# Patient Record
Sex: Male | Born: 1960 | Race: White | Hispanic: No
Health system: Southern US, Community
[De-identification: ages and names within clinical notes are randomized; demographics above are authoritative.]

## PROBLEM LIST (undated history)

## (undated) DIAGNOSIS — I4891 Unspecified atrial fibrillation: Secondary | ICD-10-CM

## (undated) DIAGNOSIS — F17200 Nicotine dependence, unspecified, uncomplicated: Secondary | ICD-10-CM

## (undated) DIAGNOSIS — I10 Essential (primary) hypertension: Secondary | ICD-10-CM

---

## 2019-11-04 ENCOUNTER — Emergency Department (HOSPITAL_COMMUNITY)

## 2019-11-04 ENCOUNTER — Inpatient Hospital Stay (HOSPITAL_COMMUNITY)

## 2019-11-04 ENCOUNTER — Encounter (HOSPITAL_COMMUNITY)
Admission: EM | Disposition: A | Discharge status: Rehabilitation Facility | Payer: Self-pay | Source: Home / Self Care | Attending: Neurology

## 2019-11-04 ENCOUNTER — Emergency Department (HOSPITAL_COMMUNITY): Admitting: Registered Nurse

## 2019-11-04 ENCOUNTER — Encounter (HOSPITAL_COMMUNITY): Payer: Self-pay | Admitting: Emergency Medicine

## 2019-11-04 ENCOUNTER — Other Ambulatory Visit: Payer: Self-pay

## 2019-11-04 ENCOUNTER — Inpatient Hospital Stay (HOSPITAL_COMMUNITY)
Admission: EM | Admit: 2019-11-04 | Discharge: 2019-11-15 | DRG: 023 | Disposition: A | Discharge status: Rehabilitation Facility | Attending: Neurology | Admitting: Neurology

## 2019-11-04 DIAGNOSIS — M549 Dorsalgia, unspecified: Secondary | ICD-10-CM | POA: Diagnosis present

## 2019-11-04 DIAGNOSIS — Z716 Tobacco abuse counseling: Secondary | ICD-10-CM | POA: Diagnosis not present

## 2019-11-04 DIAGNOSIS — G5710 Meralgia paresthetica, unspecified lower limb: Secondary | ICD-10-CM | POA: Diagnosis present

## 2019-11-04 DIAGNOSIS — R471 Dysarthria and anarthria: Secondary | ICD-10-CM | POA: Diagnosis present

## 2019-11-04 DIAGNOSIS — I10 Essential (primary) hypertension: Secondary | ICD-10-CM

## 2019-11-04 DIAGNOSIS — H04123 Dry eye syndrome of bilateral lacrimal glands: Secondary | ICD-10-CM | POA: Diagnosis not present

## 2019-11-04 DIAGNOSIS — Z20822 Contact with and (suspected) exposure to covid-19: Secondary | ICD-10-CM | POA: Diagnosis not present

## 2019-11-04 DIAGNOSIS — I4819 Other persistent atrial fibrillation: Secondary | ICD-10-CM | POA: Diagnosis present

## 2019-11-04 DIAGNOSIS — R29714 NIHSS score 14: Secondary | ICD-10-CM | POA: Diagnosis present

## 2019-11-04 DIAGNOSIS — R531 Weakness: Secondary | ICD-10-CM | POA: Diagnosis not present

## 2019-11-04 DIAGNOSIS — I6389 Other cerebral infarction: Secondary | ICD-10-CM | POA: Diagnosis not present

## 2019-11-04 DIAGNOSIS — F1010 Alcohol abuse, uncomplicated: Secondary | ICD-10-CM | POA: Diagnosis not present

## 2019-11-04 DIAGNOSIS — E7800 Pure hypercholesterolemia, unspecified: Secondary | ICD-10-CM | POA: Diagnosis not present

## 2019-11-04 DIAGNOSIS — R29810 Facial weakness: Secondary | ICD-10-CM | POA: Diagnosis present

## 2019-11-04 DIAGNOSIS — F10239 Alcohol dependence with withdrawal, unspecified: Secondary | ICD-10-CM | POA: Diagnosis not present

## 2019-11-04 DIAGNOSIS — I639 Cerebral infarction, unspecified: Secondary | ICD-10-CM | POA: Diagnosis not present

## 2019-11-04 DIAGNOSIS — I63431 Cerebral infarction due to embolism of right posterior cerebral artery: Principal | ICD-10-CM | POA: Diagnosis present

## 2019-11-04 DIAGNOSIS — E669 Obesity, unspecified: Secondary | ICD-10-CM | POA: Diagnosis present

## 2019-11-04 DIAGNOSIS — I63541 Cerebral infarction due to unspecified occlusion or stenosis of right cerebellar artery: Secondary | ICD-10-CM | POA: Diagnosis not present

## 2019-11-04 DIAGNOSIS — F10129 Alcohol abuse with intoxication, unspecified: Secondary | ICD-10-CM

## 2019-11-04 DIAGNOSIS — I609 Nontraumatic subarachnoid hemorrhage, unspecified: Secondary | ICD-10-CM | POA: Diagnosis not present

## 2019-11-04 DIAGNOSIS — I63531 Cerebral infarction due to unspecified occlusion or stenosis of right posterior cerebral artery: Secondary | ICD-10-CM

## 2019-11-04 DIAGNOSIS — I480 Paroxysmal atrial fibrillation: Secondary | ICD-10-CM | POA: Diagnosis not present

## 2019-11-04 DIAGNOSIS — R1312 Dysphagia, oropharyngeal phase: Secondary | ICD-10-CM | POA: Diagnosis not present

## 2019-11-04 DIAGNOSIS — G8194 Hemiplegia, unspecified affecting left nondominant side: Secondary | ICD-10-CM | POA: Diagnosis present

## 2019-11-04 DIAGNOSIS — R400 Somnolence: Secondary | ICD-10-CM | POA: Diagnosis not present

## 2019-11-04 DIAGNOSIS — R1310 Dysphagia, unspecified: Secondary | ICD-10-CM | POA: Diagnosis not present

## 2019-11-04 DIAGNOSIS — H53462 Homonymous bilateral field defects, left side: Secondary | ICD-10-CM | POA: Diagnosis present

## 2019-11-04 DIAGNOSIS — G9511 Acute infarction of spinal cord (embolic) (nonembolic): Secondary | ICD-10-CM | POA: Diagnosis not present

## 2019-11-04 DIAGNOSIS — I771 Stricture of artery: Secondary | ICD-10-CM | POA: Diagnosis not present

## 2019-11-04 DIAGNOSIS — I63211 Cerebral infarction due to unspecified occlusion or stenosis of right vertebral arteries: Secondary | ICD-10-CM | POA: Diagnosis not present

## 2019-11-04 DIAGNOSIS — I959 Hypotension, unspecified: Secondary | ICD-10-CM | POA: Diagnosis present

## 2019-11-04 DIAGNOSIS — R4781 Slurred speech: Secondary | ICD-10-CM | POA: Diagnosis present

## 2019-11-04 DIAGNOSIS — Z6832 Body mass index (BMI) 32.0-32.9, adult: Secondary | ICD-10-CM

## 2019-11-04 DIAGNOSIS — I9589 Other hypotension: Secondary | ICD-10-CM | POA: Diagnosis not present

## 2019-11-04 DIAGNOSIS — I119 Hypertensive heart disease without heart failure: Secondary | ICD-10-CM | POA: Diagnosis not present

## 2019-11-04 DIAGNOSIS — G8191 Hemiplegia, unspecified affecting right dominant side: Secondary | ICD-10-CM | POA: Diagnosis not present

## 2019-11-04 DIAGNOSIS — H5340 Unspecified visual field defects: Secondary | ICD-10-CM | POA: Diagnosis not present

## 2019-11-04 DIAGNOSIS — F17200 Nicotine dependence, unspecified, uncomplicated: Secondary | ICD-10-CM | POA: Diagnosis not present

## 2019-11-04 DIAGNOSIS — I358 Other nonrheumatic aortic valve disorders: Secondary | ICD-10-CM | POA: Diagnosis present

## 2019-11-04 DIAGNOSIS — I63433 Cerebral infarction due to embolism of bilateral posterior cerebral arteries: Secondary | ICD-10-CM | POA: Diagnosis not present

## 2019-11-04 HISTORY — DX: Unspecified atrial fibrillation: I48.91

## 2019-11-04 HISTORY — PX: IR US GUIDE VASC ACCESS RIGHT: IMG2390

## 2019-11-04 HISTORY — DX: Essential (primary) hypertension: I10

## 2019-11-04 HISTORY — PX: IR CT HEAD LTD: IMG2386

## 2019-11-04 HISTORY — DX: Cerebral infarction, unspecified: I63.9

## 2019-11-04 HISTORY — PX: RADIOLOGY WITH ANESTHESIA: SHX6223

## 2019-11-04 HISTORY — PX: IR PERCUTANEOUS ART THROMBECTOMY/INFUSION INTRACRANIAL INC DIAG ANGIO: IMG6087

## 2019-11-04 LAB — PROTIME-INR
INR: 1 (ref 0.8–1.2)
Prothrombin Time: 13.1 seconds (ref 11.4–15.2)

## 2019-11-04 LAB — GLUCOSE, CAPILLARY
Glucose-Capillary: 135 mg/dL — ABNORMAL HIGH (ref 70–99)
Glucose-Capillary: 142 mg/dL — ABNORMAL HIGH (ref 70–99)
Glucose-Capillary: 122 mg/dL — ABNORMAL HIGH (ref 70–99)

## 2019-11-04 LAB — COMPREHENSIVE METABOLIC PANEL
ALT: 41 U/L (ref 0–44)
AST: 24 U/L (ref 15–41)
Albumin: 3.9 g/dL (ref 3.5–5.0)
Alkaline Phosphatase: 72 U/L (ref 38–126)
Anion gap: 8 (ref 5–15)
BUN: 8 mg/dL (ref 6–20)
CO2: 24 mmol/L (ref 22–32)
Calcium: 9.3 mg/dL (ref 8.9–10.3)
Chloride: 103 mmol/L (ref 98–111)
Creatinine, Ser: 0.85 mg/dL (ref 0.61–1.24)
GFR calc Af Amer: >60 mL/min (ref >60)
GFR calc non Af Amer: >60 mL/min (ref >60)
Glucose, Bld: 112 mg/dL — ABNORMAL HIGH (ref 70–99)
Potassium: 4.4 mmol/L (ref 3.5–5.1)
Sodium: 135 mmol/L (ref 135–145)
Total Bilirubin: 0.8 mg/dL (ref 0.3–1.2)
Total Protein: 7.1 g/dL (ref 6.5–8.1)

## 2019-11-04 LAB — DIFFERENTIAL
Abs Immature Granulocytes: 0.02 10*3/uL (ref 0.00–0.07)
Basophils Absolute: 0 10*3/uL (ref 0.0–0.1)
Basophils Relative: 1 %
Eosinophils Absolute: 0.1 10*3/uL (ref 0.0–0.5)
Eosinophils Relative: 1 %
Immature Granulocytes: 0 %
Lymphocytes Relative: 28 %
Lymphs Abs: 1.6 10*3/uL (ref 0.7–4.0)
Monocytes Absolute: 0.5 10*3/uL (ref 0.1–1.0)
Monocytes Relative: 8 %
Neutro Abs: 3.5 10*3/uL (ref 1.7–7.7)
Neutrophils Relative %: 62 %

## 2019-11-04 LAB — RAPID URINE DRUG SCREEN, HOSP PERFORMED
Amphetamines: NOT DETECTED
Barbiturates: NOT DETECTED
Benzodiazepines: NOT DETECTED
Cocaine: NOT DETECTED
Opiates: NOT DETECTED
Tetrahydrocannabinol: NOT DETECTED

## 2019-11-04 LAB — POCT I-STAT 7, (LYTES, BLD GAS, ICA,H+H)
Acid-base deficit: 2 mmol/L (ref 0.0–2.0)
Bicarbonate: 23.1 mmol/L (ref 20.0–28.0)
Calcium, Ion: 1.25 mmol/L (ref 1.15–1.40)
HCT: 41 % (ref 39.0–52.0)
Hemoglobin: 13.9 g/dL (ref 13.0–17.0)
O2 Saturation: 95 %
Potassium: 4.1 mmol/L (ref 3.5–5.1)
Sodium: 137 mmol/L (ref 135–145)
TCO2: 24 mmol/L (ref 22–32)
pCO2 arterial: 39.5 mmHg (ref 32.0–48.0)
pH, Arterial: 7.374 (ref 7.350–7.450)
pO2, Arterial: 77 mmHg — ABNORMAL LOW (ref 83.0–108.0)

## 2019-11-04 LAB — CBG MONITORING, ED
Glucose-Capillary: 120 mg/dL — ABNORMAL HIGH (ref 70–99)
Glucose-Capillary: 101 mg/dL — ABNORMAL HIGH (ref 70–99)

## 2019-11-04 LAB — RESPIRATORY PANEL BY RT PCR (FLU A&B, COVID)
Influenza A by PCR: NEGATIVE
Influenza B by PCR: NEGATIVE
SARS Coronavirus 2 by RT PCR: NEGATIVE

## 2019-11-04 LAB — CBC
HCT: 47.9 % (ref 39.0–52.0)
Hemoglobin: 16.5 g/dL (ref 13.0–17.0)
MCH: 32.8 pg (ref 26.0–34.0)
MCHC: 34.4 g/dL (ref 30.0–36.0)
MCV: 95.2 fL (ref 80.0–100.0)
Platelets: 231 10*3/uL (ref 150–400)
RBC: 5.03 MIL/uL (ref 4.22–5.81)
RDW: 12.4 % (ref 11.5–15.5)
WBC: 5.6 10*3/uL (ref 4.0–10.5)
nRBC: 0 % (ref 0.0–0.2)

## 2019-11-04 LAB — I-STAT CHEM 8, ED
BUN: 9 mg/dL (ref 6–20)
Calcium, Ion: 1.12 mmol/L — ABNORMAL LOW (ref 1.15–1.40)
Chloride: 103 mmol/L (ref 98–111)
Creatinine, Ser: 0.8 mg/dL (ref 0.61–1.24)
Glucose, Bld: 107 mg/dL — ABNORMAL HIGH (ref 70–99)
HCT: 44 % (ref 39.0–52.0)
Hemoglobin: 15 g/dL (ref 13.0–17.0)
Potassium: 4.3 mmol/L (ref 3.5–5.1)
Sodium: 137 mmol/L (ref 135–145)
TCO2: 24 mmol/L (ref 22–32)

## 2019-11-04 LAB — MRSA PCR SCREENING: MRSA by PCR: NEGATIVE

## 2019-11-04 LAB — APTT: aPTT: 24 seconds (ref 24–36)

## 2019-11-04 SURGERY — IR WITH ANESTHESIA
Anesthesia: General

## 2019-11-04 MED ORDER — PHENYLEPHRINE 40 MCG/ML (10ML) SYRINGE FOR IV PUSH (FOR BLOOD PRESSURE SUPPORT)
PREFILLED_SYRINGE | INTRAVENOUS | Status: DC | PRN
  Administered 2019-11-04 (×2): 80 ug via INTRAVENOUS

## 2019-11-04 MED ORDER — IOHEXOL 240 MG/ML SOLN
150.0000 mL | Freq: Once | INTRAMUSCULAR | Status: AC | PRN
  Administered 2019-11-04: 08:00:00 96 mL via INTRAVENOUS

## 2019-11-04 MED ORDER — PANTOPRAZOLE SODIUM 40 MG IV SOLR
40.0000 mg | INTRAVENOUS | Status: DC
  Administered 2019-11-04: 40 mg via INTRAVENOUS
  Filled 2019-11-04: qty 40

## 2019-11-04 MED ORDER — ACETAMINOPHEN 650 MG RE SUPP: 650.0000 mg | RECTAL | Status: DC | PRN

## 2019-11-04 MED ORDER — LORAZEPAM 2 MG/ML IJ SOLN
1.0000 mg | INTRAMUSCULAR | Status: DC | PRN
  Administered 2019-11-04: 1 mg via INTRAVENOUS
  Administered 2019-11-05: 4 mg via INTRAVENOUS
  Administered 2019-11-05: 15:00:00 0.5 mg via INTRAVENOUS
  Administered 2019-11-05: 1 mg via INTRAVENOUS
  Administered 2019-11-06: 4 mg via INTRAVENOUS
  Administered 2019-11-06 (×4): 2 mg via INTRAVENOUS
  Filled 2019-11-04 (×5): qty 1
  Filled 2019-11-04 (×2): qty 2
  Filled 2019-11-04 (×2): qty 1
  Filled 2019-11-04: qty 2
  Filled 2019-11-04 (×2): qty 1

## 2019-11-04 MED ORDER — PANTOPRAZOLE SODIUM 40 MG PO TBEC
40.0000 mg | DELAYED_RELEASE_TABLET | Freq: Every day | ORAL | Status: DC
  Administered 2019-11-04 – 2019-11-06 (×3): 40 mg via ORAL
  Filled 2019-11-04 (×3): qty 1

## 2019-11-04 MED ORDER — LABETALOL HCL 5 MG/ML IV SOLN
INTRAVENOUS | Status: DC | PRN
  Administered 2019-11-04: 5 mg via INTRAVENOUS

## 2019-11-04 MED ORDER — ONDANSETRON HCL 4 MG/2ML IJ SOLN
INTRAMUSCULAR | Status: DC | PRN
  Administered 2019-11-04: 4 mg via INTRAVENOUS

## 2019-11-04 MED ORDER — CHLORHEXIDINE GLUCONATE CLOTH 2 % EX PADS
6.0000 | MEDICATED_PAD | Freq: Every day | CUTANEOUS | Status: DC
  Administered 2019-11-04 – 2019-11-10 (×7): 6 via TOPICAL

## 2019-11-04 MED ORDER — VERAPAMIL HCL 2.5 MG/ML IV SOLN
INTRAVENOUS | Status: AC
  Filled 2019-11-04: qty 4

## 2019-11-04 MED ORDER — DEXAMETHASONE SODIUM PHOSPHATE 10 MG/ML IJ SOLN
INTRAMUSCULAR | Status: DC | PRN
  Administered 2019-11-04: 4 mg via INTRAVENOUS

## 2019-11-04 MED ORDER — ACETAMINOPHEN 325 MG PO TABS
650.0000 mg | ORAL_TABLET | ORAL | Status: DC | PRN
  Administered 2019-11-04 – 2019-11-15 (×17): 650 mg via ORAL
  Filled 2019-11-04 (×15): qty 2

## 2019-11-04 MED ORDER — PROPOFOL 10 MG/ML IV BOLUS
INTRAVENOUS | Status: DC | PRN
  Administered 2019-11-04: 20 mg via INTRAVENOUS
  Administered 2019-11-04: 180 mg via INTRAVENOUS

## 2019-11-04 MED ORDER — SODIUM CHLORIDE 0.9 % IV SOLN: INTRAVENOUS | Status: DC

## 2019-11-04 MED ORDER — THIAMINE HCL 100 MG/ML IJ SOLN: 100.0000 mg | Freq: Every day | INTRAMUSCULAR | Status: DC

## 2019-11-04 MED ORDER — FENTANYL CITRATE (PF) 250 MCG/5ML IJ SOLN
INTRAMUSCULAR | Status: AC
  Filled 2019-11-04: qty 5

## 2019-11-04 MED ORDER — ADULT MULTIVITAMIN W/MINERALS CH
1.0000 | ORAL_TABLET | Freq: Every day | ORAL | Status: DC
  Administered 2019-11-04 – 2019-11-06 (×3): 1 via ORAL
  Filled 2019-11-04 (×3): qty 1

## 2019-11-04 MED ORDER — LIDOCAINE 2% (20 MG/ML) 5 ML SYRINGE
INTRAMUSCULAR | Status: DC | PRN
  Administered 2019-11-04: 60 mg via INTRAVENOUS

## 2019-11-04 MED ORDER — SODIUM CHLORIDE 0.9% FLUSH
3.0000 mL | Freq: Once | INTRAVENOUS | Status: AC
  Administered 2019-11-13: 21:00:00 3 mL via INTRAVENOUS

## 2019-11-04 MED ORDER — ROCURONIUM BROMIDE 10 MG/ML (PF) SYRINGE
PREFILLED_SYRINGE | INTRAVENOUS | Status: DC | PRN
  Administered 2019-11-04: 30 mg via INTRAVENOUS
  Administered 2019-11-04: 60 mg via INTRAVENOUS

## 2019-11-04 MED ORDER — STROKE: EARLY STAGES OF RECOVERY BOOK: Freq: Once | Status: AC

## 2019-11-04 MED ORDER — IOHEXOL 240 MG/ML SOLN
INTRAMUSCULAR | Status: AC
  Filled 2019-11-04: qty 200

## 2019-11-04 MED ORDER — FENTANYL CITRATE (PF) 250 MCG/5ML IJ SOLN
INTRAMUSCULAR | Status: DC | PRN
  Administered 2019-11-04: 50 ug via INTRAVENOUS
  Administered 2019-11-04: 100 ug via INTRAVENOUS

## 2019-11-04 MED ORDER — SUCCINYLCHOLINE CHLORIDE 200 MG/10ML IV SOSY
PREFILLED_SYRINGE | INTRAVENOUS | Status: DC | PRN
  Administered 2019-11-04: 140 mg via INTRAVENOUS

## 2019-11-04 MED ORDER — THIAMINE HCL 100 MG PO TABS
100.0000 mg | ORAL_TABLET | Freq: Every day | ORAL | Status: DC
  Administered 2019-11-04 – 2019-11-06 (×3): 100 mg via ORAL
  Filled 2019-11-04 (×3): qty 1

## 2019-11-04 MED ORDER — IOHEXOL 350 MG/ML SOLN
125.0000 mL | Freq: Once | INTRAVENOUS | Status: AC | PRN
  Administered 2019-11-04: 125 mL via INTRAVENOUS

## 2019-11-04 MED ORDER — DILTIAZEM HCL-DEXTROSE 125-5 MG/125ML-% IV SOLN (PREMIX)
5.0000 mg/h | INTRAVENOUS | Status: DC
  Filled 2019-11-04 (×2): qty 125

## 2019-11-04 MED ORDER — ACETAMINOPHEN 160 MG/5ML PO SOLN
650.0000 mg | ORAL | Status: DC | PRN
  Administered 2019-11-07 – 2019-11-08 (×4): 650 mg
  Filled 2019-11-04 (×4): qty 20.3

## 2019-11-04 MED ORDER — SENNOSIDES-DOCUSATE SODIUM 8.6-50 MG PO TABS: 1.0000 | ORAL_TABLET | Freq: Every evening | ORAL | Status: DC | PRN

## 2019-11-04 MED ORDER — LORAZEPAM 1 MG PO TABS
1.0000 mg | ORAL_TABLET | ORAL | Status: DC | PRN
  Administered 2019-11-04: 21:00:00 2 mg via ORAL
  Filled 2019-11-04: qty 2

## 2019-11-04 MED ORDER — ALBUMIN HUMAN 5 % IV SOLN: INTRAVENOUS | Status: DC | PRN

## 2019-11-04 MED ORDER — NICOTINE 14 MG/24HR TD PT24
14.0000 mg | MEDICATED_PATCH | Freq: Every day | TRANSDERMAL | Status: DC
  Administered 2019-11-04 – 2019-11-15 (×12): 14 mg via TRANSDERMAL
  Filled 2019-11-04 (×12): qty 1

## 2019-11-04 MED ORDER — FOLIC ACID 1 MG PO TABS
1.0000 mg | ORAL_TABLET | Freq: Every day | ORAL | Status: DC
  Administered 2019-11-04 – 2019-11-06 (×3): 1 mg via ORAL
  Filled 2019-11-04 (×3): qty 1

## 2019-11-04 MED ORDER — LACTATED RINGERS IV SOLN: INTRAVENOUS | Status: DC | PRN

## 2019-11-04 MED ORDER — PHENYLEPHRINE HCL-NACL 10-0.9 MG/250ML-% IV SOLN
INTRAVENOUS | Status: DC | PRN
  Administered 2019-11-04: 45 ug/min via INTRAVENOUS

## 2019-11-04 NOTE — Procedures (Addendum)
INTERVENTIONAL NEURORADIOLOGY BRIEF POSTPROCEDURE NOTE  Nicholas Estes  Attending: Dr. Baldemar Lenis, MD  Assistant: None   Diagnosis: Right P2/PCA occlusion  Access site: Distal right radial artery  Access closure: Inflatable band  Anesthesia: General  Medication used: refer to anesthesia note for sedation medications.  Complications: None  Estimated blood loss: 100 mL  Specimen: None  Findings: Right P2A occlusion. Multiple passes performed (2x solitaire, 3x embotrap, !x aspiration) with minimal recanalization to the level of the P2P segment (TICI2A). No hemorrhagic complication on post procedure flat panel CT.  The patient tolerated the procedure well without incident or complication and is in stable condition.

## 2019-11-04 NOTE — Consult Note (Signed)
Referring Physician: Dr. Gwenyth Bender    Chief Complaint: Acute onset of left hemiplegia, left visual field cut and left sided sensory loss.   HPI: Nicholas Estes is an 59 y.o. male presenting from home with acute onset of dysarthria, left hemiplegia and left facial droop. He states that he got dizzy at about 2 PM on Friday, but that he then returned back to baseline. At about 6:30 PM he experienced sudden onset of left sided numbness and weakness. He waited to call EMS as he thought that his symptoms would improve on their own. EMS was called in the early AM hours and patient was emergently transported to [facility] as a Code Stroke.   LSN: 6:30 PM tPA Given: No: Out of the time window NHSS: 14 >> 12  No past medical history on file.  No family history on file. Social History:  has no history on file for tobacco, alcohol, and drug.  Allergies: Not on File  Home Medications:  Viagra Other medications if any are unknown  ROS: Has vertigo. Denies headache. Has shortness of breath due to anxiety in the context of his current condition. Other ROS as per HPI.   Physical Examination: There were no vitals taken for this visit.  HEENT: [place]/AT Lungs: Tachypneic. No grossly audible wheezing.  Ext: No edema  Neurologic Examination: Mental Status: Alert, oriented to age, month, situation, birthdate. Anxious affect. Speech fluent with intact comprehension and naming. Able to follow/comprehend all motor commands without difficulty. Cranial Nerves: II:  PERRL. Visual field cut on the left. Visual perception is impaired, states he sees 1 finger when 5 are held up.  III,IV, VI: EOMI. No ptosis.  V,VII: No facial droop in CT, but droop on the left was present at the bridge. Decreased temp and FT sensation on the left.  VIII: hearing intact to voice.  IX,X: No hypophonia or hoarseness XI: Decreased on the left XII: midline tongue extension  Motor: RUE and RLE 5/5 LUE 3/5 LLE 4-/5 Sensory: Absent temp  sensation to LUE and LLE Decreased FT to LUE Absent FT and pressure sensation to LLE.  Deep Tendon Reflexes:  Right biceps and brachioradialis 2+ Left biceps and brachioradialis 1+ Bilateral patellae 2+ Right toe downgoing, left toe upgoing.  Cerebellar: No ataxia with FNF on the right. Unable to perform on the left.  Gait: Deferred   Results for orders placed or performed during the hospital encounter of 11/04/19 (from the past 48 hour(s))  CBG monitoring, ED     Status: Abnormal   Collection Time: 11/04/19  3:57 AM  Result Value Ref Range   Glucose-Capillary 120 (H) 70 - 99 mg/dL    Comment: Glucose reference range applies only to samples taken after fasting for at least 8 hours.   No results found.  Assessment: 59 y.o. male presenting with acute onset of left hemiplegia, left visual field cut and left sided sensory loss.  1. Exam localizes as a lesion in the right cerebral hemisphere 2. CT head initial review by Neurologist: Right thalamic early subacute ischemic infarction. No hemorrhage.  3. Out of the tPA time window.  4. May be a candidate for thrombectomy pending CTA with CTP.  5. Stroke Risk Factors - HTN  Plan: 1. STAT CTA/CTP 2. Further decision making pending results of vascular imaging study.    Addendum: 1. CTA reveals a right P1 occlusion.  2. CTP reveals a penumbra of tissue at risk of 31 cc. There is no core on CTP, but the  thalamic hypodensity on plain CT most likely represents a small core.  3. The patient is a candidate for VIR. Risks/benefits discussed with patient and his girlfriend, including approximately 50% chance of significant improvement versus approximately 10% risk of subarachnoid hemorrhage with treatment. If no treatment, then given the duration of his symptoms, the likelihood of a full spontaneous recovery is significantly reduced. After consideration of risks/benefits, the patient has consented to VIR.  4. Following VIR, will be admitted to the  ICU under the Neurology service.  5. Frequent neuro checks, BP management.  6. No anticoagulants for at least 24 hours following the procedure. Can consider starting if repeat CT head at 24 hours is negative for hemorrhagic conversion.  7. Possible antiplatelet agent following procedure to be determined by Dr. Quay Burow.  8. MRI brain.  9. TTE.  10. Cardiac telemetry.  11. PT consult, OT consult, Speech consult  A total of 90 minutes was spent in the emergent neurological evaluation and management of this critically ill patient with acute stroke.   @Electronically  signed: Dr. 11/04/2019, 4:17 AM

## 2019-11-04 NOTE — Code Documentation (Signed)
Responded to Code Stroke called at 0341 for L sided weakness, [id number]. Pt arrived at 0356, NIH originally 14 but improved to 10 while in CT. CT head negative, CTA-LVO to R PCA at prox P2 segment. CTP-31cc prenumbra with no core infarct. TPA not given-pt out of window. Pt taken back to ED room and prepped for IR. IR paged out at 0507. Pt transported to IR intubation bay at 0515 and to IR suite at 0538.

## 2019-11-04 NOTE — Transfer of Care (Signed)
Immediate Anesthesia Transfer of Care Note  Patient: Nicholas Estes  Procedure(s) Performed: IR WITH ANESTHESIA (N/A )  Patient Location: PACU and ICU  Anesthesia Type:General  Level of Consciousness: awake, alert , oriented and patient cooperative  Airway & Oxygen Therapy: Patient Spontanous Breathing and Patient connected to face mask oxygen  Post-op Assessment: Report given to RN, Post -op Vital signs reviewed and stable and Patient moving all extremities X 4  Post vital signs: Reviewed and stable  Last Vitals:  Vitals Value Taken Time  BP 150/97 11/04/19 0815  Temp    Pulse 82 11/04/19 0826  Resp 15 11/04/19 0826  SpO2 95 % 11/04/19 0826  Vitals shown include unvalidated device data.  Last Pain:  Vitals:   11/04/19 0447  TempSrc:   PainSc: 0-No pain         Complications: No apparent anesthesia complications

## 2019-11-04 NOTE — ED Triage Notes (Addendum)
Pt arrives via  EMS for c/o dizziness, numbness and left sided weakness. Upon arrival pt presents with L sided facial droop and minimal movement to L side of the body. EMS states that per family, pt stepped a hole in the yard sometime yesterday evening, he began acting different between 6 and 7PM. Pt alert, able to answer questions-slurred speech present, airway intact upon arrival.   Patient cleared at the bridge by Dr. Erin Hearing, escorted to CT by this RN and Stroke RN Mindy.

## 2019-11-04 NOTE — Progress Notes (Signed)
SLP Cancellation Note  Patient Details Name: Nicholas Estes MRN: [id number] DOB: Sep 09, 1960   Cancelled treatment:       Reason Eval/Treat Not Completed: Medical issues which prohibited therapy. Order received for cognitive-linguistic evaluation. Pt just reached the floor this morning from IR. Also note that [employer] Screen has not been completed yet. Will f/u as able.    Mahala Menghini., M.A. CCC-SLP Acute Rehabilitation Services Pager [phone] Office [phone]  11/04/2019, 9:51 AM

## 2019-11-04 NOTE — Progress Notes (Signed)
STROKE TEAM PROGRESS NOTE   INTERVAL HISTORY His son and girlfriend are at the bedside.  Pt lying in bed, on O2 mask, 5L. RN stated that pt finger nail purple color. Had stat ABG showed no hypercarbia. As per son, pt is current smoker and heavy alcohol drinker - 18 beer daily. Will put on CIWA protocol. Pt complains of back pain and also right arm and leg tingling more than the left, will do MRI stat.    OBJECTIVE Vitals:   11/04/19 0415 11/04/19 0456 11/04/19 0500  BP: (!) 148/108  (!) 153/110  Pulse: 92 93   Resp: 18 17   Temp: 97.6 F (36.4 C)    TempSrc: Oral    SpO2: 98% 94%     CBC:  Recent Labs  Lab 11/04/19 0415 11/04/19 0419  WBC 5.6  --   NEUTROABS 3.5  --   HGB 16.5 15.0  HCT 47.9 44.0  MCV 95.2  --   PLT 231  --     Basic Metabolic Panel:  Recent Labs  Lab 11/04/19 0415 11/04/19 0419  NA 135 137  K 4.4 4.3  CL 103 103  CO2 24  --   GLUCOSE 112* 107*  BUN 8 9  CREATININE 0.85 0.80  CALCIUM 9.3  --     Lipid Panel: No results found for: CHOL, TRIG, HDL, CHOLHDL, VLDL, LDLCALC HgbA1c: No results found for: HGBA1C Urine Drug Screen: No results found for: LABOPIA, COCAINSCRNUR, LABBENZ, AMPHETMU, THCU, LABBARB  Alcohol Level No results found for: [facility]  IMAGING  CT Code Stroke CTA Head W/WO contrast CT Code Stroke CTA Neck W/WO contrast CT Code Stroke Cerebral Perfusion with contrast 11/04/2019 IMPRESSION:  1. 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 No other intracranial arterial occlusion or high-grade stenosis.  2. 31 mL ischemic penumbra in the right occipital lobe without core infarct.   CT HEAD CODE STROKE WO CONTRAST 11/04/2019 IMPRESSION:  1. Normal brain  2. ASPECTS is 10   MRI Brain - pending  Transthoracic Echocardiogram  Pending  ECG - atrial fibrillation - ventricular response 103 BPM (See cardiology reading for complete details)   PHYSICAL EXAM  Temp:  [97.6 F (36.4 C)] 97.6 F (36.4 C) (04/17 0415) Pulse  Rate:  [86-93] 86 (04/17 0900) Resp:  [17-19] 19 (04/17 0900) BP: (119-153)/(52-110) 119/52 (04/17 0900) SpO2:  [94 %-98 %] 94 % (04/17 0456)  General - Well nourished, well developed, lethargic.  Ophthalmologic - fundi not visualized due to noncooperation.  Cardiovascular - irregularly irregular heart rate and rhythm.  Neuro - lethargic on O2 mask, able to answer questions and orientated to people, place and year / month, however told me his age was 59 or 63 attempted several times, he would give the same answer. Mild dysarthria but no aphasia although only spoke short sentences. Not cooperative on naming or repetition. Left hemianopia, not blinking to visual threat on the left. Left facial droop. Tongue protrusion not cooperative. However, LUE 4/5 and RUE 3/5 drift when holding in air. LLE 3/5 and RLE 4/5. On sensation testing, he claimed more tingling on the right than the left. DTR 1+ and no babinski. Coordination not cooperative and gait not tested.    ASSESSMENT/PLAN Nicholas Estes is a 59 y.o. male with history of hypertension presenting with acute onset of dysarthria, left hemiplegia and left facial droop. He did not receive IV t-PA due to late presentation (>4.5 hours from time of onset) IR -  Right P2A occlusion. Multiple passes performed (2x solitaire, 3x embotrap, !x aspiration) with minimal recanalization to the level of the P2P segment (TICI2A).   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 ASPECTS is 10   CTA H&N - Occlusion of the right PCA at the proximal P2 segment.   CT Perfusion - positive for penumbra  IR unsuccessful right P2 thrombectomy  MRI head - bilateral PCA scattered infarcts and medullary infarcts, small right paramesencephalic cistern SAH  CT repeat confirmed small right perimesencephalic cistern SAH  2D Echo -  pending  LE venous Doppler negative for DVT  [employer] Virus 2 - negative  LDL - pending  HgbA1c - pending  UDS - pending  VTE prophylaxis - SCDs  No antithrombotic prior to admission, now on No antithrombotic due to [facility]  Ongoing aggressive stroke risk factor management  Therapy recommendations:  pending  Disposition:  Pending  Atrial Fibrillation - new diagnosis  EKG captured afib  Rate controlled at this time  Will consider metoprolol if RVR  Will consider AC based on MRI finding  Alcohol abuse  Heavy drinker at home as per son  CIWA protocol  Vitamin B1, multivitamin, folic acid  Seizure precaution  Hypertension  Home BP meds: Amlodipine 10  Current BP meds: none  Stable . BP goal less than 160 due to [facility] . Long-term BP goal normotensive  Tobacco abuse  Current smoker  Smoking cessation counseling provided  Nicotine patch provided  Pt is willing to quit  Other Stroke Risk Factors  Obesity, There is no height or weight on file to calculate BMI., recommend weight loss, diet and exercise as appropriate   Family hx stroke - not on file  Other Active Problems  Code status - Full code  Hospital day # 0  This patient is critically ill due to multifocal posterior circulation infarcts, postprocedure SAH, new onset A. fib heavy alcohol user, smoker and at significant risk of neurological worsening, death form recurrent posterior circulation strokes, hemorrhagic conversion, enlargement of SAH, delirium tremor, alcohol withdrawal seizure, heart failure. This patient's care requires constant monitoring of vital signs, hemodynamics, respiratory and cardiac monitoring, review of multiple databases, neurological assessment, discussion with family, other specialists and medical decision making of high complexity. I spent 30 minutes of neurocritical care time in the care of this patient. I had long discussion with son and girlfriend at bedside, updated pt  current condition, treatment plan and potential prognosis, and answered all the questions. They expressed understanding and appreciation.   Rosalin Hawking, MD PhD Stroke Neurology 11/04/2019 7:18 PM    To contact Stroke Continuity provider, please refer to [email or web address] After hours, contact General Neurology

## 2019-11-04 NOTE — Progress Notes (Signed)
VASCULAR LAB PRELIMINARY  PRELIMINARY  PRELIMINARY  PRELIMINARY  Bilateral lower extremity venous duplex completed.    Preliminary report:  See CV proc for preliminary results.   Sujey Gundry, RVT 11/04/2019, 11:04 AM

## 2019-11-04 NOTE — Sedation Documentation (Signed)
Spoke with [facility] in pt placement. Pt to be admitted to 4N15. 4N charge aware. Chief Technology Officer to bring bed to IR.

## 2019-11-04 NOTE — ED Notes (Signed)
Patient transported to IR by this Charity fundraiser and Stroke RN Mindy.

## 2019-11-04 NOTE — Procedures (Signed)
Echo attempted. Nurse stated patient was being take to stat imaging test after getting echo machine into room. Will attempt again later.

## 2019-11-04 NOTE — Anesthesia Preprocedure Evaluation (Signed)
Anesthesia Evaluation  Patient identified by MRN, date of birth, ID band Patient confused    Reviewed: Allergy & Precautions, NPO status , Patient's Chart, lab work & pertinent test results, Unable to perform ROS - Chart review onlyPreop documentation limited or incomplete due to emergent nature of procedure.  History of Anesthesia Complications Negative for: history of anesthetic complications  Airway Mallampati: III       Dental  (+) Dental Advisory Given,    Pulmonary neg recent URI,    breath sounds clear to auscultation       Cardiovascular hypertension, + dysrhythmias Atrial Fibrillation  Rhythm:Irregular Rate:Tachycardia     Neuro/Psych CVA, Residual Symptoms negative psych ROS   GI/Hepatic   Endo/Other    Renal/GU      Musculoskeletal   Abdominal   Peds  Hematology   Anesthesia Other Findings   Reproductive/Obstetrics                             Anesthesia Physical Anesthesia Plan  ASA: II and emergent  Anesthesia Plan: General   Post-op Pain Management:    Induction: Intravenous, Rapid sequence and Cricoid pressure planned  PONV Risk Score and Plan: 2 and Ondansetron and Dexamethasone  Airway Management Planned: Oral ETT  Additional Equipment:   Intra-op Plan:   Post-operative Plan: Possible Post-op intubation/ventilation  Informed Consent:     Only emergency history available and History available from chart only  Plan Discussed with: CRNA and Surgeon  Anesthesia Plan Comments:         Anesthesia Quick Evaluation

## 2019-11-04 NOTE — ED Provider Notes (Signed)
[facility] EMERGENCY DEPARTMENT Provider Note  CSN: [id number] Arrival date & time: 11/04/19 0356  Chief Complaint(s) Code Stroke  HPI Nicholas Estes is a 59 y.o. male who presents to the emergency department as a code stroke by EMS.  Last known normal approximately 6 or 7 PM.  EMS was called out for slurred speech, left facial droop and left hemibody weakness.  Airway is intact, patient taken immediately to CT scan.  Out of the window for TPA currently but may warrant IR intervention.  Remainder of history, ROS, and physical exam limited due to patient's condition (acuity).   Level V Caveat.    HPI  Past Medical History Past Medical History:  Diagnosis Date  . Hypertension    There are no problems to display for this patient.  Home Medication(s) Prior to Admission medications   Not on File                                                                                                                                    Past Surgical History History reviewed. No pertinent surgical history. Family History No family history on file.  Social History Social History   Tobacco Use  . Smoking status: Not on file  Substance Use Topics  . Alcohol use: Not on file  . Drug use: Not on file   Allergies Patient has no allergy information on record.  Review of Systems Review of Systems All other systems are reviewed and are negative for acute change except as noted in the HPI  Physical Exam Vital Signs  I have reviewed the triage vital signs BP (!) 153/110   Pulse 93   Temp 97.6 F (36.4 C) (Oral)   Resp 17   SpO2 94%   Physical Exam Vitals reviewed.  Constitutional:      General: He is not in acute distress.    Appearance: He is well-developed. He is not diaphoretic.  HENT:     Head: Normocephalic and atraumatic.     Jaw: No trismus.     Right Ear: External ear normal.     Left Ear: External ear normal.     Nose: Nose normal.  Eyes:      General: No scleral icterus.    Conjunctiva/sclera: Conjunctivae normal.  Neck:     Trachea: Phonation normal.  Cardiovascular:     Rate and Rhythm: Normal rate and regular rhythm.  Pulmonary:     Effort: Pulmonary effort is normal. No respiratory distress.     Breath sounds: No stridor.  Abdominal:     General: There is no distension.  Musculoskeletal:        General: Normal range of motion.     Cervical back: Normal range of motion.  Neurological:     Mental Status: He is alert and oriented to person, place, and time.     Comments: Speech is slurred Left facial droop  Minimal movement in left hemibody Detailed neuro exam by neurology.  Psychiatric:        Behavior: Behavior normal.     ED Results and Treatments Labs (all labs ordered are listed, but only abnormal results are displayed) Labs Reviewed  COMPREHENSIVE METABOLIC PANEL - Abnormal; Notable for the following components:      Result Value   Glucose, Bld 112 (*)    All other components within normal limits  I-STAT CHEM 8, ED - Abnormal; Notable for the following components:   Glucose, Bld 107 (*)    Calcium, Ion 1.12 (*)    All other components within normal limits  CBG MONITORING, ED - Abnormal; Notable for the following components:   Glucose-Capillary 120 (*)    All other components within normal limits  CBG MONITORING, ED - Abnormal; Notable for the following components:   Glucose-Capillary 101 (*)    All other components within normal limits  RESPIRATORY PANEL BY RT PCR (FLU A&B, COVID)  PROTIME-INR  APTT  CBC  DIFFERENTIAL                                                                                                                         EKG  EKG Interpretation  Date/Time:    Ventricular Rate:    PR Interval:    QRS Duration:   QT Interval:    QTC Calculation:   R Axis:     Text Interpretation:        Radiology CT Code Stroke CTA Head W/WO contrast  Result Date: 11/04/2019 CLINICAL  DATA:  Visual field cut. EXAM: CT ANGIOGRAPHY HEAD AND NECK CT PERFUSION BRAIN TECHNIQUE: Multidetector CT imaging of the head and neck was performed using the standard protocol during bolus administration of intravenous contrast. Multiplanar CT image reconstructions and MIPs were obtained to evaluate the vascular anatomy. Carotid stenosis measurements (when applicable) are obtained utilizing NASCET criteria, using the distal internal carotid diameter as the denominator. Multiphase CT imaging of the brain was performed following IV bolus contrast injection. Subsequent parametric perfusion maps were calculated using RAPID software. CONTRAST:  138mL OMNIPAQUE IOHEXOL 350 MG/ML SOLN COMPARISON:  None. FINDINGS: CTA NECK FINDINGS SKELETON: There is no bony spinal canal stenosis. No lytic or blastic lesion. OTHER NECK: Normal pharynx, larynx and major salivary glands. No cervical lymphadenopathy. Unremarkable thyroid gland. UPPER CHEST: No pneumothorax or pleural effusion. No nodules or masses. AORTIC ARCH: There is no calcific atherosclerosis of the aortic arch. There is no aneurysm, dissection or hemodynamically significant stenosis of the visualized portion of the aorta. Conventional 3 vessel aortic branching pattern. The visualized proximal subclavian arteries are widely patent. RIGHT CAROTID SYSTEM: Normal without aneurysm, dissection or stenosis. LEFT CAROTID SYSTEM: Normal without aneurysm, dissection or stenosis. VERTEBRAL ARTERIES: Left dominant configuration. Both origins are clearly patent. There is no dissection, occlusion or flow-limiting stenosis to the skull base (V1-V3 segments). CTA HEAD FINDINGS POSTERIOR CIRCULATION: --Vertebral arteries: Normal V4 segments. --Posterior inferior cerebellar arteries (PICA):  Patent origins from the vertebral arteries. --Anterior inferior cerebellar arteries (AICA): Patent origins from the basilar artery. --Basilar artery: Normal. --Superior cerebellar arteries: Normal.  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 Normal left. ANTERIOR CIRCULATION: --Intracranial internal carotid arteries: Normal. --Anterior cerebral arteries (ACA): Normal. Both A1 segments are present. Patent anterior communicating artery (a-comm). --Middle cerebral arteries (MCA): Normal. VENOUS SINUSES: As permitted by contrast timing, patent. ANATOMIC VARIANTS: None Review of the MIP images confirms the above findings. CT Brain Perfusion Findings: ASPECTS: On further review of the earlier noncontrast head CT images, ASPECTS is downgraded to 9 based on the presence hypoattenuation in the right thalamus. CBF (<30%) Volume: 0mL Perfusion (Tmax>6.0s) volume: 31mL Mismatch Volume: 31mL Infarction Location:No core infarct. The area of ischemic penumbra is in the right occipital lobe. IMPRESSION: 1. 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 No other intracranial arterial occlusion or high-grade stenosis. Critical Value/emergent results were called by telephone at the time of interpretation on 11/04/2019 at 4:43 am to provider [facility] , who verbally acknowledged these results. 2. 31 mL ischemic penumbra in the right occipital lobe without core infarct. Electronically Signed   By: Deatra RobinsonKevin  Herman M.D.   On: 11/04/2019 04:49   CT Code Stroke CTA Neck W/WO contrast  Result Date: 11/04/2019 CLINICAL DATA:  Visual field cut. EXAM: CT ANGIOGRAPHY HEAD AND NECK CT PERFUSION BRAIN TECHNIQUE: Multidetector CT imaging of the head and neck was performed using the standard protocol during bolus administration of intravenous contrast. Multiplanar CT image reconstructions and MIPs were obtained to evaluate the vascular anatomy. Carotid stenosis measurements (when applicable) are obtained utilizing NASCET criteria, using the distal internal carotid diameter as the denominator. Multiphase CT imaging of the brain was performed following IV bolus contrast injection. Subsequent  parametric perfusion maps were calculated using RAPID software. CONTRAST:  125mL OMNIPAQUE IOHEXOL 350 MG/ML SOLN COMPARISON:  None. FINDINGS: CTA NECK FINDINGS SKELETON: There is no bony spinal canal stenosis. No lytic or blastic lesion. OTHER NECK: Normal pharynx, larynx and major salivary glands. No cervical lymphadenopathy. Unremarkable thyroid gland. UPPER CHEST: No pneumothorax or pleural effusion. No nodules or masses. AORTIC ARCH: There is no calcific atherosclerosis of the aortic arch. There is no aneurysm, dissection or hemodynamically significant stenosis of the visualized portion of the aorta. Conventional 3 vessel aortic branching pattern. The visualized proximal subclavian arteries are widely patent. RIGHT CAROTID SYSTEM: Normal without aneurysm, dissection or stenosis. LEFT CAROTID SYSTEM: Normal without aneurysm, dissection or stenosis. VERTEBRAL ARTERIES: Left dominant configuration. Both origins are clearly patent. There is no dissection, occlusion or flow-limiting stenosis to the skull base (V1-V3 segments). CTA HEAD FINDINGS POSTERIOR CIRCULATION: --Vertebral arteries: Normal V4 segments. --Posterior inferior cerebellar arteries (PICA): Patent origins from the vertebral arteries. --Anterior inferior cerebellar arteries (AICA): Patent origins from the basilar artery. --Basilar artery: Normal. --Superior cerebellar arteries: Normal. 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 Normal left. ANTERIOR CIRCULATION: --Intracranial internal carotid arteries: Normal. --Anterior cerebral arteries (ACA): Normal. Both A1 segments are present. Patent anterior communicating artery (a-comm). --Middle cerebral arteries (MCA): Normal. VENOUS SINUSES: As permitted by contrast timing, patent. ANATOMIC VARIANTS: None Review of the MIP images confirms the above findings. CT Brain Perfusion Findings: ASPECTS: On further review of the earlier noncontrast head CT images, ASPECTS  is downgraded to 9 based on the presence hypoattenuation in the right thalamus. CBF (<30%) Volume: 0mL Perfusion (Tmax>6.0s) volume: 31mL Mismatch Volume: 31mL Infarction  Location:No core infarct. The area of ischemic penumbra is in the right occipital lobe. IMPRESSION: 1. 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 No other intracranial arterial occlusion or high-grade stenosis. Critical Value/emergent results were called by telephone at the time of interpretation on 11/04/2019 at 4:43 am to provider ERIC [facility] , who verbally acknowledged these results. 2. 31 mL ischemic penumbra in the right occipital lobe without core infarct. Electronically Signed   By: Deatra Robinson M.D.   On: 11/04/2019 04:49   CT Code Stroke Cerebral Perfusion with contrast  Result Date: 11/04/2019 CLINICAL DATA:  Visual field cut. EXAM: CT ANGIOGRAPHY HEAD AND NECK CT PERFUSION BRAIN TECHNIQUE: Multidetector CT imaging of the head and neck was performed using the standard protocol during bolus administration of intravenous contrast. Multiplanar CT image reconstructions and MIPs were obtained to evaluate the vascular anatomy. Carotid stenosis measurements (when applicable) are obtained utilizing NASCET criteria, using the distal internal carotid diameter as the denominator. Multiphase CT imaging of the brain was performed following IV bolus contrast injection. Subsequent parametric perfusion maps were calculated using RAPID software. CONTRAST:  OMNIPAQUE IOHEXOL 350 MG/ML SOLN COMPARISON:  None. FINDINGS: CTA NECK FINDINGS SKELETON: There is no bony spinal canal stenosis. No lytic or blastic lesion. OTHER NECK: Normal pharynx, larynx and major salivary glands. No cervical lymphadenopathy. Unremarkable thyroid gland. UPPER CHEST: No pneumothorax or pleural effusion. No nodules or masses. AORTIC ARCH: There is no calcific atherosclerosis of the aortic arch. There is no aneurysm, dissection or hemodynamically significant  stenosis of the visualized portion of the aorta. Conventional 3 vessel aortic branching pattern. The visualized proximal subclavian arteries are widely patent. RIGHT CAROTID SYSTEM: Normal without aneurysm, dissection or stenosis. LEFT CAROTID SYSTEM: Normal without aneurysm, dissection or stenosis. VERTEBRAL ARTERIES: Left dominant configuration. Both origins are clearly patent. There is no dissection, occlusion or flow-limiting stenosis to the skull base (V1-V3 segments). CTA HEAD FINDINGS POSTERIOR CIRCULATION: --Vertebral arteries: Normal V4 segments. --Posterior inferior cerebellar arteries (PICA): Patent origins from the vertebral arteries. --Anterior inferior cerebellar arteries (AICA): Patent origins from the basilar artery. --Basilar artery: Normal. --Superior cerebellar arteries: Normal. 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 Normal left. ANTERIOR CIRCULATION: --Intracranial internal carotid arteries: Normal. --Anterior cerebral arteries (ACA): Normal. Both A1 segments are present. Patent anterior communicating artery (a-comm). --Middle cerebral arteries (MCA): Normal. VENOUS SINUSES: As permitted by contrast timing, patent. ANATOMIC VARIANTS: None Review of the MIP images confirms the above findings. CT Brain Perfusion Findings: ASPECTS: On further review of the earlier noncontrast head CT images, ASPECTS is downgraded to 9 based on the presence hypoattenuation in the right thalamus. CBF (<30%) Volume: 39mL Perfusion (Tmax>6.0s) volume: 64mL Mismatch Volume: 77mL Infarction Location:No core infarct. The area of ischemic penumbra is in the right occipital lobe. IMPRESSION: 1. 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 No other intracranial arterial occlusion or high-grade stenosis. Critical Value/emergent results were called by telephone at the time of interpretation on 11/04/2019 at 4:43 am to provider ERIC [facility] , who verbally acknowledged these  results. 2. 31 mL ischemic penumbra in the right occipital lobe without core infarct. Electronically Signed   By: Deatra Robinson M.D.   On: 11/04/2019 04:49   CT HEAD CODE STROKE WO CONTRAST  Result Date: 11/04/2019 CLINICAL DATA:  Code stroke.  Slurred speech and left-sided numbness EXAM: CT HEAD WITHOUT CONTRAST TECHNIQUE: Contiguous axial images were obtained from the base of the skull  through the vertex without intravenous contrast. COMPARISON:  None. FINDINGS: Brain: There is no mass, hemorrhage or extra-axial collection. The size and configuration of the ventricles and extra-axial CSF spaces are normal. The brain parenchyma is normal, without evidence of acute or chronic infarction. Vascular: No abnormal hyperdensity of the major intracranial arteries or dural venous sinuses. No intracranial atherosclerosis. Skull: The visualized skull base, calvarium and extracranial soft tissues are normal. Sinuses/Orbits: No fluid levels or advanced mucosal thickening of the visualized paranasal sinuses. No mastoid or middle ear effusion. The orbits are normal. ASPECTS [facility] Stroke Program Early CT Score) - Ganglionic level infarction (caudate, lentiform nuclei, internal capsule, insula, M1-M3 cortex): 7 - Supraganglionic infarction (M4-M6 cortex): 3 Total score (0-10 with 10 being normal): 10 IMPRESSION: 1. Normal brain 2. ASPECTS is 10 3. These results were communicated to Dr. Caryl Pina at 4:16 am on 11/04/2019 by text page via the [facility] messaging system. Electronically Signed   By: Deatra Robinson M.D.   On: 11/04/2019 04:17    Pertinent labs & imaging results that were available during my care of the patient were reviewed by me and considered in my medical decision making (see chart for details).  Medications Ordered in ED Medications  sodium chloride flush (NS) 0.9 % injection 3 mL (3 mLs Intravenous Not Given 11/04/19 0459)  fentaNYL citrate (PF) (SUBLIMAZE) 250 MCG/5ML injection (has no administration in  time range)  iohexol (OMNIPAQUE) 350 MG/ML injection 125 mL (125 mLs Intravenous Contrast Given 11/04/19 0436)                                                                                                                                    Procedures Procedures  (including critical care time)  Medical Decision Making / ED Course I have reviewed the nursing notes for this encounter and the patient's prior records (if available in EHR or on provided paperwork).   Nicholas Estes was evaluated in Emergency Department on 11/04/2019 for the symptoms described in the history of present illness. He was evaluated in the context of the global COVID-19 pandemic, which necessitated consideration that the patient might be at risk for infection with the SARS-CoV-2 virus that causes COVID-19. Institutional protocols and algorithms that pertain to the evaluation of patients at risk for COVID-19 are in a state of rapid change based on information released by regulatory bodies including the CDC and federal and state organizations. These policies and algorithms were followed during the patient's care in the ED.  Patient taken to IR for intervention.  Admitted by neurology      Final Clinical Impression(s) / ED Diagnoses Final diagnoses:  Left-sided weakness  Slurred speech      This chart was dictated using voice recognition software.  Despite best efforts to proofread,  errors can occur which can change the documentation meaning.   Nira Conn, MD 11/04/19 [phone]

## 2019-11-04 NOTE — Anesthesia Procedure Notes (Signed)
Procedure Name: Intubation Date/Time: 11/04/2019 5:37 AM Performed by: Zollie Scale, CRNA Pre-anesthesia Checklist: Patient identified, Emergency Drugs available, Suction available and Patient being monitored Patient Re-evaluated:Patient Re-evaluated prior to induction Oxygen Delivery Method: Circle System Utilized Preoxygenation: Pre-oxygenation with 100% oxygen Induction Type: IV induction, Rapid sequence and Cricoid Pressure applied Laryngoscope Size: Glidescope and 4 Grade View: Grade I Tube type: Oral Tube size: 7.5 mm Number of attempts: 1 Airway Equipment and Method: Stylet and Video-laryngoscopy Placement Confirmation: ETT inserted through vocal cords under direct vision,  breath sounds checked- equal and bilateral and CO2 detector Secured at: 23 cm Tube secured with: Tape Dental Injury: Teeth and Oropharynx as per pre-operative assessment  Comments: Glidescope utilized d/t unknown COVID status.

## 2019-11-04 NOTE — Sedation Documentation (Signed)
TR Band assessed at beside upon arrival to 4N15. No changes, see flowsheet. SBAR given to [employer], Charity fundraiser. All questions answered to satisfaction.

## 2019-11-04 NOTE — ED Notes (Signed)
Patient and SO requested a few minutes alone to discuss treatment options and make a decision.

## 2019-11-04 NOTE — Progress Notes (Signed)
Referring Physician(s): Code Stroke- Caryl PinaLindzen, Eric  Supervising Physician: Baldemar Lenise Macedo Rodrigues, Katyucia  Patient Status:  [facility] - In[facility]pt  Chief Complaint: None  Subjective:  History of CVA s/p cerebral arteriogram with emergent mechanical thrombectomy of right PCA P2 occlusion achieving a TICI 2A revascularization 11/04/2019 by Dr. Tommie Samsde Macedo Rodrigues. Patient awake and alert laying in bed with no complaints at this time. Accompanied by wife at bedside. Can spontaneously move all extremities with weakness of left side. Left pronator drift. Right radial incision c/d/i with TR band in place.   Allergies: Patient has no known allergies.  Medications: Prior to Admission medications   Medication Sig Start Date End Date Taking? Authorizing Provider  amLODipine (NORVASC) 10 MG tablet Take 10 mg by mouth daily.   Yes [provider]     Vital Signs: BP (!) 119/52   Pulse 86   Temp 97.6 F (36.4 C) (Oral)   Resp 19   SpO2 94%   Physical Exam Vitals and nursing note reviewed.  Constitutional:      General: He is not in acute distress.    Appearance: Normal appearance.  Pulmonary:     Effort: Pulmonary effort is normal. No respiratory distress.  Skin:    General: Skin is warm and dry.     Comments: Right radial incision soft with TR band in place, no active bleeding or hematoma.  Neurological:     Mental Status: He is alert.     Comments: Alert, awake, and oriented x3. Speech and comprehension intact. PERRL bilaterally. can spontaneously move all extremities with weakness of left side Left pronator drift. Distal pulses (radial) 2+ bilaterally.     Imaging: CT Code Stroke CTA Head W/WO contrast  Result Date: 11/04/2019 CLINICAL DATA:  Visual field cut. EXAM: CT ANGIOGRAPHY HEAD AND NECK CT PERFUSION BRAIN TECHNIQUE: Multidetector CT imaging of the head and neck was performed using the standard protocol during bolus administration of intravenous contrast.  Multiplanar CT image reconstructions and MIPs were obtained to evaluate the vascular anatomy. Carotid stenosis measurements (when applicable) are obtained utilizing NASCET criteria, using the distal internal carotid diameter as the denominator. Multiphase CT imaging of the brain was performed following IV bolus contrast injection. Subsequent parametric perfusion maps were calculated using RAPID software. CONTRAST:  125mL OMNIPAQUE IOHEXOL 350 MG/ML SOLN COMPARISON:  None. FINDINGS: CTA NECK FINDINGS SKELETON: There is no bony spinal canal stenosis. No lytic or blastic lesion. OTHER NECK: Normal pharynx, larynx and major salivary glands. No cervical lymphadenopathy. Unremarkable thyroid gland. UPPER CHEST: No pneumothorax or pleural effusion. No nodules or masses. AORTIC ARCH: There is no calcific atherosclerosis of the aortic arch. There is no aneurysm, dissection or hemodynamically significant stenosis of the visualized portion of the aorta. Conventional 3 vessel aortic branching pattern. The visualized proximal subclavian arteries are widely patent. RIGHT CAROTID SYSTEM: Normal without aneurysm, dissection or stenosis. LEFT CAROTID SYSTEM: Normal without aneurysm, dissection or stenosis. VERTEBRAL ARTERIES: Left dominant configuration. Both origins are clearly patent. There is no dissection, occlusion or flow-limiting stenosis to the skull base (V1-V3 segments). CTA HEAD FINDINGS POSTERIOR CIRCULATION: --Vertebral arteries: Normal V4 segments. --Posterior inferior cerebellar arteries (PICA): Patent origins from the vertebral arteries. --Anterior inferior cerebellar arteries (AICA): Patent origins from the basilar artery. --Basilar artery: Normal. --Superior cerebellar arteries: Normal. 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 Normal left. ANTERIOR CIRCULATION: --Intracranial internal carotid arteries: Normal. --Anterior cerebral arteries (ACA): Normal. Both A1 segments  are  present. Patent anterior communicating artery (a-comm). --Middle cerebral arteries (MCA): Normal. VENOUS SINUSES: As permitted by contrast timing, patent. ANATOMIC VARIANTS: None Review of the MIP images confirms the above findings. CT Brain Perfusion Findings: ASPECTS: On further review of the earlier noncontrast head CT images, ASPECTS is downgraded to 9 based on the presence hypoattenuation in the right thalamus. CBF (<30%) Volume: 65mL Perfusion (Tmax>6.0s) volume: 36mL Mismatch Volume: 70mL Infarction Location:No core infarct. The area of ischemic penumbra is in the right occipital lobe. IMPRESSION: 1. 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 No other intracranial arterial occlusion or high-grade stenosis. Critical Value/emergent results were called by telephone at the time of interpretation on 11/04/2019 at 4:43 am to provider ERIC [facility] , who verbally acknowledged these results. 2. 31 mL ischemic penumbra in the right occipital lobe without core infarct. Electronically Signed   By: Deatra Robinson M.D.   On: 11/04/2019 04:49   CT Code Stroke CTA Neck W/WO contrast  Result Date: 11/04/2019 CLINICAL DATA:  Visual field cut. EXAM: CT ANGIOGRAPHY HEAD AND NECK CT PERFUSION BRAIN TECHNIQUE: Multidetector CT imaging of the head and neck was performed using the standard protocol during bolus administration of intravenous contrast. Multiplanar CT image reconstructions and MIPs were obtained to evaluate the vascular anatomy. Carotid stenosis measurements (when applicable) are obtained utilizing NASCET criteria, using the distal internal carotid diameter as the denominator. Multiphase CT imaging of the brain was performed following IV bolus contrast injection. Subsequent parametric perfusion maps were calculated using RAPID software. CONTRAST:  OMNIPAQUE IOHEXOL 350 MG/ML SOLN COMPARISON:  None. FINDINGS: CTA NECK FINDINGS SKELETON: There is no bony spinal canal stenosis. No lytic or blastic  lesion. OTHER NECK: Normal pharynx, larynx and major salivary glands. No cervical lymphadenopathy. Unremarkable thyroid gland. UPPER CHEST: No pneumothorax or pleural effusion. No nodules or masses. AORTIC ARCH: There is no calcific atherosclerosis of the aortic arch. There is no aneurysm, dissection or hemodynamically significant stenosis of the visualized portion of the aorta. Conventional 3 vessel aortic branching pattern. The visualized proximal subclavian arteries are widely patent. RIGHT CAROTID SYSTEM: Normal without aneurysm, dissection or stenosis. LEFT CAROTID SYSTEM: Normal without aneurysm, dissection or stenosis. VERTEBRAL ARTERIES: Left dominant configuration. Both origins are clearly patent. There is no dissection, occlusion or flow-limiting stenosis to the skull base (V1-V3 segments). CTA HEAD FINDINGS POSTERIOR CIRCULATION: --Vertebral arteries: Normal V4 segments. --Posterior inferior cerebellar arteries (PICA): Patent origins from the vertebral arteries. --Anterior inferior cerebellar arteries (AICA): Patent origins from the basilar artery. --Basilar artery: Normal. --Superior cerebellar arteries: Normal. 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 Normal left. ANTERIOR CIRCULATION: --Intracranial internal carotid arteries: Normal. --Anterior cerebral arteries (ACA): Normal. Both A1 segments are present. Patent anterior communicating artery (a-comm). --Middle cerebral arteries (MCA): Normal. VENOUS SINUSES: As permitted by contrast timing, patent. ANATOMIC VARIANTS: None Review of the MIP images confirms the above findings. CT Brain Perfusion Findings: ASPECTS: On further review of the earlier noncontrast head CT images, ASPECTS is downgraded to 9 based on the presence hypoattenuation in the right thalamus. CBF (<30%) Volume: 68mL Perfusion (Tmax>6.0s) volume: 106mL Mismatch Volume: 30mL Infarction Location:No core infarct. The area of ischemic penumbra is  in the right occipital lobe. IMPRESSION: 1. 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 No other intracranial arterial occlusion or high-grade stenosis. Critical Value/emergent results were called by telephone at the time of interpretation on 11/04/2019 at 4:43 am to provider ERIC LINDZEN ,  who verbally acknowledged these results. 2. 31 mL ischemic penumbra in the right occipital lobe without core infarct. Electronically Signed   By: Ulyses Jarred M.D.   On: 11/04/2019 04:49   CT Code Stroke Cerebral Perfusion with contrast  Result Date: 11/04/2019 CLINICAL DATA:  Visual field cut. EXAM: CT ANGIOGRAPHY HEAD AND NECK CT PERFUSION BRAIN TECHNIQUE: Multidetector CT imaging of the head and neck was performed using the standard protocol during bolus administration of intravenous contrast. Multiplanar CT image reconstructions and MIPs were obtained to evaluate the vascular anatomy. Carotid stenosis measurements (when applicable) are obtained utilizing NASCET criteria, using the distal internal carotid diameter as the denominator. Multiphase CT imaging of the brain was performed following IV bolus contrast injection. Subsequent parametric perfusion maps were calculated using RAPID software. CONTRAST:  118mL OMNIPAQUE IOHEXOL 350 MG/ML SOLN COMPARISON:  None. FINDINGS: CTA NECK FINDINGS SKELETON: There is no bony spinal canal stenosis. No lytic or blastic lesion. OTHER NECK: Normal pharynx, larynx and major salivary glands. No cervical lymphadenopathy. Unremarkable thyroid gland. UPPER CHEST: No pneumothorax or pleural effusion. No nodules or masses. AORTIC ARCH: There is no calcific atherosclerosis of the aortic arch. There is no aneurysm, dissection or hemodynamically significant stenosis of the visualized portion of the aorta. Conventional 3 vessel aortic branching pattern. The visualized proximal subclavian arteries are widely patent. RIGHT CAROTID SYSTEM: Normal without aneurysm, dissection or stenosis.  LEFT CAROTID SYSTEM: Normal without aneurysm, dissection or stenosis. VERTEBRAL ARTERIES: Left dominant configuration. Both origins are clearly patent. There is no dissection, occlusion or flow-limiting stenosis to the skull base (V1-V3 segments). CTA HEAD FINDINGS POSTERIOR CIRCULATION: --Vertebral arteries: Normal V4 segments. --Posterior inferior cerebellar arteries (PICA): Patent origins from the vertebral arteries. --Anterior inferior cerebellar arteries (AICA): Patent origins from the basilar artery. --Basilar artery: Normal. --Superior cerebellar arteries: Normal. 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 Normal left. ANTERIOR CIRCULATION: --Intracranial internal carotid arteries: Normal. --Anterior cerebral arteries (ACA): Normal. Both A1 segments are present. Patent anterior communicating artery (a-comm). --Middle cerebral arteries (MCA): Normal. VENOUS SINUSES: As permitted by contrast timing, patent. ANATOMIC VARIANTS: None Review of the MIP images confirms the above findings. CT Brain Perfusion Findings: ASPECTS: On further review of the earlier noncontrast head CT images, ASPECTS is downgraded to 9 based on the presence hypoattenuation in the right thalamus. CBF (<30%) Volume: 64mL Perfusion (Tmax>6.0s) volume: 70mL Mismatch Volume: 21mL Infarction Location:No core infarct. The area of ischemic penumbra is in the right occipital lobe. IMPRESSION: 1. 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 No other intracranial arterial occlusion or high-grade stenosis. Critical Value/emergent results were called by telephone at the time of interpretation on 11/04/2019 at 4:43 am to provider ERIC [facility] , who verbally acknowledged these results. 2. 31 mL ischemic penumbra in the right occipital lobe without core infarct. Electronically Signed   By: Ulyses Jarred M.D.   On: 11/04/2019 04:49   CT HEAD CODE STROKE WO CONTRAST  Result Date: 11/04/2019 CLINICAL  DATA:  Code stroke.  Slurred speech and left-sided numbness EXAM: CT HEAD WITHOUT CONTRAST TECHNIQUE: Contiguous axial images were obtained from the base of the skull through the vertex without intravenous contrast. COMPARISON:  None. FINDINGS: Brain: There is no mass, hemorrhage or extra-axial collection. The size and configuration of the ventricles and extra-axial CSF spaces are normal. The brain parenchyma is normal, without evidence of acute or chronic infarction. Vascular: No abnormal hyperdensity of the major intracranial arteries or dural venous  sinuses. No intracranial atherosclerosis. Skull: The visualized skull base, calvarium and extracranial soft tissues are normal. Sinuses/Orbits: No fluid levels or advanced mucosal thickening of the visualized paranasal sinuses. No mastoid or middle ear effusion. The orbits are normal. ASPECTS [facility] Stroke Program Early CT Score) - Ganglionic level infarction (caudate, lentiform nuclei, internal capsule, insula, M1-M3 cortex): 7 - Supraganglionic infarction (M4-M6 cortex): 3 Total score (0-10 with 10 being normal): 10 IMPRESSION: 1. Normal brain 2. ASPECTS is 10 3. These results were communicated to Dr. Caryl Pina at 4:16 am on 11/04/2019 by text page via the [facility] messaging system. Electronically Signed   By: Deatra Robinson M.D.   On: 11/04/2019 04:17   VAS [place] LOWER EXTREMITY VENOUS (DVT)  Result Date: 11/04/2019  Lower Venous DVTStudy Indications: Stroke.  Limitations: Movement. Comparison Study: No prior study on file Performing Technologist: Sherren Kerns RVS  Examination Guidelines: A complete evaluation includes B-mode imaging, spectral Doppler, color Doppler, and power Doppler as needed of all accessible portions of each vessel. Bilateral testing is considered an integral part of a complete examination. Limited examinations for reoccurring indications may be performed as noted. The reflux portion of the exam is performed with the patient in reverse  Trendelenburg.  +---------+---------------+---------+-----------+----------+--------------+ RIGHT    CompressibilityPhasicitySpontaneityPropertiesThrombus Aging +---------+---------------+---------+-----------+----------+--------------+ CFV      Full           Yes      Yes                                 +---------+---------------+---------+-----------+----------+--------------+ SFJ      Full                                                        +---------+---------------+---------+-----------+----------+--------------+ FV Prox  Full                                                        +---------+---------------+---------+-----------+----------+--------------+ FV Mid   Full                                                        +---------+---------------+---------+-----------+----------+--------------+ FV DistalFull                                                        +---------+---------------+---------+-----------+----------+--------------+ PFV      Full                                                        +---------+---------------+---------+-----------+----------+--------------+ POP      Full           Yes  Yes                                 +---------+---------------+---------+-----------+----------+--------------+ PTV      Full                                                        +---------+---------------+---------+-----------+----------+--------------+ PERO     Full                                                        +---------+---------------+---------+-----------+----------+--------------+   +---------+---------------+---------+-----------+----------+--------------+ LEFT     CompressibilityPhasicitySpontaneityPropertiesThrombus Aging +---------+---------------+---------+-----------+----------+--------------+ CFV      Full           Yes      Yes                                  +---------+---------------+---------+-----------+----------+--------------+ SFJ      Full                                                        +---------+---------------+---------+-----------+----------+--------------+ FV Prox  Full                                                        +---------+---------------+---------+-----------+----------+--------------+ FV Mid   Full                                                        +---------+---------------+---------+-----------+----------+--------------+ FV DistalFull                                                        +---------+---------------+---------+-----------+----------+--------------+ PFV      Full                                                        +---------+---------------+---------+-----------+----------+--------------+ POP      Full           Yes      Yes                                 +---------+---------------+---------+-----------+----------+--------------+ PTV      Full                                                        +---------+---------------+---------+-----------+----------+--------------+  PERO     Full                                                        +---------+---------------+---------+-----------+----------+--------------+     Summary: BILATERAL: - No evidence of deep vein thrombosis seen in the lower extremities, bilaterally.   *See table(s) above for measurements and observations.    Preliminary     Labs:  CBC: Recent Labs    11/04/19 0415 11/04/19 0419  WBC 5.6  --   HGB 16.5 15.0  HCT 47.9 44.0  PLT 231  --     COAGS: Recent Labs    11/04/19 0415  INR 1.0  APTT 24    BMP: Recent Labs    11/04/19 0415 11/04/19 0419  NA 135 137  K 4.4 4.3  CL 103 103  CO2 24  --   GLUCOSE 112* 107*  BUN 8 9  CALCIUM 9.3  --   CREATININE 0.85 0.80  GFRNONAA >60  --   GFRAA >60  --     LIVER FUNCTION TESTS: Recent Labs    11/04/19 0415   BILITOT 0.8  AST 24  ALT 41  ALKPHOS 72  PROT 7.1  ALBUMIN 3.9    Assessment and Plan:  History of CVA s/p cerebral arteriogram with emergent mechanical thrombectomy of right PCA P2 occlusion achieving a TICI 2A revascularization 11/04/2019 by Dr. Tommie Sams. Patient's condition stable- awake and alert, can spontaneously move all extremities with weakness of left side, left pronator drift. Right radial incision stable with TR band in place, radial pulses 2+ bilaterally. Further plans per neurology- appreciate and agree with management. NIR to follow.   Electronically Signed: Elwin Mocha, PA-C 11/04/2019, 11:23 AM   I spent a total of 25 Minutes at the the patient's bedside AND on the patient's hospital floor or unit, greater than 50% of which was counseling/coordinating care for CVA s/p revascularization.

## 2019-11-04 NOTE — Plan of Care (Signed)

## 2019-11-04 NOTE — ED Notes (Addendum)
Patient arrived to treatment room, girlfriend at bedside. Dr Otelia Limes at bedside to discuss possible treatment options with patient and family.

## 2019-11-04 NOTE — ED Notes (Signed)
Anesthesia team in IR to take over care of patient at this time.

## 2019-11-04 NOTE — Anesthesia Postprocedure Evaluation (Signed)
Anesthesia Post Note  Patient: Nicholas Estes  Procedure(s) Performed: IR WITH ANESTHESIA (N/A )     Patient location during evaluation: ICU Anesthesia Type: General Level of consciousness: awake Pain management: pain level controlled Vital Signs Assessment: post-procedure vital signs reviewed and stable Respiratory status: spontaneous breathing, nonlabored ventilation, respiratory function stable and patient connected to nasal cannula oxygen Cardiovascular status: blood pressure returned to baseline and stable Postop Assessment: no apparent nausea or vomiting Anesthetic complications: no    Last Vitals:  Vitals:   11/04/19 0500 11/04/19 0900  BP: (!) 153/110 (!) 119/52  Pulse:  86  Resp:  19  Temp:    SpO2:      Last Pain:  Vitals:   11/04/19 0800  TempSrc:   PainSc: 6                  Lenora Gomes P Helaina Stefano

## 2019-11-04 NOTE — Progress Notes (Signed)
Pupil change noted at 1300. Dr. Roda Shutters paged and made aware. Stat MRI and CT done.

## 2019-11-04 NOTE — Progress Notes (Signed)
Pt taken off simple mask and placed on 3L [place]. VS within normal limits. Pt in no distress, no increased WOB.

## 2019-11-05 ENCOUNTER — Inpatient Hospital Stay (HOSPITAL_COMMUNITY)

## 2019-11-05 DIAGNOSIS — I639 Cerebral infarction, unspecified: Secondary | ICD-10-CM

## 2019-11-05 DIAGNOSIS — I6389 Other cerebral infarction: Secondary | ICD-10-CM

## 2019-11-05 DIAGNOSIS — F1010 Alcohol abuse, uncomplicated: Secondary | ICD-10-CM

## 2019-11-05 LAB — CBC
HCT: 38.3 % — ABNORMAL LOW (ref 39.0–52.0)
Hemoglobin: 12.9 g/dL — ABNORMAL LOW (ref 13.0–17.0)
MCH: 32.2 pg (ref 26.0–34.0)
MCHC: 33.7 g/dL (ref 30.0–36.0)
MCV: 95.5 fL (ref 80.0–100.0)
Platelets: 216 10*3/uL (ref 150–400)
RBC: 4.01 MIL/uL — ABNORMAL LOW (ref 4.22–5.81)
RDW: 12.5 % (ref 11.5–15.5)
WBC: 7.6 10*3/uL (ref 4.0–10.5)
nRBC: 0 % (ref 0.0–0.2)

## 2019-11-05 LAB — POCT I-STAT 7, (LYTES, BLD GAS, ICA,H+H)
Acid-base deficit: 3 mmol/L — ABNORMAL HIGH (ref 0.0–2.0)
Bicarbonate: 23.7 mmol/L (ref 20.0–28.0)
Calcium, Ion: 1.19 mmol/L (ref 1.15–1.40)
HCT: 42 % (ref 39.0–52.0)
Hemoglobin: 14.3 g/dL (ref 13.0–17.0)
O2 Saturation: 100 %
Patient temperature: 37
Potassium: 4.6 mmol/L (ref 3.5–5.1)
Sodium: 137 mmol/L (ref 135–145)
TCO2: 25 mmol/L (ref 22–32)
pCO2 arterial: 49.6 mmHg — ABNORMAL HIGH (ref 32.0–48.0)
pH, Arterial: 7.288 — ABNORMAL LOW (ref 7.350–7.450)
pO2, Arterial: 343 mmHg — ABNORMAL HIGH (ref 83.0–108.0)

## 2019-11-05 LAB — GLUCOSE, CAPILLARY
Glucose-Capillary: 98 mg/dL (ref 70–99)
Glucose-Capillary: 94 mg/dL (ref 70–99)
Glucose-Capillary: 92 mg/dL (ref 70–99)
Glucose-Capillary: 101 mg/dL — ABNORMAL HIGH (ref 70–99)

## 2019-11-05 LAB — LIPID PANEL
Cholesterol: 128 mg/dL (ref 0–200)
HDL: 37 mg/dL — ABNORMAL LOW (ref >40)
LDL Cholesterol: 75 mg/dL (ref 0–99)
Total CHOL/HDL Ratio: 3.5 RATIO
Triglycerides: 82 mg/dL (ref <150)
VLDL: 16 mg/dL (ref 0–40)

## 2019-11-05 LAB — BASIC METABOLIC PANEL
Anion gap: 9 (ref 5–15)
BUN: 10 mg/dL (ref 6–20)
CO2: 23 mmol/L (ref 22–32)
Calcium: 8.5 mg/dL — ABNORMAL LOW (ref 8.9–10.3)
Chloride: 102 mmol/L (ref 98–111)
Creatinine, Ser: 0.76 mg/dL (ref 0.61–1.24)
GFR calc Af Amer: >60 mL/min (ref >60)
GFR calc non Af Amer: >60 mL/min (ref >60)
Glucose, Bld: 99 mg/dL (ref 70–99)
Potassium: 4 mmol/L (ref 3.5–5.1)
Sodium: 134 mmol/L — ABNORMAL LOW (ref 135–145)

## 2019-11-05 LAB — VITAMIN B12: Vitamin B-12: 367 pg/mL (ref 180–914)

## 2019-11-05 LAB — RPR: RPR Ser Ql: NONREACTIVE

## 2019-11-05 LAB — HEMOGLOBIN A1C
Hgb A1c MFr Bld: 6 % — ABNORMAL HIGH (ref 4.8–5.6)
Mean Plasma Glucose: 125.5 mg/dL

## 2019-11-05 LAB — HIV ANTIBODY (ROUTINE TESTING W REFLEX): HIV Screen 4th Generation wRfx: NONREACTIVE

## 2019-11-05 LAB — ECHOCARDIOGRAM COMPLETE

## 2019-11-05 LAB — TSH: TSH: 0.308 u[IU]/mL — ABNORMAL LOW (ref 0.350–4.500)

## 2019-11-05 MED ORDER — ALBUMIN HUMAN 5 % IV SOLN
INTRAVENOUS | Status: AC
  Filled 2019-11-05: qty 250

## 2019-11-05 MED ORDER — ALBUMIN HUMAN 5 % IV SOLN
12.5000 g | Freq: Four times a day (QID) | INTRAVENOUS | Status: AC
  Administered 2019-11-05 – 2019-11-06 (×4): 12.5 g via INTRAVENOUS
  Filled 2019-11-05 (×3): qty 250

## 2019-11-05 MED ORDER — SODIUM CHLORIDE 0.9 % IV BOLUS
1000.0000 mL | Freq: Once | INTRAVENOUS | Status: AC
  Administered 2019-11-05: 11:00:00 1000 mL via INTRAVENOUS

## 2019-11-05 NOTE — Procedures (Signed)
Echo attempted. Patient being taken to MRI.

## 2019-11-05 NOTE — Evaluation (Signed)
Speech Language Pathology Evaluation Patient Details Name: Nicholas Estes MRN: [id number] DOB: [birth year] Today's Date: 11/05/2019 Time: 0947-0962 SLP Time Calculation (min) (ACUTE ONLY): 20 min  Problem List:  Patient Active Problem List   Diagnosis Date Noted  . Stroke (cerebrum) (HCC) 11/04/2019  . Stroke [facility]) 11/04/2019   Past Medical History:  Past Medical History:  Diagnosis Date  . Hypertension    Past Surgical History: History reviewed. No pertinent surgical history. HPI:  Pt is a 59 y.o. male presenting from home with acute onset of dysarthria, left hemiplegia and left facial droop. MRI brain 11/04/19: Acute/subacute nonhemorrhagic infarct involving the right thalamus, right posterior temporal and occipital lobe, and right para mesencephalic cistern. Pt s/p cerebral mechanical thrombectomy of right PCA P2 occlusion and revascularization.   Assessment / Plan / Recommendation Clinical Impression  Pt participated in limited speech, language, cognition evaluation with his wife present. Pt's wife denied the pt having any baseline deficits in speech, language, or cognition. Today's evaluation was limited due to pt's lethargy and the impact of his sub-optimal alertness on his performance is strongly considered. His language skills appeared grossly WFL. He presented with moderate-severe dysarthria characterized by reduced articulatory precision and reduced vocal intensity which together reduced speech intelligibility at the phrase level. Some cognitive-linguistic deficits were noted in the areas of temporal orientation, memory, and attention. Skilled SLP services are clinically indicated at this time to target motor speech impairments and for diagnostic therapy.     SLP Assessment  SLP Recommendation/Assessment: Patient needs continued Speech Lanaguage Pathology Services SLP Visit Diagnosis: Cognitive communication deficit (R41.841);Dysarthria and anarthria (R47.1)    Follow Up  Recommendations  Other (comment)(Continued SLP services at level of care recommended by PT/OT)    Frequency and Duration min 2x/week  2 weeks      SLP Evaluation Cognition  Overall Cognitive Status: Impaired/Different from baseline Arousal/Alertness: Lethargic Orientation Level: Oriented to person;Disoriented to time;Disoriented to situation;Oriented to place Attention: Focused;Sustained Focused Attention: Impaired Focused Attention Impairment: Verbal complex Sustained Attention: Impaired Sustained Attention Impairment: Verbal complex Awareness: Impaired Awareness Impairment: Intellectual impairment       Comprehension  Auditory Comprehension Overall Auditory Comprehension: Appears within functional limits for tasks assessed Yes/No Questions: Within Functional Limits Commands: Within Functional Limits(Simple) Conversation: Simple    Expression Expression Primary Mode of Expression: Verbal Verbal Expression Overall Verbal Expression: Appears within functional limits for tasks assessed Initiation: No impairment Level of Generative/Spontaneous Verbalization: Conversation Naming: No impairment Pragmatics: Impairment Impairments: Eye contact Interfering Components: Attention;Speech intelligibility   Oral / Motor  Oral Motor/Sensory Function Overall Oral Motor/Sensory Function: Mild impairment Facial ROM: Within Functional Limits Facial Symmetry: Within Functional Limits Facial Strength: Within Functional Limits Facial Sensation: Within Functional Limits Lingual ROM: Reduced left;Suspected CN XII (hypoglossal) dysfunction Motor Speech Overall Motor Speech: Impaired Respiration: Impaired Level of Impairment: Sentence Phonation: Low vocal intensity Resonance: Within functional limits Articulation: Impaired Level of Impairment: Conversation Intelligibility: Intelligibility reduced Word: 75-100% accurate Phrase: 50-74% accurate Sentence: 50-74% accurate Conversation:  25-49% accurate Motor Planning: Witnin functional limits Motor Speech Errors: Aware;Consistent Effective Techniques: Slow rate;Over-articulate;Increased vocal intensity   [name] [name], MS, CCC-SLP Acute Rehabilitation Services Office number [phone] Pager [phone]                    [name] 11/05/2019, 2:22 PM

## 2019-11-05 NOTE — Progress Notes (Signed)
  Echocardiogram 2D Echocardiogram has been performed.  Nicholas Estes 11/05/2019, 4:28 PM

## 2019-11-05 NOTE — Progress Notes (Addendum)
STROKE TEAM PROGRESS NOTE   INTERVAL HISTORY His girlfriend is at the bedside.  Pt lying in bed and only ate 2 bites of breakfast. Pt more lethargic and drowsy, received 4mg  ativan overnight for restless. However, pt had worsening right sided weakness, this am right UE flaccid. RLE much weaker than LLE. More right gaze preference with consistent left hemianopia. His BP over night was on the low side, this am 97/66, started IV bolus and albumin. HR controlled still in afib.   OBJECTIVE Vitals:   11/05/19 0200 11/05/19 0300 11/05/19 0400 11/05/19 0500  BP: 105/67 112/80 104/65 100/65  Pulse: 87 84 80 85  Resp: 14 (!) 21 13 15   Temp:   98.9 F (37.2 C)   TempSrc:   Oral   SpO2: 96% 95% 94% 93%    CBC:  Recent Labs  Lab 11/04/19 0415 11/04/19 0415 11/04/19 0419 11/04/19 1310  WBC 5.6  --   --   --   NEUTROABS 3.5  --   --   --   HGB 16.5   < > 15.0 13.9  HCT 47.9   < > 44.0 41.0  MCV 95.2  --   --   --   PLT 231  --   --   --    < > = values in this interval not displayed.    Basic Metabolic Panel:  Recent Labs  Lab 11/04/19 0415 11/04/19 0415 11/04/19 0419 11/04/19 1310  NA 135   < > 137 137  K 4.4   < > 4.3 4.1  CL 103  --  103  --   CO2 24  --   --   --   GLUCOSE 112*  --  107*  --   BUN 8  --  9  --   CREATININE 0.85  --  0.80  --   CALCIUM 9.3  --   --   --    < > = values in this interval not displayed.    Lipid Panel: No results found for: CHOL, TRIG, HDL, CHOLHDL, VLDL, LDLCALC HgbA1c: No results found for: HGBA1C Urine Drug Screen:     Component Value Date/Time   LABOPIA NONE DETECTED 11/04/2019 1927   COCAINSCRNUR NONE DETECTED 11/04/2019 1927   LABBENZ NONE DETECTED 11/04/2019 1927   AMPHETMU NONE DETECTED 11/04/2019 1927   THCU NONE DETECTED 11/04/2019 1927   LABBARB NONE DETECTED 11/04/2019 1927    Alcohol Level No results found for: ETH  IMAGING  CT Code Stroke CTA Head W/WO contrast CT Code Stroke CTA Neck W/WO contrast CT Code Stroke  Cerebral Perfusion with contrast 11/04/2019 IMPRESSION:  1. 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 No other intracranial arterial occlusion or high-grade stenosis.  2. 31 mL ischemic penumbra in the right occipital lobe without core infarct.   CT HEAD CODE STROKE WO CONTRAST 11/04/2019 IMPRESSION:  1. Normal brain  2. ASPECTS is 10   MRI Brain WO Contrast 11/04/2019 IMPRESSION: 1. Acute/subacute nonhemorrhagic infarct involving the right thalamus, right posterior temporal and occipital lobe, and right para mesencephalic cistern. 2. Decreased reperfusion of the right PCA branch. 3. Mild diffuse sinus disease.  CT Head WO Contrast 11/04/19 IMPRESSION: 1. Small amount of subarachnoid hemorrhage within the right para mesencephalic cistern and sitting just below the tentorium on the right. 2. Evolving posteroinferior PCA territory infarct. 3. The medullary infarct is below the resolution of this study.   Transthoracic Echocardiogram - pending  Bilateral Lower Extremity Venous Dopplers 11/04/19 No DVT bilaterally   ECG - atrial fibrillation - ventricular response 103 BPM (See cardiology reading for complete details)   PHYSICAL EXAM  Temp:  [97.9 F (36.6 C)-99 F (37.2 C)] 98.9 F (37.2 C) (04/18 0400) Pulse Rate:  [65-98] 85 (04/18 0500) Resp:  [13-21] 15 (04/18 0500) BP: (97-143)/(52-120) 100/65 (04/18 0500) SpO2:  [91 %-97 %] 93 % (04/18 0500)  General - Well nourished, well developed, lethargic and drowsy.  Ophthalmologic - fundi not visualized due to noncooperation.  Cardiovascular - irregularly irregular heart rate and rhythm.  Neuro - lethargic and drowsy, able to answer limited questions, orientated to his age and people but not to time. Severe dysarthria but able to speak in short sentences. Not cooperative on naming or repetition. Left hemianopia, not blinking to visual threat on the left. Eyes right gaze preference, left gaze incomplete, limited  upward or downward gaze. Left facial droop. Tongue protrusion not cooperative. However, LUE 3/5 and RUE flaccid today. LLE 3/5 on pain and RLE 2/5 with pain stimulation. Sensation, coordination and gait not tested.   ASSESSMENT/PLAN [name] is a 59 y.o. male with history of hypertension presenting with acute onset of dysarthria, left hemiplegia and left facial droop. He did not receive IV t-PA due to late presentation (>4.5 hours from time of onset) IR - Right P2A occlusion. Multiple passes performed (2x solitaire, 3x embotrap, !x aspiration) with minimal recanalization to the level of the P2P segment (TICI2A).   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 ASPECTS is 10   CTA H&N - Occlusion of the right PCA at the proximal P2 segment.   CT Perfusion - positive for penumbra  IR unsuccessful right P2 thrombectomy  MRI head 11/04/19 - bilateral PCA scattered infarcts and medullary infarcts, small right paramesencephalic cistern SAH  CT 6/96/78 - confirmed small right perimesencephalic cistern SAH  2D Echo - pending  LE venous Doppler negative for DVT  [employer] Virus 2 - negative  LDL 75  HgbA1c 6.0  UDS - negative  VTE prophylaxis - SCDs  No antithrombotic prior to admission, now on No antithrombotic due to [facility]  Ongoing aggressive stroke risk factor management  Therapy recommendations:  pending  Disposition:  Pending  Worsening left sided weakness  Concerning for infarct extension  BP low end over night, this am 97/66  IV bolus 1L  Albumin Q6h x 4  Repeat MRI brain pending  Atrial Fibrillation - new diagnosis  EKG captured afib  Persistent afib on tele  Rate controlled at this time  Will consider metoprolol if RVR  Unable to anticoagulate at this time due to [facility]  Alcohol abuse  Heavy drinker at home as  per son  CIWA protocol  Received 4mg  ativan due to restless at night  Vitamin B1, multivitamin, folic acid  Seizure precaution  Hypertension  Home BP meds: Amlodipine 10  Current BP meds: none  Stable . BP goal 130-160 due to [facility] . Long-term BP goal normotensive  Tobacco abuse  Current smoker  Smoking cessation counseling provided  Nicotine patch provided  Pt is willing to quit  Other Stroke Risk Factors  Obesity, There is no height or weight on file to calculate BMI., recommend weight loss, diet and exercise as appropriate   Family hx stroke - not on file  Other Active Problems  Code  status - Full code  Hospital day # 1  This patient is critically ill due to multifocal posterior circulation infarcts, postprocedure SAH, new onset A. Fib, neuro worsening, heavy alcohol user, smoker and at significant risk of neurological worsening, death form recurrent posterior circulation strokes, hemorrhagic conversion, enlargement of SAH, delirium tremor, alcohol withdrawal seizure, heart failure. This patient's care requires constant monitoring of vital signs, hemodynamics, respiratory and cardiac monitoring, review of multiple databases, neurological assessment, discussion with family, other specialists and medical decision making of high complexity. I spent 30 minutes of neurocritical care time in the care of this patient. I had long discussion with girlfriend at bedside, updated pt current condition, treatment plan and potential prognosis, and answered all the questions. She expressed understanding and appreciation.   Marvel Plan, MD PhD Stroke Neurology 11/05/2019 10:47 AM   To contact Stroke Continuity provider, please refer to [email or web address]. After hours, contact General Neurology

## 2019-11-05 NOTE — Progress Notes (Signed)
PT Cancellation Note  Patient Details Name: Nicholas Estes MRN: [id number] DOB: 1961/02/24   Cancelled Treatment:    Reason Eval/Treat Not Completed: Medical issues which prohibited therapy  Spoke with RN. Noted another MRI ordered for increased weakness on his RIGHT side (opposite than on admission). Will plan to try to see 11/06/19 if medically appropriate.   Jerolyn Center, PT Pager [phone]  Zena Amos 11/05/2019, 1:38 PM

## 2019-11-06 ENCOUNTER — Encounter: Payer: Self-pay | Admitting: *Deleted

## 2019-11-06 DIAGNOSIS — I63211 Cerebral infarction due to unspecified occlusion or stenosis of right vertebral arteries: Secondary | ICD-10-CM

## 2019-11-06 DIAGNOSIS — I959 Hypotension, unspecified: Secondary | ICD-10-CM

## 2019-11-06 DIAGNOSIS — E7800 Pure hypercholesterolemia, unspecified: Secondary | ICD-10-CM

## 2019-11-06 LAB — CBC
HCT: 36.2 % — ABNORMAL LOW (ref 39.0–52.0)
Hemoglobin: 12.4 g/dL — ABNORMAL LOW (ref 13.0–17.0)
MCH: 33 pg (ref 26.0–34.0)
MCHC: 34.3 g/dL (ref 30.0–36.0)
MCV: 96.3 fL (ref 80.0–100.0)
Platelets: 179 10*3/uL (ref 150–400)
RBC: 3.76 MIL/uL — ABNORMAL LOW (ref 4.22–5.81)
RDW: 12.2 % (ref 11.5–15.5)
WBC: 6.2 10*3/uL (ref 4.0–10.5)
nRBC: 0 % (ref 0.0–0.2)

## 2019-11-06 LAB — GLUCOSE, CAPILLARY
Glucose-Capillary: 104 mg/dL — ABNORMAL HIGH (ref 70–99)
Glucose-Capillary: 107 mg/dL — ABNORMAL HIGH (ref 70–99)
Glucose-Capillary: 78 mg/dL (ref 70–99)
Glucose-Capillary: 101 mg/dL — ABNORMAL HIGH (ref 70–99)
Glucose-Capillary: 106 mg/dL — ABNORMAL HIGH (ref 70–99)

## 2019-11-06 LAB — BASIC METABOLIC PANEL
Anion gap: 10 (ref 5–15)
BUN: 8 mg/dL (ref 6–20)
CO2: 21 mmol/L — ABNORMAL LOW (ref 22–32)
Calcium: 8.3 mg/dL — ABNORMAL LOW (ref 8.9–10.3)
Chloride: 100 mmol/L (ref 98–111)
Creatinine, Ser: 0.67 mg/dL (ref 0.61–1.24)
GFR calc Af Amer: >60 mL/min (ref >60)
GFR calc non Af Amer: >60 mL/min (ref >60)
Glucose, Bld: 93 mg/dL (ref 70–99)
Potassium: 3.8 mmol/L (ref 3.5–5.1)
Sodium: 131 mmol/L — ABNORMAL LOW (ref 135–145)

## 2019-11-06 LAB — PHOSPHORUS
Phosphorus: 2.5 mg/dL (ref 2.5–4.6)
Phosphorus: 2.5 mg/dL (ref 2.5–4.6)

## 2019-11-06 LAB — MAGNESIUM
Magnesium: 1.8 mg/dL (ref 1.7–2.4)
Magnesium: 1.9 mg/dL (ref 1.7–2.4)

## 2019-11-06 MED ORDER — ASPIRIN 81 MG PO CHEW: 81.0000 mg | CHEWABLE_TABLET | Freq: Every day | ORAL | Status: DC

## 2019-11-06 MED ORDER — ALBUTEROL SULFATE (2.5 MG/3ML) 0.083% IN NEBU
INHALATION_SOLUTION | RESPIRATORY_TRACT | Status: AC
  Filled 2019-11-06: qty 3

## 2019-11-06 MED ORDER — LORAZEPAM 2 MG/ML IJ SOLN
2.0000 mg | Freq: Once | INTRAMUSCULAR | Status: AC
  Administered 2019-11-06: 01:00:00 2 mg via INTRAVENOUS

## 2019-11-06 MED ORDER — MIDODRINE HCL 5 MG PO TABS
5.0000 mg | ORAL_TABLET | Freq: Three times a day (TID) | ORAL | Status: DC
  Administered 2019-11-06 – 2019-11-09 (×9): 5 mg
  Filled 2019-11-06 (×9): qty 1

## 2019-11-06 MED ORDER — VITAL 1.5 CAL PO LIQD
1000.0000 mL | ORAL | Status: DC
  Administered 2019-11-06 – 2019-11-08 (×2): 1000 mL
  Filled 2019-11-06 (×5): qty 1000

## 2019-11-06 MED ORDER — VITAL HIGH PROTEIN PO LIQD: 1000.0000 mL | ORAL | Status: DC

## 2019-11-06 MED ORDER — ALBUTEROL SULFATE (2.5 MG/3ML) 0.083% IN NEBU
2.5000 mg | INHALATION_SOLUTION | RESPIRATORY_TRACT | Status: DC | PRN
  Administered 2019-11-06 – 2019-11-07 (×4): 2.5 mg via RESPIRATORY_TRACT
  Filled 2019-11-06 (×3): qty 3

## 2019-11-06 MED ORDER — PRO-STAT SUGAR FREE PO LIQD: 30.0000 mL | Freq: Two times a day (BID) | ORAL | Status: DC

## 2019-11-06 MED ORDER — PRO-STAT SUGAR FREE PO LIQD
30.0000 mL | Freq: Three times a day (TID) | ORAL | Status: DC
  Administered 2019-11-06 – 2019-11-08 (×8): 30 mL
  Filled 2019-11-06 (×8): qty 30

## 2019-11-06 MED ORDER — SODIUM CHLORIDE 3 % IN NEBU
4.0000 mL | INHALATION_SOLUTION | RESPIRATORY_TRACT | Status: DC
  Administered 2019-11-06: 4 mL via RESPIRATORY_TRACT
  Filled 2019-11-06: qty 4

## 2019-11-06 MED ORDER — SODIUM CHLORIDE 3 % IN NEBU: 4.0000 mL | INHALATION_SOLUTION | RESPIRATORY_TRACT | Status: DC

## 2019-11-06 MED ORDER — HEPARIN SODIUM (PORCINE) 5000 UNIT/ML IJ SOLN
5000.0000 [IU] | Freq: Three times a day (TID) | INTRAMUSCULAR | Status: DC
  Administered 2019-11-06 – 2019-11-10 (×12): 5000 [IU] via SUBCUTANEOUS
  Filled 2019-11-06 (×12): qty 1

## 2019-11-06 NOTE — Procedures (Signed)
Cortrak  Person Inserting Tube:  Osa Craver, RD Tube Type:  Cortrak - 43 inches Tube Location:  Left nare Initial Placement:  Stomach Secured by: Clip Technique Used to Measure Tube Placement:  Documented cm marking at nare/ corner of mouth Cortrak Secured At:  67 cm    Cortrak Tube Team Note:  Consult received to place a Cortrak feeding tube.   No x-ray is required. RN may begin using tube. Unable to place bridle despite multiple attempts. Tube clipped in placed   If the tube becomes dislodged please keep the tube and contact the Cortrak team at [email or web address] (password [email or web address]) for replacement.  If after hours and replacement cannot be delayed, place a NG tube and confirm placement with an abdominal x-ray.    Romelle Starcher MS, RDN, LDN, CNSC RD Pager Number and Weekend/On-Call After Hours Pager Located in [place]

## 2019-11-06 NOTE — Progress Notes (Signed)
STROKE TEAM PROGRESS NOTE   INTERVAL HISTORY Girlfriend and son at bedside.  Patient still drowsy sleepy, received 9 mg Ativan overnight and again 4 mg today for alcohol withdrawal symptoms.  BP stable at 120s, received IV fluid and albumin.  Will add midodrine today.  MRI repeat yesterday showed extension of right PCA infarct as well as right medullary and upper cervical spinal cord infarct.   OBJECTIVE Vitals:   11/06/19 0500 11/06/19 0600 11/06/19 0700 11/06/19 0800  BP: (!) 136/99 124/84 124/89 (!) 143/103  Pulse: (!) 106 85 85 (!) 109  Resp: (!) 21 16 18  (!) 21  Temp:    98.9 F (37.2 C)  TempSrc:      SpO2: 98% 100% 99% 98%    CBC:  Recent Labs  Lab 11/04/19 0415 11/04/19 0419 11/05/19 0552 11/06/19 0619  WBC 5.6   < > 7.6 6.2  NEUTROABS 3.5  --   --   --   HGB 16.5   < > 12.9* 12.4*  HCT 47.9   < > 38.3* 36.2*  MCV 95.2   < > 95.5 96.3  PLT 231   < > 216 179   < > = values in this interval not displayed.    Basic Metabolic Panel:  Recent Labs  Lab 11/05/19 0552 11/06/19 0619  NA 134* 131*  K 4.0 3.8  CL 102 100  CO2 23 21*  GLUCOSE 99 93  BUN 10 8  CREATININE 0.76 0.67  CALCIUM 8.5* 8.3*    Lipid Panel:     Component Value Date/Time   CHOL 128 11/05/2019 0552   TRIG 82 11/05/2019 0552   HDL 37 (L) 11/05/2019 0552   CHOLHDL 3.5 11/05/2019 0552   VLDL 16 11/05/2019 0552   LDLCALC 75 11/05/2019 0552   HgbA1c:  Lab Results  Component Value Date   HGBA1C 6.0 (H) 11/05/2019   Urine Drug Screen:     Component Value Date/Time   LABOPIA NONE DETECTED 11/04/2019 1927   COCAINSCRNUR NONE DETECTED 11/04/2019 1927   LABBENZ NONE DETECTED 11/04/2019 1927   AMPHETMU NONE DETECTED 11/04/2019 1927   THCU NONE DETECTED 11/04/2019 1927   LABBARB NONE DETECTED 11/04/2019 1927    Alcohol Level No results found for: ETH  IMAGING CT HEAD CODE STROKE WO CONTRAST 11/04/2019 IMPRESSION:  1. Normal brain  2. ASPECTS is 10   CT Code Stroke CTA Head W/WO  contrast CT Code Stroke CTA Neck W/WO contrast CT Code Stroke Cerebral Perfusion with contrast 11/04/2019 IMPRESSION:  1. 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 No other intracranial arterial occlusion or high-grade stenosis.  2. 31 mL ischemic penumbra in the right occipital lobe without core infarct.   MRI Brain WO Contrast 11/04/2019 IMPRESSION: 1. Acute/subacute nonhemorrhagic infarct involving the right thalamus, right posterior temporal and occipital lobe, and right para mesencephalic cistern. 2. Decreased reperfusion of the right PCA branch. 3. Mild diffuse sinus disease.  CT Head WO Contrast 11/04/19 IMPRESSION: 1. Small amount of subarachnoid hemorrhage within the right para mesencephalic cistern and sitting just below the tentorium on the right. 2. Evolving posteroinferior PCA territory infarct. 3. The medullary infarct is below the resolution of this study.  MR BRAIN WO CONTRAST 11/05/2019 1. New acute/subacute nonhemorrhagic infarcts involving the posterior right midbrain and high posterior left occipital lobe. 2. The posteromedial right PCA infarct has become more confluent. No hemorrhagic conversion. 3. Stable right posterior medullary infarct. 4. Small focus of subarachnoid hemorrhage in  the right para mesencephalic cistern is improving. No new hemorrhage is present.   ECHOCARDIOGRAM COMPLETE 11/05/2019 1. Left ventricular ejection fraction, by estimation, is 55 to 60%. The left ventricle has normal function. The left ventricle has no regional wall motion abnormalities. There is moderate asymmetric left ventricular hypertrophy of the basal-septal segment. Left ventricular diastolic parameters are indeterminate.  2. Right ventricle is poorly visualized but appears grossly normal size and mildly reduced systolic function. Tricuspid regurgitation signal is inadequate for assessing PA pressure.  3. Left atrial size was mildly dilated.  4. Right atrial size was mildly  dilated.  5. The mitral valve is normal in structure. No evidence of mitral valve regurgitation.  6. The aortic valve was not well visualized. Aortic valve regurgitation is not visualized. Mild aortic valve sclerosis is present, with no evidence of aortic valve stenosis.  7. Aortic dilatation noted. 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  8. The inferior vena cava is dilated in size with <50% respiratory variability, suggesting right atrial pressure of 15 mmHg.    Bilateral Lower Extremity Venous Dopplers 11/04/19 No DVT bilaterally  ECG - atrial fibrillation - ventricular response 103 BPM (See cardiology reading for complete details)   PHYSICAL EXAM   Temp:  [97.7 F (36.5 C)-98.9 F (37.2 C)] 98.9 F (37.2 C) (04/19 0800) Pulse Rate:  [78-109] 109 (04/19 0800) Resp:  [13-21] 21 (04/19 0800) BP: (87-148)/(67-103) 143/103 (04/19 0800) SpO2:  [87 %-100 %] 98 % (04/19 0800)  General - Well nourished, well developed, lethargic and drowsy.  Ophthalmologic - fundi not visualized due to noncooperation.  Cardiovascular - irregularly irregular heart rate and rhythm.  Neuro - lethargic and drowsy, able to answer limited questions, orientated to his age and people but not to time. Severe dysarthria but able to speak in short sentences. Not cooperative on naming or repetition. Left hemianopia, not blinking to visual threat on the left. Eyes right gaze preference, left gaze incomplete, limited upward or downward gaze. Left facial droop. Tongue protrusion not cooperative. However, LUE 3+/5 and RUE flaccid. LLE 3/5 on pain and RLE 2/5 with pain stimulation. Sensation, coordination not cooperative and gait not tested.   ASSESSMENT/PLAN [name] is a 59 y.o. male with history of hypertension presenting with acute onset of dysarthria, left hemiplegia and left facial droop. He did not receive IV t-PA due to late presentation (>4.5 hours from time of onset) IR - Right  P2A occlusion. Multiple passes performed (2x solitaire, 3x embotrap, !x aspiration) with minimal recanalization to the level of the P2P segment (TICI2A).   Stroke: Posterior multifocal infarcts s/p unsuccessful thrombectomy with postprocedure small SAH- embolic likely due to new diagnosed atrial fibrillation with new B posterior infarcts in hospital  CT Head - Normal brain. ASPECTS is 10   CTA H&N - Occlusion of the right PCA at the proximal P2 segment.   CT Perfusion - positive for penumbra  IR unsuccessful right P2 thrombectomy  MRI head 11/04/19 - bilateral PCA scattered infarcts and medullary infarcts, small right paramesencephalic cistern SAH  CT 11/04/19 - confirmed small right perimesencephalic cistern SAH  MRI 4/19 new posterior R midbrain and high posterior L occipital lobe infarcts. R PCA infarct w/o hemorrhagic transformation. Small  R paramesencephalic cistern SAH improving.    2D Echo - EF 55-60%. No source of embolus. LA mildly dilated  LE venous Doppler negative for DVT  [employer] Virus 2 - negative  LDL 75  HgbA1c 6.0  UDS - negative  VTE prophylaxis - SCDs  No antithrombotic prior to admission, now on No antithrombotic due to [facility]  Therapy recommendations:  CIR - rehab coordinator following  Disposition:  Pending  Worsening left sided weakness  BP low 4/17 overnight -> improved after IVF and albumin  IV bolus 1L  Albumin Q6h x 4  Repeat MRI brain shows new R midbrain infarct and high posterior L occipital lobe infarcts, right PCA infarct extension and right medullary and high cervical cord infarct extension  BP goal 120-160  Put on midodrine 5mg  tid   Atrial Fibrillation - new diagnosis  EKG captured afib  Persistent afib on tele  Rate controlled at this time  Will consider metoprolol if RVR  Unable to anticoagulate at this time due to [facility]  Alcohol abuse  Heavy drinker at home as per son  CIWA protocol  Received heavy ativan due to  withdraw symptoms  Vitamin B1, multivitamin, folic acid  Seizure precaution  Hypotension  Hx of hypertension  Home BP meds: Amlodipine 10  Current BP meds: none  Was low end and now improved to 120s . BP goal 120-160 . On midodrine  Tobacco abuse  Current smoker  Smoking cessation counseling provided  Nicotine patch provided  Pt is willing to quit  Dysphagia . Secondary to stroke . NPO . Cortrak placed w/ TF started . Speech on board   Other Stroke Risk Factors  Obesity, There is no height or weight on file to calculate BMI., recommend weight loss, diet and exercise as appropriate   Family hx stroke - not on file  Other Active Problems  Code status - Full code  Hospital day # 2  This patient is critically ill due to multifocal posterior circulation infarcts, postprocedure SAH, new onset A. Fib, neuro worsening, heavy alcohol user, smoker and at significant risk of neurological worsening, death form recurrent posterior circulation strokes, hemorrhagic conversion, enlargement of SAH, delirium tremor, alcohol withdrawal seizure, heart failure. This patient's care requires constant monitoring of vital signs, hemodynamics, respiratory and cardiac monitoring, review of multiple databases, neurological assessment, discussion with family, other specialists and medical decision making of high complexity. I spent 40 minutes of neurocritical care time in the care of this patient. I had long discussion with girlfriend and son at bedside, updated pt current condition, reviewed neuro images, treatment plan and potential prognosis, and answered all the questions. They expressed understanding and appreciation.   [facility], MD PhD Stroke Neurology 11/06/2019 8:52 AM   To contact Stroke Continuity provider, please refer to 11/08/2019. After hours, contact General Neurology

## 2019-11-06 NOTE — Progress Notes (Signed)
Initial Nutrition Assessment  DOCUMENTATION CODES:   Obesity unspecified  INTERVENTION:   Initiate Vital 1.5 @ 25 ml/hr and increase by 10 ml every 8 hours to goal rate of 55 ml/hr (1320 ml/day) via cortrak tube 30 ml Prostat TID  Provides: 2280 kcal, 134 grams protein, and 1003 ml free water.   Monitor magnesium and phosphorus every 12 hours x 4 occurances, MD to replete as needed, as pt is at risk for refeeding syndrome given heavy ETOH use.  NUTRITION DIAGNOSIS:   Inadequate oral intake related to lethargy/confusion as evidenced by NPO status.  GOAL:   Patient will meet greater than or equal to 90% of their needs  MONITOR:   Diet advancement, TF tolerance, Labs  REASON FOR ASSESSMENT:   Consult Enteral/tube feeding initiation and management  ASSESSMENT:   Pt with PMH of HTN and ETOH abuse now admitted with posterior multifocal infarcts likely embolic due to newly dx afib (no tPA due to late presentation) s/p unsuccessful thrombectomy with post procedure small SAH.    Pt discussed during ICU rounds and with RN.  Per RN pt too lethargic to eat, plan for cortrak placement today.  Spoke with son and ex-girlfriend at bedside. Per son pt only eats one meal per day (supper) for as long as he can remember. Pt starts drinking bud light around 9/10 am and drinks at least 18 beers per day. Per son if he cooks pt will eat a full dinner which he does most of the time. Sometimes pt will eat ham and chips and no dinner but not often.    Medications reviewed and include: folic acid, MVI, thiamine Hypertonic saline 3% Labs reviewed: Na 131 (L), PO4/Mg WNL  NUTRITION - FOCUSED PHYSICAL EXAM:    Most Recent Value  Orbital Region  No depletion  Upper Arm Region  No depletion  Thoracic and Lumbar Region  No depletion  Buccal Region  No depletion  Temple Region  No depletion  Clavicle Bone Region  No depletion  Clavicle and Acromion Bone Region  No depletion  Scapular Bone Region   No depletion  Dorsal Hand  No depletion  Patellar Region  No depletion  Anterior Thigh Region  No depletion  Posterior Calf Region  No depletion  Edema (RD Assessment)  None  Hair  Reviewed  Eyes  Unable to assess  Mouth  Unable to assess  Skin  Reviewed  Nails  Reviewed       Diet Order:   Diet Order            Diet NPO time specified  Diet effective now              EDUCATION NEEDS:   No education needs have been identified at this time  Skin:  Skin Assessment: Reviewed RN Assessment  Last BM:  4/19  Height: 5'10"  Ht Readings from Last 1 Encounters:  No data found for Ht    Weight: 105 kg   Wt Readings from Last 1 Encounters:  No data found for Wt    Ideal Body Weight:     BMI:  There is no height or weight on file to calculate BMI. BMI: 33.2 Estimated Nutritional Needs:   Kcal:  2200-2400  Protein:  120-135 grams  Fluid:  2 L/day  Cammy Copa., RD, LDN, CNSC See AMiON for contact information

## 2019-11-06 NOTE — Evaluation (Signed)
Physical Therapy Evaluation Patient Details Name: Nicholas Estes MRN: [id number] DOB: 04-06-1961 Today's Date: 11/06/2019   History of Present Illness   59 y.o. male presenting from home with acute onset of dysarthria, left hemiplegia and left facial droop. Delayed coming to ED therefore no tPA. NIHSS 14 Interventional radiology for right PCA occlusion with minimal recanalization completed. 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   Clinical Impression  Pt admitted with above. Pt was indep and working PTA. Pt's ex-gf present to assist in report of PLOF and home set up. Pt unfortunately has had bilat infarcts and presenting with both L and R sided weakness. Pt very lethargic today due to receiving ativan for agitation. Pt with no voluntary movement or command follow during eval requiring totalAx2 for bed mobility, transfer to EOB, and to maintain EOB balance. Pt to benefit from aggressive rehab program to achieve maximal functional recovery as pt was Indep PTA and working. Suspect pt can do more however is very lethargic due to having received ativan. Acute PT to cont to follow.    Follow Up Recommendations CIR    Equipment Recommendations  (TBD)    Recommendations for Other Services Rehab consult     Precautions / Restrictions Precautions Precautions: Fall Precaution Comments: bilat hemiplegia Restrictions Weight Bearing Restrictions: No      Mobility  Bed Mobility Overal bed mobility: Needs Assistance Bed Mobility: Rolling;Sidelying to Sit;Sit to Supine Rolling: +2 for physical assistance;Total assist Sidelying to sit: +2 for physical assistance;Total assist   Sit to  supine: +2 for physical assistance;Total assist   General bed mobility comments: pt with no initiation of movement, total assist for trunk elevation and LE management  Transfers                 General transfer comment: not appropriate at this time  Ambulation/Gait             General Gait Details: unable at this time  Stairs            Wheelchair Mobility    Modified Rankin (Stroke Patients Only) Modified Rankin (Stroke Patients Only) Pre-Morbid Rankin Score: No symptoms Modified Rankin: Severe disability     Balance Overall balance assessment: Needs assistance Sitting-balance support: Feet supported;No upper extremity supported Sitting balance-Leahy Scale: Zero Sitting balance - Comments: pt not actively using bilat UE to support self, pt dependent on physical assist to maintain upright posture, pt initially with strong lean to the R, s/p stretching of trunk pt with no actively leaning but no self correctiona either, pt would fall in any direction, fall with gravity Postural control: (would fall to any direction gravity would take him)     Standing balance comment: unable at this time                             Pertinent Vitals/Pain Pain Assessment: Faces Faces Pain Scale: Hurts a little bit Pain Location: grimacing to noxious stimuli on th L UE and LE as well as neck ROM Pain Descriptors / Indicators: Grimacing Pain Intervention(s): Monitored during session    Home Living Family/patient expects to be discharged to:: Private residence Living Arrangements: Children(Son) Available Help  at Discharge: Family;Available PRN/intermittently Type of Home: House Home Access: Stairs to enter Entrance Stairs-Rails: None Entrance Stairs-Number of Steps: 2 Home Layout: One level Home Equipment: Crutches      Prior Function Level of Independence: Independent         Comments: working independently, Theatre stage manager, buy/selling storage  units, son cooks, pt drives and does all other ADls     Hand Dominance   Dominant Hand: Right    Extremity/Trunk Assessment   Upper Extremity Assessment Upper Extremity Assessment: Defer to OT evaluation(noted minimal active mvmt of L UE)    Lower Extremity Assessment Lower Extremity Assessment: Difficult to assess due to impaired cognition(pt very lethargic, no active movement, stiffness)    Cervical / Trunk Assessment Cervical / Trunk Assessment: Other exceptions Cervical / Trunk Exceptions: provided trunk and cervical ROM/rotation, pt holding neck in L sidebending and   Communication   Communication: Expressive difficulties  Cognition Arousal/Alertness: Lethargic(pt was given ativan for agitation) Behavior During Therapy: Flat affect Overall Cognitive Status: Difficult to assess                                 General Comments: pt was given ativan and had minimal eye opening, pt no currenlty following commands, occasional verbalization, majority of time muffled and uninteligible. Per ex-gf pt was fine at baseline      General Comments General comments (skin integrity, edema, etc.): VSS, SpO2> 90% on RA    Exercises Other Exercises Other Exercises: passive ROM to bilat LEs into hip and knee flexion   Assessment/Plan    PT Assessment Patient needs continued PT services  PT Problem List Decreased strength;Decreased range of motion;Decreased activity tolerance;Decreased mobility;Decreased balance;Decreased coordination;Decreased cognition;Decreased knowledge of use of DME;Decreased safety awareness;Decreased knowledge of precautions       PT Treatment Interventions DME instruction;Gait training;Stair training;Functional mobility training;Therapeutic activities;Therapeutic exercise;Balance training;Neuromuscular re-education;Cognitive remediation;Patient/family education    PT Goals (Current goals can be found in the Care Plan section)  Acute Rehab PT  Goals Patient Stated Goal: unable to state PT Goal Formulation: With family Time For Goal Achievement: 11/20/19 Potential to Achieve Goals: Fair    Frequency Min 4X/week   Barriers to discharge Decreased caregiver support doesn't have 24/7 assist    Co-evaluation PT/OT/SLP Co-Evaluation/Treatment: Yes Reason for Co-Treatment: Complexity of the patient's impairments (multi-system involvement) PT goals addressed during session: Mobility/safety with mobility         AM-PAC PT "6 Clicks" Mobility  Outcome Measure Help needed turning from your back to your side while in a flat bed without using bedrails?: Total Help needed moving from lying on your back to sitting on the side of a flat bed without using bedrails?: Total Help needed moving to and from a bed to a chair (including a wheelchair)?: Total Help needed standing up from a chair using your arms (e.g., wheelchair or bedside chair)?: Total Help needed to walk in hospital room?: Total Help needed climbing 3-5 steps with a railing? : Total 6 Click Score: 6    End of Session   Activity Tolerance: Patient limited by lethargy Patient left: in bed;with call bell/phone within reach;with bed alarm set;with family/visitor present Nurse Communication: Mobility status PT Visit Diagnosis: Hemiplegia and hemiparesis Hemiplegia - Right/Left: Right(and L) Hemiplegia - dominant/non-dominant: Dominant Hemiplegia - caused by: Cerebral infarction    Time: 9735-3299 PT Time Calculation (min) (ACUTE ONLY): 32 min   Charges:  PT Evaluation $PT Eval Moderate Complexity: 1 Mod          Lewis Shock, PT, DPT Acute Rehabilitation Services Pager #: [phone] Office #: [phone]   [name] 11/06/2019, 9:31 AM

## 2019-11-06 NOTE — Progress Notes (Signed)
Rehab Admissions Coordinator Note:  Patient was screened by Clois Dupes for appropriateness for an Inpatient Acute Rehab Consult per pT recommendation..  At this time, we are recommending await further tolerance to participate in therapy. I will follow.Clois Dupes RN MSN 11/06/2019, 10:40 AM  I can be reached at [phone].

## 2019-11-06 NOTE — Social Work (Signed)
CSW was unable to complete Sbirt due to pt not being oriented. Please re consult at more appropriate times.   Jimmy Picket, Theresia Majors, [place] Clinical Social Worker [phone]

## 2019-11-06 NOTE — Progress Notes (Signed)
Referring Physician(s): Code Stroke- Nicholas Estes  Supervising Physician: Baldemar Lenis  Patient Status:  [name] - In-pt  Chief Complaint: None  Subjective:  History of CVA s/p cerebral arteriogram with emergent mechanical thrombectomy of right PCA P2 occlusion achieving a TICI 2A revascularization 11/04/2019 by Dr. Tommie Sams. Patient sleeping, required a lot of sedation overnight for withdrawal sxs. Family at bedside    Allergies: Patient has no known allergies.  Medications:  Current Facility-Administered Medications:  .  0.9 %  sodium chloride infusion, , Intravenous, Continuous, Marvel Plan, MD, Last Rate: 75 mL/hr at 11/06/19 0700, Rate Verify at 11/06/19 0700 .  acetaminophen (TYLENOL) tablet 650 mg, 650 mg, Oral, Q4H PRN, 650 mg at 11/05/19 1859 **OR** acetaminophen (TYLENOL) 160 MG/5ML solution 650 mg, 650 mg, Per Tube, Q4H PRN **OR** acetaminophen (TYLENOL) suppository 650 mg, 650 mg, Rectal, Q4H PRN, Nicholas Pina, MD .  Chlorhexidine Gluconate Cloth 2 % PADS 6 each, 6 each, Topical, Daily, Marvel Plan, MD, 6 each at 11/05/19 1816 .  folic acid (FOLVITE) tablet 1 mg, 1 mg, Oral, Daily, Marvel Plan, MD, 1 mg at 11/06/19 0913 .  LORazepam (ATIVAN) tablet 1-4 mg, 1-4 mg, Oral, Q1H PRN, 2 mg at 11/04/19 2112 **OR** LORazepam (ATIVAN) injection 1-4 mg, 1-4 mg, Intravenous, Q1H PRN, Marvel Plan, MD, 2 mg at 11/06/19 0913 .  multivitamin with minerals tablet 1 tablet, 1 tablet, Oral, Daily, Marvel Plan, MD, 1 tablet at 11/06/19 0913 .  nicotine (NICODERM CQ - dosed in mg/24 hours) patch 14 mg, 14 mg, Transdermal, Daily, Marvel Plan, MD, 14 mg at 11/06/19 1054 .  pantoprazole (PROTONIX) EC tablet 40 mg, 40 mg, Oral, Daily, Marvel Plan, MD, 40 mg at 11/06/19 0913 .  senna-docusate (Senokot-S) tablet 1 tablet, 1 tablet, Oral, QHS PRN, Nicholas Pina, MD .  sodium chloride flush (NS) 0.9 % injection 3 mL, 3 mL, Intravenous, Once, Cardama, Amadeo Garnet, MD .   thiamine tablet 100 mg, 100 mg, Oral, Daily, 100 mg at 11/06/19 0913 **OR** thiamine (B-1) injection 100 mg, 100 mg, Intravenous, Daily, Marvel Plan, MD    Vital Signs: BP 121/85   Pulse 87   Temp 98.9 F (37.2 C)   Resp 19   SpO2 93%   Physical Exam Vitals and nursing note reviewed.  Constitutional:      General: He is not in acute distress.    Appearance: Normal appearance.  Pulmonary:     Effort: Pulmonary effort is normal. No respiratory distress.  Skin:    General: Skin is warm and dry.     Comments: Right radial incision soft, NT, no hematoma  Neurological:     Mental Status: He is alert.     Comments: Sleeping, did not arouse     Imaging: CT Code Stroke CTA Head W/WO contrast  Result Date: 11/04/2019 CLINICAL DATA:  Visual field cut. EXAM: CT ANGIOGRAPHY HEAD AND NECK CT PERFUSION BRAIN TECHNIQUE: Multidetector CT imaging of the head and neck was performed using the standard protocol during bolus administration of intravenous contrast. Multiplanar CT image reconstructions and MIPs were obtained to evaluate the vascular anatomy. Carotid stenosis measurements (when applicable) are obtained utilizing NASCET criteria, using the distal internal carotid diameter as the denominator. Multiphase CT imaging of the brain was performed following IV bolus contrast injection. Subsequent parametric perfusion maps were calculated using RAPID software. CONTRAST:  OMNIPAQUE IOHEXOL 350 MG/ML SOLN COMPARISON:  None. FINDINGS: CTA NECK FINDINGS SKELETON: There is no bony  spinal canal stenosis. No lytic or blastic lesion. OTHER NECK: Normal pharynx, larynx and major salivary glands. No cervical lymphadenopathy. Unremarkable thyroid gland. UPPER CHEST: No pneumothorax or pleural effusion. No nodules or masses. AORTIC ARCH: There is no calcific atherosclerosis of the aortic arch. There is no aneurysm, dissection or hemodynamically significant stenosis of the visualized portion of the aorta.  Conventional 3 vessel aortic branching pattern. The visualized proximal subclavian arteries are widely patent. RIGHT CAROTID SYSTEM: Normal without aneurysm, dissection or stenosis. LEFT CAROTID SYSTEM: Normal without aneurysm, dissection or stenosis. VERTEBRAL ARTERIES: Left dominant configuration. Both origins are clearly patent. There is no dissection, occlusion or flow-limiting stenosis to the skull base (V1-V3 segments). CTA HEAD FINDINGS POSTERIOR CIRCULATION: --Vertebral arteries: Normal V4 segments. --Posterior inferior cerebellar arteries (PICA): Patent origins from the vertebral arteries. --Anterior inferior cerebellar arteries (AICA): Patent origins from the basilar artery. --Basilar artery: Normal. --Superior cerebellar arteries: Normal. 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 Normal left. ANTERIOR CIRCULATION: --Intracranial internal carotid arteries: Normal. --Anterior cerebral arteries (ACA): Normal. Both A1 segments are present. Patent anterior communicating artery (a-comm). --Middle cerebral arteries (MCA): Normal. VENOUS SINUSES: As permitted by contrast timing, patent. ANATOMIC VARIANTS: None Review of the MIP images confirms the above findings. CT Brain Perfusion Findings: ASPECTS: On further review of the earlier noncontrast head CT images, ASPECTS is downgraded to 9 based on the presence hypoattenuation in the right thalamus. CBF (<30%) Volume: 0mL Perfusion (Tmax>6.0s) volume: 31mL Mismatch Volume: 31mL Infarction Location:No core infarct. The area of ischemic penumbra is in the right occipital lobe. IMPRESSION: 1. 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 No other intracranial arterial occlusion or high-grade stenosis. Critical Value/emergent results were called by telephone at the time of interpretation on 11/04/2019 at 4:43 am to provider ERIC [facility] , who verbally acknowledged these results. 2. 31 mL ischemic penumbra in the right  occipital lobe without core infarct. Electronically Signed   By: Deatra Robinson M.D.   On: 11/04/2019 04:49   DG Chest 2 View  Result Date: 11/04/2019 CLINICAL DATA:  CVA, left-sided weakness EXAM: CHEST - 2 VIEW COMPARISON:  11/04/2019 chest radiograph. FINDINGS: Stable cardiomediastinal silhouette with moderate cardiomegaly. No pneumothorax. No pleural effusion. Slightly low lung volumes. No overt pulmonary edema. No acute consolidative airspace disease. IMPRESSION: Stable moderate cardiomegaly without overt pulmonary edema. No active pulmonary disease. Electronically Signed   By: Delbert Phenix M.D.   On: 11/04/2019 12:39   CT HEAD WO CONTRAST  Result Date: 11/04/2019 CLINICAL DATA:  Cerebral hemorrhage suspected. Abnormal MRI. EXAM: CT HEAD WITHOUT CONTRAST TECHNIQUE: Contiguous axial images were obtained from the base of the skull through the vertex without intravenous contrast. COMPARISON:  MR head without contrast 11/04/2019 FINDINGS: Brain: A small amount of subarachnoid hemorrhage is confirmed within the right para mesencephalic cistern and sitting just below the tentorium on the right. No significant intraventricular hemorrhage is present. Evolving posteroinferior PCA territory infarct is again noted. The medullary infarct is below the resolution of this study. No other new infarcts are present. Basal ganglia are intact. The ventricles are of normal size. No other significant extraaxial fluid collection is present. Vascular: No hyperdense vessel or unexpected calcification. Skull: Calvarium is intact. No focal lytic or blastic lesions are present. No significant extracranial soft tissue lesion is present. Sinuses/Orbits: Scattered mucosal thickening is again seen throughout the paranasal sinuses. The mastoid air cells are clear. The globes and orbits are within normal limits. IMPRESSION:  1. Small amount of subarachnoid hemorrhage within the right para mesencephalic cistern and sitting just below the  tentorium on the right. 2. Evolving posteroinferior PCA territory infarct. 3. The medullary infarct is below the resolution of this study. Electronically Signed   By: Marin Roberts M.D.   On: 11/04/2019 16:34   CT Code Stroke CTA Neck W/WO contrast  Result Date: 11/04/2019 CLINICAL DATA:  Visual field cut. EXAM: CT ANGIOGRAPHY HEAD AND NECK CT PERFUSION BRAIN TECHNIQUE: Multidetector CT imaging of the head and neck was performed using the standard protocol during bolus administration of intravenous contrast. Multiplanar CT image reconstructions and MIPs were obtained to evaluate the vascular anatomy. Carotid stenosis measurements (when applicable) are obtained utilizing NASCET criteria, using the distal internal carotid diameter as the denominator. Multiphase CT imaging of the brain was performed following IV bolus contrast injection. Subsequent parametric perfusion maps were calculated using RAPID software. CONTRAST:  OMNIPAQUE IOHEXOL 350 MG/ML SOLN COMPARISON:  None. FINDINGS: CTA NECK FINDINGS SKELETON: There is no bony spinal canal stenosis. No lytic or blastic lesion. OTHER NECK: Normal pharynx, larynx and major salivary glands. No cervical lymphadenopathy. Unremarkable thyroid gland. UPPER CHEST: No pneumothorax or pleural effusion. No nodules or masses. AORTIC ARCH: There is no calcific atherosclerosis of the aortic arch. There is no aneurysm, dissection or hemodynamically significant stenosis of the visualized portion of the aorta. Conventional 3 vessel aortic branching pattern. The visualized proximal subclavian arteries are widely patent. RIGHT CAROTID SYSTEM: Normal without aneurysm, dissection or stenosis. LEFT CAROTID SYSTEM: Normal without aneurysm, dissection or stenosis. VERTEBRAL ARTERIES: Left dominant configuration. Both origins are clearly patent. There is no dissection, occlusion or flow-limiting stenosis to the skull base (V1-V3 segments). CTA HEAD FINDINGS POSTERIOR  CIRCULATION: --Vertebral arteries: Normal V4 segments. --Posterior inferior cerebellar arteries (PICA): Patent origins from the vertebral arteries. --Anterior inferior cerebellar arteries (AICA): Patent origins from the basilar artery. --Basilar artery: Normal. --Superior cerebellar arteries: Normal. 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 Normal left. ANTERIOR CIRCULATION: --Intracranial internal carotid arteries: Normal. --Anterior cerebral arteries (ACA): Normal. Both A1 segments are present. Patent anterior communicating artery (a-comm). --Middle cerebral arteries (MCA): Normal. VENOUS SINUSES: As permitted by contrast timing, patent. ANATOMIC VARIANTS: None Review of the MIP images confirms the above findings. CT Brain Perfusion Findings: ASPECTS: On further review of the earlier noncontrast head CT images, ASPECTS is downgraded to 9 based on the presence hypoattenuation in the right thalamus. CBF (<30%) Volume: 62mL Perfusion (Tmax>6.0s) volume: 80mL Mismatch Volume: 92mL Infarction Location:No core infarct. The area of ischemic penumbra is in the right occipital lobe. IMPRESSION: 1. 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 No other intracranial arterial occlusion or high-grade stenosis. Critical Value/emergent results were called by telephone at the time of interpretation on 11/04/2019 at 4:43 am to provider ERIC [facility] , who verbally acknowledged these results. 2. 31 mL ischemic penumbra in the right occipital lobe without core infarct. Electronically Signed   By: Deatra Robinson M.D.   On: 11/04/2019 04:49   MR BRAIN WO CONTRAST  Result Date: 11/05/2019 CLINICAL DATA:  Progressive left-sided weakness. New right-sided weakness. EXAM: MRI HEAD WITHOUT CONTRAST TECHNIQUE: Multiplanar, multiecho pulse sequences of the brain and surrounding structures were obtained without intravenous contrast. COMPARISON:  CT head without contrast 11/04/2019. MR head  without contrast 11/04/2019 FINDINGS: Brain: Diffusion changes have progressed significantly. Right PCA territory infarct is now more confluent involving the medial and inferior right occipital lobe  as well as significant portions of the right thalamus. The right posterior medullary infarct is again seen. A new focus of restricted diffusion is present in the posterior right midbrain. Additional new foci acute nonhemorrhagic infarct are present in the high posterior left occipital lobe. T2 signal changes are again noted with these areas of acute infarction. No new hemorrhage or mass lesion is evident. Previously seen hemorrhage in the right perimesencephalic cistern has decreased. Vascular: Flow is present in the major intracranial arteries. Skull and upper cervical spine: The craniocervical junction is normal. Upper cervical spine is within normal limits. Marrow signal is unremarkable. Sinuses/Orbits: Mild mucosal thickening is stable. Conjugate rightward gaze is noted. Globes and orbits are within normal limits otherwise. IMPRESSION: 1. New cute/subacute nonhemorrhagic infarcts involving the posterior right midbrain and high posterior left occipital lobe. 2. The posteromedial right PCA infarct has become more confluent. No hemorrhagic conversion. 3. Stable right posterior medullary infarct. 4. Small focus of subarachnoid hemorrhage in the right para mesencephalic cistern is improving. No new hemorrhage is present. Electronically Signed   By: Marin Robertshristopher  Mattern M.D.   On: 11/05/2019 15:34   MR BRAIN WO CONTRAST  Addendum Date: 11/04/2019   ADDENDUM REPORT: 11/04/2019 16:03 ADDENDUM: A punctate nonhemorrhagic infarct is present in the posterior right medulla. This may explain the patient's extremity numbness. Recommend CT of the head to assess possible hemorrhage. Critical Value/emergent results were called by telephone at the time of interpretation on 11/04/2019 at 3: 55 pm [facility] , who verbally  acknowledged these results. Electronically Signed   By: Marin Robertshristopher  Mattern M.D.   On: 11/04/2019 16:03   Result Date: 11/04/2019 CLINICAL DATA:  Stroke. Visual field cut. Status post attempted reperfusion the right P2 segment. TICI 2A EXAM: MRI HEAD WITHOUT CONTRAST TECHNIQUE: Multiplanar, multiecho pulse sequences of the brain and surrounding structures were obtained without intravenous contrast. COMPARISON:  CT HEAD WITHOUT CONTRAST CTA HEAD AND NECK, AND CT PERFUSION 11/04/2019 FINDINGS: Brain: Diffusion-weighted images demonstrate restricted diffusion within the right thalamus and along the medial and inferior posterior right temporal and occipital lobe. Focal signal is present within the right para mesencephalic cistern suggesting a small subarachnoid hemorrhage. The areas of infarction correspond to the area of ischemia in the right PCA territory on the CT perfusion study and are consistent with incomplete reperfusion of the right PCA branch. Vascular: Flow is present in the major intracranial arteries. Skull and upper cervical spine: Skull base is unremarkable. Sinuses/Orbits: Mild diffuse mucosal thickening present throughout paranasal sinuses. No significant fluid levels are present. The mastoid air cells are clear. The globes and orbits are within normal limits. IMPRESSION: 1. Acute/subacute nonhemorrhagic infarct involving the right thalamus, right posterior temporal and occipital lobe, and right para mesencephalic cistern. 2. Decreased reperfusion of the right PCA branch. 3. Mild diffuse sinus disease. Electronically Signed: By: Marin Robertshristopher  Mattern M.D. On: 11/04/2019 15:33   CT Code Stroke Cerebral Perfusion with contrast  Result Date: 11/04/2019 CLINICAL DATA:  Visual field cut. EXAM: CT ANGIOGRAPHY HEAD AND NECK CT PERFUSION BRAIN TECHNIQUE: Multidetector CT imaging of the head and neck was performed using the standard protocol during bolus administration of intravenous contrast. Multiplanar  CT image reconstructions and MIPs were obtained to evaluate the vascular anatomy. Carotid stenosis measurements (when applicable) are obtained utilizing NASCET criteria, using the distal internal carotid diameter as the denominator. Multiphase CT imaging of the brain was performed following IV bolus contrast injection. Subsequent parametric perfusion maps were calculated using RAPID software.  CONTRAST:  OMNIPAQUE IOHEXOL 350 MG/ML SOLN COMPARISON:  None. FINDINGS: CTA NECK FINDINGS SKELETON: There is no bony spinal canal stenosis. No lytic or blastic lesion. OTHER NECK: Normal pharynx, larynx and major salivary glands. No cervical lymphadenopathy. Unremarkable thyroid gland. UPPER CHEST: No pneumothorax or pleural effusion. No nodules or masses. AORTIC ARCH: There is no calcific atherosclerosis of the aortic arch. There is no aneurysm, dissection or hemodynamically significant stenosis of the visualized portion of the aorta. Conventional 3 vessel aortic branching pattern. The visualized proximal subclavian arteries are widely patent. RIGHT CAROTID SYSTEM: Normal without aneurysm, dissection or stenosis. LEFT CAROTID SYSTEM: Normal without aneurysm, dissection or stenosis. VERTEBRAL ARTERIES: Left dominant configuration. Both origins are clearly patent. There is no dissection, occlusion or flow-limiting stenosis to the skull base (V1-V3 segments). CTA HEAD FINDINGS POSTERIOR CIRCULATION: --Vertebral arteries: Normal V4 segments. --Posterior inferior cerebellar arteries (PICA): Patent origins from the vertebral arteries. --Anterior inferior cerebellar arteries (AICA): Patent origins from the basilar artery. --Basilar artery: Normal. --Superior cerebellar arteries: Normal. 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 Normal left. ANTERIOR CIRCULATION: --Intracranial internal carotid arteries: Normal. --Anterior cerebral arteries (ACA): Normal. Both A1 segments are present.  Patent anterior communicating artery (a-comm). --Middle cerebral arteries (MCA): Normal. VENOUS SINUSES: As permitted by contrast timing, patent. ANATOMIC VARIANTS: None Review of the MIP images confirms the above findings. CT Brain Perfusion Findings: ASPECTS: On further review of the earlier noncontrast head CT images, ASPECTS is downgraded to 9 based on the presence hypoattenuation in the right thalamus. CBF (<30%) Volume: 69mL Perfusion (Tmax>6.0s) volume: 104mL Mismatch Volume: 52mL Infarction Location:No core infarct. The area of ischemic penumbra is in the right occipital lobe. IMPRESSION: 1. 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 No other intracranial arterial occlusion or high-grade stenosis. Critical Value/emergent results were called by telephone at the time of interpretation on 11/04/2019 at 4:43 am to provider ERIC [facility] , who verbally acknowledged these results. 2. 31 mL ischemic penumbra in the right occipital lobe without core infarct. Electronically Signed   By: Deatra Robinson M.D.   On: 11/04/2019 04:49   ECHOCARDIOGRAM COMPLETE  Result Date: 11/05/2019    ECHOCARDIOGRAM REPORT   Patient Name:   Nicholas Estes Date of Exam: 11/05/2019 Medical Rec #:  [id number]     Height: Accession #:    [id number]    Weight: Date of Birth:  1960-09-26    BSA: Patient Age:    58 years      BP:           135/87 mmHg Patient Gender: M             HR:           64 bpm. Exam Location:  Inpatient Procedure: 2D Echo, Cardiac Doppler and Color Doppler Indications:    Stroke  History:        Patient has no prior history of Echocardiogram examinations.  Sonographer:    Ross Ludwig RDCS (AE) Referring Phys: [id number] ERIC LINDZEN  Sonographer Comments: Patient is morbidly obese. Image acquisition challenging due to respiratory motion and Image acquisition challenging due to patient body habitus. IMPRESSIONS  1. Left ventricular ejection fraction, by estimation, is 55 to 60%. The left ventricle has normal function.  The left ventricle has no regional wall motion abnormalities. There is moderate asymmetric left ventricular hypertrophy of the basal-septal segment. Left ventricular diastolic parameters are indeterminate.  2. Right ventricle is poorly visualized but  appears grossly normal size and mildly reduced systolic function. Tricuspid regurgitation signal is inadequate for assessing PA pressure.  3. Left atrial size was mildly dilated.  4. Right atrial size was mildly dilated.  5. The mitral valve is normal in structure. No evidence of mitral valve regurgitation.  6. The aortic valve was not well visualized. Aortic valve regurgitation is not visualized. Mild aortic valve sclerosis is present, with no evidence of aortic valve stenosis.  7. Aortic dilatation noted. 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  8. The inferior vena cava is dilated in size with <50% respiratory variability, suggesting right atrial pressure of 15 mmHg. FINDINGS  Left Ventricle: Left ventricular ejection fraction, by estimation, is 55 to 60%. The left ventricle has normal function. The left ventricle has no regional wall motion abnormalities. The left ventricular internal cavity size was normal in size. There is  moderate asymmetric left ventricular hypertrophy of the basal-septal segment. Left ventricular diastolic parameters are indeterminate. Right Ventricle: The right ventricular size is normal. Right vetricular wall thickness was not assessed. Right ventricular systolic function is mildly reduced. Tricuspid regurgitation signal is inadequate for assessing PA pressure. Left Atrium: Left atrial size was mildly dilated. Right Atrium: Right atrial size was mildly dilated. Pericardium: There is no evidence of pericardial effusion. Mitral Valve: The mitral valve is normal in structure. No evidence of mitral valve regurgitation. MV peak gradient, 3.9 mmHg. The mean mitral valve gradient is 2.0 mmHg. Tricuspid Valve: The tricuspid  valve is grossly normal. Tricuspid valve regurgitation is not demonstrated. Aortic Valve: The aortic valve was not well visualized. Aortic valve regurgitation is not visualized. Mild aortic valve sclerosis is present, with no evidence of aortic valve stenosis. Aortic valve mean gradient measures 3.4 mmHg. Aortic valve peak gradient measures 6.2 mmHg. Aortic valve area, by VTI measures 2.44 cm. Pulmonic Valve: The pulmonic valve was not well visualized. Pulmonic valve regurgitation is not visualized. Aorta: Aortic dilatation noted. 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 Venous: The inferior vena cava is dilated in size with less than 50% respiratory variability, suggesting right atrial pressure of 15 mmHg. IAS/Shunts: The interatrial septum was not well visualized.  LEFT VENTRICLE PLAX 2D LVIDd:         4.20 cm LVIDs:         2.90 cm LV PW:         1.20 cm LV IVS:        1.30 cm LVOT diam:     2.10 cm LV SV:         56 LVOT Area:     3.46 cm  RIGHT VENTRICLE            IVC RV Basal diam:  3.40 cm    IVC diam: 2.70 cm RV S prime:     8.27 cm/s TAPSE (M-mode): 2.2 cm LEFT ATRIUM           RIGHT ATRIUM LA diam:      4.10 cm RA Area:     22.90 cm LA Vol (A2C): 79.2 ml RA Volume:   61.80 ml LA Vol (A4C): 83.5 ml  AORTIC VALVE AV Area (Vmax):    2.36 cm AV Area (Vmean):   2.27 cm AV Area (VTI):     2.44 cm AV Vmax:           124.20 cm/s AV Vmean:          85.900 cm/s AV VTI:  0.230 m AV Peak Grad:      6.2 mmHg AV Mean Grad:      3.4 mmHg LVOT Vmax:         84.68 cm/s LVOT Vmean:        56.260 cm/s LVOT VTI:          0.162 m LVOT/AV VTI ratio: 0.70  AORTA Ao Root diam: 3.90 cm Ao Asc diam:  3.70 cm MITRAL VALVE MV Area (PHT): 2.06 cm  SHUNTS MV Peak grad:  3.9 mmHg  Systemic VTI:  0.16 m MV Mean grad:  2.0 mmHg  Systemic Diam: 2.10 cm MV Vmax:       0.98 m/s MV Vmean:      58.3 cm/s Epifanio Lesches MD Electronically signed by Epifanio Lesches MD Signature Date/Time:  11/05/2019/10:04:00 PM    Final    CT HEAD CODE STROKE WO CONTRAST  Result Date: 11/04/2019 CLINICAL DATA:  Code stroke.  Slurred speech and left-sided numbness EXAM: CT HEAD WITHOUT CONTRAST TECHNIQUE: Contiguous axial images were obtained from the base of the skull through the vertex without intravenous contrast. COMPARISON:  None. FINDINGS: Brain: There is no mass, hemorrhage or extra-axial collection. The size and configuration of the ventricles and extra-axial CSF spaces are normal. The brain parenchyma is normal, without evidence of acute or chronic infarction. Vascular: No abnormal hyperdensity of the major intracranial arteries or dural venous sinuses. No intracranial atherosclerosis. Skull: The visualized skull base, calvarium and extracranial soft tissues are normal. Sinuses/Orbits: No fluid levels or advanced mucosal thickening of the visualized paranasal sinuses. No mastoid or middle ear effusion. The orbits are normal. ASPECTS [facility] Stroke Program Early CT Score) - Ganglionic level infarction (caudate, lentiform nuclei, internal capsule, insula, M1-M3 cortex): 7 - Supraganglionic infarction (M4-M6 cortex): 3 Total score (0-10 with 10 being normal): 10 IMPRESSION: 1. Normal brain 2. ASPECTS is 10 3. These results were communicated to Dr. Caryl Estes at 4:16 am on 11/04/2019 by text page via the [facility] messaging system. Electronically Signed   By: Deatra Robinson M.D.   On: 11/04/2019 04:17   VAS [place] LOWER EXTREMITY VENOUS (DVT)  Result Date: 11/04/2019  Lower Venous DVTStudy Indications: Stroke.  Limitations: Movement. Comparison Study: No prior study on file Performing Technologist: Sherren Kerns RVS  Examination Guidelines: A complete evaluation includes B-mode imaging, spectral Doppler, color Doppler, and power Doppler as needed of all accessible portions of each vessel. Bilateral testing is considered an integral part of a complete examination. Limited examinations for reoccurring indications  may be performed as noted. The reflux portion of the exam is performed with the patient in reverse Trendelenburg.  +---------+---------------+---------+-----------+----------+--------------+ RIGHT    CompressibilityPhasicitySpontaneityPropertiesThrombus Aging +---------+---------------+---------+-----------+----------+--------------+ CFV      Full           Yes      Yes                                 +---------+---------------+---------+-----------+----------+--------------+ SFJ      Full                                                        +---------+---------------+---------+-----------+----------+--------------+ FV Prox  Full                                                        +---------+---------------+---------+-----------+----------+--------------+  FV Mid   Full                                                        +---------+---------------+---------+-----------+----------+--------------+ FV DistalFull                                                        +---------+---------------+---------+-----------+----------+--------------+ PFV      Full                                                        +---------+---------------+---------+-----------+----------+--------------+ POP      Full           Yes      Yes                                 +---------+---------------+---------+-----------+----------+--------------+ PTV      Full                                                        +---------+---------------+---------+-----------+----------+--------------+ PERO     Full                                                        +---------+---------------+---------+-----------+----------+--------------+   +---------+---------------+---------+-----------+----------+--------------+ LEFT     CompressibilityPhasicitySpontaneityPropertiesThrombus Aging +---------+---------------+---------+-----------+----------+--------------+ CFV       Full           Yes      Yes                                 +---------+---------------+---------+-----------+----------+--------------+ SFJ      Full                                                        +---------+---------------+---------+-----------+----------+--------------+ FV Prox  Full                                                        +---------+---------------+---------+-----------+----------+--------------+ FV Mid   Full                                                        +---------+---------------+---------+-----------+----------+--------------+  FV DistalFull                                                        +---------+---------------+---------+-----------+----------+--------------+ PFV      Full                                                        +---------+---------------+---------+-----------+----------+--------------+ POP      Full           Yes      Yes                                 +---------+---------------+---------+-----------+----------+--------------+ PTV      Full                                                        +---------+---------------+---------+-----------+----------+--------------+ PERO     Full                                                        +---------+---------------+---------+-----------+----------+--------------+     Summary: BILATERAL: - No evidence of deep vein thrombosis seen in the lower extremities, bilaterally.  RIGHT: - A cystic structure is found in the popliteal fossa.  LEFT: - No cystic structure found in the popliteal fossa.  *See table(s) above for measurements and observations. Electronically signed by Sherald Hess MD on 11/04/2019 at 3:12:03 PM.    Final     Labs:  CBC: Recent Labs    11/04/19 0415 11/04/19 0419 11/04/19 0739 11/04/19 1310 11/05/19 0552 11/06/19 0619  WBC 5.6  --   --   --  7.6 6.2  HGB 16.5   < > 14.3 13.9 12.9* 12.4*  HCT 47.9   < > 42.0  41.0 38.3* 36.2*  PLT 231  --   --   --  216 179   < > = values in this interval not displayed.    COAGS: Recent Labs    11/04/19 0415  INR 1.0  APTT 24    BMP: Recent Labs    11/04/19 0415 11/04/19 0415 11/04/19 0419 11/04/19 0419 11/04/19 0739 11/04/19 1310 11/05/19 0552 11/06/19 0619  NA 135   < > 137   < > 137 137 134* 131*  K 4.4   < > 4.3   < > 4.6 4.1 4.0 3.8  CL 103  --  103  --   --   --  102 100  CO2 24  --   --   --   --   --  23 21*  GLUCOSE 112*  --  107*  --   --   --  99 93  BUN 8  --  9  --   --   --  10 8  CALCIUM 9.3  --   --   --   --   --  8.5* 8.3*  CREATININE 0.85  --  0.80  --   --   --  0.76 0.67  GFRNONAA >60  --   --   --   --   --  >60 >60  GFRAA >60  --   --   --   --   --  >60 >60   < > = values in this interval not displayed.    LIVER FUNCTION TESTS: Recent Labs    11/04/19 0415  BILITOT 0.8  AST 24  ALT 41  ALKPHOS 72  PROT 7.1  ALBUMIN 3.9    Assessment and Plan:  History of CVA s/p cerebral arteriogram with emergent mechanical thrombectomy of right PCA P2 occlusion achieving a TICI 2A revascularization 11/04/2019 by Dr. Karenann Cai. Sedated this am due to withdrawal sxs. Further plans per neurology- appreciate and agree with management. NIR to follow.   Electronically Signed: Ascencion Dike, PA-C 11/06/2019, 10:54 AM   I spent a total of 25 Minutes at the the patient's bedside AND on the patient's hospital floor or unit, greater than 50% of which was counseling/coordinating care for CVA s/p revascularization.

## 2019-11-06 NOTE — Evaluation (Signed)
Occupational Therapy Evaluation Patient Details Name: Nicholas Estes MRN: [id number] DOB: Jul 05, 1961 Today's Date: 11/06/2019    History of Present Illness  59 y.o. male presenting from home with acute onset of dysarthria, left hemiplegia and left facial droop. Delayed coming to ED therefore no tPA. NIHSS 14 Interventional radiology for right PCA occlusion with minimal recanalization completed. + new onset afib MRI-bilateral PCA scattered infarcts and medullary infarcts, small right paramesencephalic cistern SAH PMHx-alcohol and tobacco abuse; HTN, obesity,  Pt then had a change in neuro status while in hospital and was found to have New cute/subacute nonhemorrhagic infarcts involving the posterior.   Clinical Impression   PTA, pt was living with his son and was independent and working part time; information provided by ex-girlfriend who was present during session. Pt currently requiring Total A for ADLs and bed mobility. Pt given ativan early this AM due to agitations. Pt presenting with decreased following of commands, arousal, functional use of UEs (no active movement of RUE), sitting balance, and activity tolerance. Facilitating ROM at neck, trunk, UEs, and LEs. Pt will require further acute OT to facilitate safe dc. Recommend dc to CIR for intensive OT to optimize safety, independence with ADLs, and return to PLOF.     Follow Up Recommendations  CIR;Supervision/Assistance - 24 hour    Equipment Recommendations  Other (comment)(Defer to next venue)    Recommendations for Other Services PT consult     Precautions / Restrictions Precautions Precautions: Fall Precaution Comments: bilat hemiplegia Restrictions Weight Bearing Restrictions: No      Mobility Bed Mobility Overal bed mobility: Needs Assistance Bed Mobility: Rolling;Sidelying to Sit;Sit to Supine Rolling: +2 for physical assistance;Total assist Sidelying to sit: +2 for physical assistance;Total assist   Sit to supine:  +2 for physical assistance;Total assist   General bed mobility comments: pt with no initiation of movement, total assist for trunk elevation and LE management  Transfers                 General transfer comment: not appropriate at this time    Balance Overall balance assessment: Needs assistance Sitting-balance support: Feet supported;No upper extremity supported Sitting balance-Leahy Scale: Zero Sitting balance - Comments: pt not actively using bilat UE to support self, pt dependent on physical assist to maintain upright posture, pt initially with strong lean to the R, s/p stretching of trunk pt with no actively leaning but no self correctiona either, pt would fall in any direction, fall with gravity Postural control: (would fall to any direction gravity would take him)     Standing balance comment: unable at this time                           ADL either performed or assessed with clinical judgement   ADL Overall ADL's : Needs assistance/impaired                                       General ADL Comments: Total A for ADLs and bed mobility due to decreased cognition, following commands, and awareness.      Vision Baseline Vision/History: Wears glasses Wears Glasses: At all times Patient Visual Report: Other (comment)(Per ex-girlfriend)       Perception     Praxis      Pertinent Vitals/Pain Pain Assessment: Faces Faces Pain Scale: Hurts a little bit Pain Location: grimacing to noxious  stimuli on th L UE and LE as well as neck ROM Pain Descriptors / Indicators: Grimacing Pain Intervention(s): Monitored during session;Limited activity within patient's tolerance;Repositioned     Hand Dominance Right   Extremity/Trunk Assessment Upper Extremity Assessment Upper Extremity Assessment: Difficult to assess due to impaired cognition;RUE deficits/detail;LUE deficits/detail RUE Deficits / Details: Withdrawl to painful stimuli. No active  movement. WFL for PROM. Noting increased edema.  RUE Coordination: decreased fine motor;decreased gross motor LUE Deficits / Details: Withdrawl to painful stimuli. Minimal active movement. No intentional movement and following of commands during ROM LUE Coordination: decreased fine motor;decreased gross motor   Lower Extremity Assessment Lower Extremity Assessment: Defer to PT evaluation   Cervical / Trunk Assessment Cervical / Trunk Assessment: Other exceptions Cervical / Trunk Exceptions: provided trunk and cervical ROM/rotation, pt holding neck in L sidebending and    Communication Communication Communication: Expressive difficulties   Cognition Arousal/Alertness: Lethargic(pt was given ativan for agitation) Behavior During Therapy: Flat affect Overall Cognitive Status: Difficult to assess                                 General Comments: pt was given ativan and had minimal eye opening, pt no currenlty following commands, occasional verbalization, majority of time muffled and uninteligible. Per ex-gf pt was fine at baseline   General Comments  VSS, SpO2 >90% on RA    Exercises Exercises: General Lower Extremity;Other exercises Other Exercises Other Exercises: passive ROM to bilat LEs into hip and knee flexion   Shoulder Instructions      Home Living Family/patient expects to be discharged to:: Private residence Living Arrangements: Children(Son) Available Help at Discharge: Family;Available PRN/intermittently Type of Home: House Home Access: Stairs to enter [employer] of Steps: 2 Entrance Stairs-Rails: None Home Layout: One level     Bathroom Shower/Tub: Teacher, early years/pre: Standard     Home Equipment: Crutches          Prior Functioning/Environment Level of Independence: Independent        Comments: working independently, Theatre stage manager, buy/selling storage units, son cooks, pt drives and does all other ADls         OT Problem List: Decreased strength;Decreased range of motion;Decreased activity tolerance;Impaired balance (sitting and/or standing);Decreased knowledge of use of DME or AE;Decreased knowledge of precautions;Decreased safety awareness;Decreased cognition;Decreased coordination;Impaired UE functional use;Pain      OT Treatment/Interventions: Self-care/ADL training;Therapeutic exercise;Energy conservation;DME and/or AE instruction;Therapeutic activities;Patient/family education    OT Goals(Current goals can be found in the care plan section) Acute Rehab OT Goals Patient Stated Goal: unable to state OT Goal Formulation: Patient unable to participate in goal setting Time For Goal Achievement: 11/20/19 Potential to Achieve Goals: Good  OT Frequency: Min 2X/week   Barriers to D/C:            Co-evaluation PT/OT/SLP Co-Evaluation/Treatment: Yes Reason for Co-Treatment: For patient/therapist safety;To address functional/ADL transfers;Complexity of the patient's impairments (multi-system involvement);Necessary to address cognition/behavior during functional activity PT goals addressed during session: Mobility/safety with mobility OT goals addressed during session: ADL's and self-care      AM-PAC OT "6 Clicks" Daily Activity     Outcome Measure Help from another person eating meals?: Total Help from another person taking care of personal grooming?: Total Help from another person toileting, which includes using toliet, bedpan, or urinal?: Total Help from another person bathing (including washing, rinsing, drying)?: Total Help from another  person to put on and taking off regular upper body clothing?: Total Help from another person to put on and taking off regular lower body clothing?: Total 6 Click Score: 6   End of Session Nurse Communication: Mobility status;Other (comment)(SpO2 on RA)  Activity Tolerance: Patient limited by lethargy;Patient limited by fatigue Patient left: in  bed;with call bell/phone within reach;with bed alarm set;with family/visitor present;with restraints reapplied  OT Visit Diagnosis: Unsteadiness on feet (R26.81);Other abnormalities of gait and mobility (R26.89);Muscle weakness (generalized) (M62.81);Pain Pain - part of body: (Generalized)                Time: 2248-2500 OT Time Calculation (min): 34 min Charges:  OT General Charges $OT Visit: 1 Visit OT Evaluation $OT Eval Moderate Complexity: 1 Mod  Charina Fons MSOT, OTR/L Acute Rehab Pager: [phone] Office: [phone]  Theodoro Grist Puanani Gene 11/06/2019, 10:37 AM

## 2019-11-07 DIAGNOSIS — I4819 Other persistent atrial fibrillation: Secondary | ICD-10-CM

## 2019-11-07 LAB — BASIC METABOLIC PANEL
Anion gap: 10 (ref 5–15)
BUN: 8 mg/dL (ref 6–20)
CO2: 24 mmol/L (ref 22–32)
Calcium: 8.6 mg/dL — ABNORMAL LOW (ref 8.9–10.3)
Chloride: 98 mmol/L (ref 98–111)
Creatinine, Ser: 0.72 mg/dL (ref 0.61–1.24)
GFR calc Af Amer: >60 mL/min (ref >60)
GFR calc non Af Amer: >60 mL/min (ref >60)
Glucose, Bld: 104 mg/dL — ABNORMAL HIGH (ref 70–99)
Potassium: 3.7 mmol/L (ref 3.5–5.1)
Sodium: 132 mmol/L — ABNORMAL LOW (ref 135–145)

## 2019-11-07 LAB — CBC
HCT: 39.1 % (ref 39.0–52.0)
Hemoglobin: 13.7 g/dL (ref 13.0–17.0)
MCH: 32.8 pg (ref 26.0–34.0)
MCHC: 35 g/dL (ref 30.0–36.0)
MCV: 93.5 fL (ref 80.0–100.0)
Platelets: 194 10*3/uL (ref 150–400)
RBC: 4.18 MIL/uL — ABNORMAL LOW (ref 4.22–5.81)
RDW: 11.9 % (ref 11.5–15.5)
WBC: 7.6 10*3/uL (ref 4.0–10.5)
nRBC: 0 % (ref 0.0–0.2)

## 2019-11-07 LAB — MAGNESIUM
Magnesium: 2 mg/dL (ref 1.7–2.4)
Magnesium: 2 mg/dL (ref 1.7–2.4)

## 2019-11-07 LAB — GLUCOSE, CAPILLARY
Glucose-Capillary: 115 mg/dL — ABNORMAL HIGH (ref 70–99)
Glucose-Capillary: 102 mg/dL — ABNORMAL HIGH (ref 70–99)
Glucose-Capillary: 113 mg/dL — ABNORMAL HIGH (ref 70–99)
Glucose-Capillary: 111 mg/dL — ABNORMAL HIGH (ref 70–99)
Glucose-Capillary: 168 mg/dL — ABNORMAL HIGH (ref 70–99)
Glucose-Capillary: 147 mg/dL — ABNORMAL HIGH (ref 70–99)

## 2019-11-07 LAB — PHOSPHORUS
Phosphorus: 3.1 mg/dL (ref 2.5–4.6)
Phosphorus: 2.8 mg/dL (ref 2.5–4.6)

## 2019-11-07 MED ORDER — SENNOSIDES-DOCUSATE SODIUM 8.6-50 MG PO TABS: 1.0000 | ORAL_TABLET | Freq: Every evening | ORAL | Status: DC | PRN

## 2019-11-07 MED ORDER — FOLIC ACID 1 MG PO TABS
1.0000 mg | ORAL_TABLET | Freq: Every day | ORAL | Status: DC
  Administered 2019-11-07 – 2019-11-08 (×2): 1 mg
  Filled 2019-11-07 (×3): qty 1

## 2019-11-07 MED ORDER — ALBUTEROL SULFATE (2.5 MG/3ML) 0.083% IN NEBU
2.5000 mg | INHALATION_SOLUTION | RESPIRATORY_TRACT | Status: DC
  Administered 2019-11-07 – 2019-11-08 (×5): 2.5 mg via RESPIRATORY_TRACT
  Filled 2019-11-07 (×5): qty 3

## 2019-11-07 MED ORDER — ASPIRIN 81 MG PO CHEW
81.0000 mg | CHEWABLE_TABLET | Freq: Every day | ORAL | Status: DC
  Administered 2019-11-07 – 2019-11-08 (×2): 81 mg
  Filled 2019-11-07 (×2): qty 1

## 2019-11-07 MED ORDER — LORAZEPAM 2 MG/ML IJ SOLN: 1.0000 mg | INTRAMUSCULAR | Status: DC | PRN

## 2019-11-07 MED ORDER — LORAZEPAM 1 MG PO TABS
1.0000 mg | ORAL_TABLET | ORAL | Status: DC | PRN
  Administered 2019-11-07 – 2019-11-08 (×3): 1 mg
  Filled 2019-11-07 (×2): qty 1

## 2019-11-07 MED ORDER — ORAL CARE MOUTH RINSE
15.0000 mL | Freq: Two times a day (BID) | OROMUCOSAL | Status: DC
  Administered 2019-11-08 – 2019-11-14 (×8): 15 mL via OROMUCOSAL

## 2019-11-07 MED ORDER — THIAMINE HCL 100 MG/ML IJ SOLN
100.0000 mg | Freq: Every day | INTRAMUSCULAR | Status: DC
  Filled 2019-11-07: qty 2

## 2019-11-07 MED ORDER — CHLORHEXIDINE GLUCONATE 0.12 % MT SOLN
15.0000 mL | Freq: Two times a day (BID) | OROMUCOSAL | Status: DC
  Administered 2019-11-07 – 2019-11-15 (×10): 15 mL via OROMUCOSAL
  Filled 2019-11-07 (×14): qty 15

## 2019-11-07 MED ORDER — THIAMINE HCL 100 MG PO TABS
100.0000 mg | ORAL_TABLET | Freq: Every day | ORAL | Status: DC
  Administered 2019-11-07 – 2019-11-08 (×2): 100 mg
  Filled 2019-11-07 (×2): qty 1

## 2019-11-07 MED ORDER — SODIUM CHLORIDE 3 % IN NEBU
4.0000 mL | INHALATION_SOLUTION | Freq: Two times a day (BID) | RESPIRATORY_TRACT | Status: DC
  Administered 2019-11-07: 4 mL via RESPIRATORY_TRACT
  Filled 2019-11-07: qty 4

## 2019-11-07 MED ORDER — ADULT MULTIVITAMIN W/MINERALS CH
1.0000 | ORAL_TABLET | Freq: Every day | ORAL | Status: DC
  Administered 2019-11-07 – 2019-11-08 (×2): 1
  Filled 2019-11-07 (×3): qty 1

## 2019-11-07 MED ORDER — LORAZEPAM 1 MG PO TABS
1.0000 mg | ORAL_TABLET | ORAL | Status: DC | PRN
  Filled 2019-11-07: qty 1

## 2019-11-07 NOTE — Progress Notes (Signed)
Pt requiring more O2 and scheduled albuterol nebs to maintain Sats. Wheezing and rhonchi in chest. Discussed with RT and MD. Will adjust meds and hold off on transfer for now.

## 2019-11-07 NOTE — Progress Notes (Addendum)
Referring Physician(s): Code Stroke- Kerney Elbe  Supervising Physician: Pedro Earls  Patient Status:  [facility] - In-pt  Chief Complaint: None- lethargic  Subjective:  History of CVA s/p cerebral arteriogram with emergent mechanical thrombectomy of right PCA P2 occlusion achieving a TICI 2A revascularization 11/04/2019 by Dr. Karenann Cai. Patient laying in bed resting. Appears lethargic- eyes remain closed throughout most of conversation. Wife at bedside. Can spontaneously move left side with minimal spontaneous movements of right side. Right radial incision c/d/i.   Allergies: Patient has no known allergies.  Medications: Prior to Admission medications   Medication Sig Start Date End Date Taking? Authorizing Provider  amLODipine (NORVASC) 10 MG tablet Take 10 mg by mouth daily.   Yes [provider]     Vital Signs: BP (!) 142/94   Pulse 99   Temp 98.8 F (37.1 C)   Resp (!) 21   Ht '5\' 11"'$  (1.803 m)   Wt 236 lb 8.9 oz (107.3 kg)   SpO2 100%   BMI 32.99 kg/m   Physical Exam Vitals and nursing note reviewed.  Constitutional:      General: He is not in acute distress. Pulmonary:     Effort: Pulmonary effort is normal. No respiratory distress.  Skin:    General: Skin is warm and dry.     Comments: Right radial incision soft without active bleeding or hematoma.  Neurological:     Comments: Lethargic but responds to voice, eyes primarily closed throughout today's conversation. Speech clear however does not always form clear sentences (probably secondary to withdrawal symptoms). PERRL bilaterally. Can spontaneously move left side with minimal spontaneous movements of right side. Distal pulses (radial) 2+ bilaterally.     Imaging: CT Code Stroke CTA Head W/WO contrast  Result Date: 11/04/2019 CLINICAL DATA:  Visual field cut. EXAM: CT ANGIOGRAPHY HEAD AND NECK CT PERFUSION BRAIN TECHNIQUE: Multidetector CT imaging of the head  and neck was performed using the standard protocol during bolus administration of intravenous contrast. Multiplanar CT image reconstructions and MIPs were obtained to evaluate the vascular anatomy. Carotid stenosis measurements (when applicable) are obtained utilizing NASCET criteria, using the distal internal carotid diameter as the denominator. Multiphase CT imaging of the brain was performed following IV bolus contrast injection. Subsequent parametric perfusion maps were calculated using RAPID software. CONTRAST:  179m OMNIPAQUE IOHEXOL 350 MG/ML SOLN COMPARISON:  None. FINDINGS: CTA NECK FINDINGS SKELETON: There is no bony spinal canal stenosis. No lytic or blastic lesion. OTHER NECK: Normal pharynx, larynx and major salivary glands. No cervical lymphadenopathy. Unremarkable thyroid gland. UPPER CHEST: No pneumothorax or pleural effusion. No nodules or masses. AORTIC ARCH: There is no calcific atherosclerosis of the aortic arch. There is no aneurysm, dissection or hemodynamically significant stenosis of the visualized portion of the aorta. Conventional 3 vessel aortic branching pattern. The visualized proximal subclavian arteries are widely patent. RIGHT CAROTID SYSTEM: Normal without aneurysm, dissection or stenosis. LEFT CAROTID SYSTEM: Normal without aneurysm, dissection or stenosis. VERTEBRAL ARTERIES: Left dominant configuration. Both origins are clearly patent. There is no dissection, occlusion or flow-limiting stenosis to the skull base (V1-V3 segments). CTA HEAD FINDINGS POSTERIOR CIRCULATION: --Vertebral arteries: Normal V4 segments. --Posterior inferior cerebellar arteries (PICA): Patent origins from the vertebral arteries. --Anterior inferior cerebellar arteries (AICA): Patent origins from the basilar artery. --Basilar artery: Normal. --Superior cerebellar arteries: Normal. 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 Normal left. ANTERIOR CIRCULATION:  --Intracranial internal carotid arteries: Normal. --  Anterior cerebral arteries (ACA): Normal. Both A1 segments are present. Patent anterior communicating artery (a-comm). --Middle cerebral arteries (MCA): Normal. VENOUS SINUSES: As permitted by contrast timing, patent. ANATOMIC VARIANTS: None Review of the MIP images confirms the above findings. CT Brain Perfusion Findings: ASPECTS: On further review of the earlier noncontrast head CT images, ASPECTS is downgraded to 9 based on the presence hypoattenuation in the right thalamus. CBF (<30%) Volume: 54m Perfusion (Tmax>6.0s) volume: 367mMismatch Volume: 3132mnfarction Location:No core infarct. The area of ischemic penumbra is in the right occipital lobe. IMPRESSION: 1. 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 No other intracranial arterial occlusion or high-grade stenosis. Critical Value/emergent results were called by telephone at the time of interpretation on 11/04/2019 at 4:43 am to provider [facility] Serviceswho verbally acknowledged these results. 2. 31 mL ischemic penumbra in the right occipital lobe without core infarct. Electronically Signed   By: KevUlyses JarredD.   On: 11/04/2019 04:49   DG Chest 2 View  Result Date: 11/04/2019 CLINICAL DATA:  CVA, left-sided weakness EXAM: CHEST - 2 VIEW COMPARISON:  11/04/2019 chest radiograph. FINDINGS: Stable cardiomediastinal silhouette with moderate cardiomegaly. No pneumothorax. No pleural effusion. Slightly low lung volumes. No overt pulmonary edema. No acute consolidative airspace disease. IMPRESSION: Stable moderate cardiomegaly without overt pulmonary edema. No active pulmonary disease. Electronically Signed   By: JasIlona SorrelD.   On: 11/04/2019 12:39   CT HEAD WO CONTRAST  Result Date: 11/04/2019 CLINICAL DATA:  Cerebral hemorrhage suspected. Abnormal MRI. EXAM: CT HEAD WITHOUT CONTRAST TECHNIQUE: Contiguous axial images were obtained from the base of the skull through the vertex without  intravenous contrast. COMPARISON:  MR head without contrast 11/04/2019 FINDINGS: Brain: A small amount of subarachnoid hemorrhage is confirmed within the right para mesencephalic cistern and sitting just below the tentorium on the right. No significant intraventricular hemorrhage is present. Evolving posteroinferior PCA territory infarct is again noted. The medullary infarct is below the resolution of this study. No other new infarcts are present. Basal ganglia are intact. The ventricles are of normal size. No other significant extraaxial fluid collection is present. Vascular: No hyperdense vessel or unexpected calcification. Skull: Calvarium is intact. No focal lytic or blastic lesions are present. No significant extracranial soft tissue lesion is present. Sinuses/Orbits: Scattered mucosal thickening is again seen throughout the paranasal sinuses. The mastoid air cells are clear. The globes and orbits are within normal limits. IMPRESSION: 1. Small amount of subarachnoid hemorrhage within the right para mesencephalic cistern and sitting just below the tentorium on the right. 2. Evolving posteroinferior PCA territory infarct. 3. The medullary infarct is below the resolution of this study. Electronically Signed   By: ChrSan MorelleD.   On: 11/04/2019 16:34   CT Code Stroke CTA Neck W/WO contrast  Result Date: 11/04/2019 CLINICAL DATA:  Visual field cut. EXAM: CT ANGIOGRAPHY HEAD AND NECK CT PERFUSION BRAIN TECHNIQUE: Multidetector CT imaging of the head and neck was performed using the standard protocol during bolus administration of intravenous contrast. Multiplanar CT image reconstructions and MIPs were obtained to evaluate the vascular anatomy. Carotid stenosis measurements (when applicable) are obtained utilizing NASCET criteria, using the distal internal carotid diameter as the denominator. Multiphase CT imaging of the brain was performed following IV bolus contrast injection. Subsequent parametric  perfusion maps were calculated using RAPID software. CONTRAST:  125m4mNIPAQUE IOHEXOL 350 MG/ML SOLN COMPARISON:  None. FINDINGS: CTA NECK FINDINGS SKELETON: There is no bony spinal canal stenosis.  No lytic or blastic lesion. OTHER NECK: Normal pharynx, larynx and major salivary glands. No cervical lymphadenopathy. Unremarkable thyroid gland. UPPER CHEST: No pneumothorax or pleural effusion. No nodules or masses. AORTIC ARCH: There is no calcific atherosclerosis of the aortic arch. There is no aneurysm, dissection or hemodynamically significant stenosis of the visualized portion of the aorta. Conventional 3 vessel aortic branching pattern. The visualized proximal subclavian arteries are widely patent. RIGHT CAROTID SYSTEM: Normal without aneurysm, dissection or stenosis. LEFT CAROTID SYSTEM: Normal without aneurysm, dissection or stenosis. VERTEBRAL ARTERIES: Left dominant configuration. Both origins are clearly patent. There is no dissection, occlusion or flow-limiting stenosis to the skull base (V1-V3 segments). CTA HEAD FINDINGS POSTERIOR CIRCULATION: --Vertebral arteries: Normal V4 segments. --Posterior inferior cerebellar arteries (PICA): Patent origins from the vertebral arteries. --Anterior inferior cerebellar arteries (AICA): Patent origins from the basilar artery. --Basilar artery: Normal. --Superior cerebellar arteries: Normal. 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 Normal left. ANTERIOR CIRCULATION: --Intracranial internal carotid arteries: Normal. --Anterior cerebral arteries (ACA): Normal. Both A1 segments are present. Patent anterior communicating artery (a-comm). --Middle cerebral arteries (MCA): Normal. VENOUS SINUSES: As permitted by contrast timing, patent. ANATOMIC VARIANTS: None Review of the MIP images confirms the above findings. CT Brain Perfusion Findings: ASPECTS: On further review of the earlier noncontrast head CT images, ASPECTS is  downgraded to 9 based on the presence hypoattenuation in the right thalamus. CBF (<30%) Volume: 79m Perfusion (Tmax>6.0s) volume: 331mMismatch Volume: 312mnfarction Location:No core infarct. The area of ischemic penumbra is in the right occipital lobe. IMPRESSION: 1. 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 No other intracranial arterial occlusion or high-grade stenosis. Critical Value/emergent results were called by telephone at the time of interpretation on 11/04/2019 at 4:43 am to provider [facility] LINFirst [facility] Centerwho verbally acknowledged these results. 2. 31 mL ischemic penumbra in the right occipital lobe without core infarct. Electronically Signed   By: KevUlyses JarredD.   On: 11/04/2019 04:49   MR BRAIN WO CONTRAST  Result Date: 11/05/2019 CLINICAL DATA:  Progressive left-sided weakness. New right-sided weakness. EXAM: MRI HEAD WITHOUT CONTRAST TECHNIQUE: Multiplanar, multiecho pulse sequences of the brain and surrounding structures were obtained without intravenous contrast. COMPARISON:  CT head without contrast 11/04/2019. MR head without contrast 11/04/2019 FINDINGS: Brain: Diffusion changes have progressed significantly. Right PCA territory infarct is now more confluent involving the medial and inferior right occipital lobe as well as significant portions of the right thalamus. The right posterior medullary infarct is again seen. A new focus of restricted diffusion is present in the posterior right midbrain. Additional new foci acute nonhemorrhagic infarct are present in the high posterior left occipital lobe. T2 signal changes are again noted with these areas of acute infarction. No new hemorrhage or mass lesion is evident. Previously seen hemorrhage in the right perimesencephalic cistern has decreased. Vascular: Flow is present in the major intracranial arteries. Skull and upper cervical spine: The craniocervical junction is normal. Upper cervical spine is within normal limits. Marrow signal  is unremarkable. Sinuses/Orbits: Mild mucosal thickening is stable. Conjugate rightward gaze is noted. Globes and orbits are within normal limits otherwise. IMPRESSION: 1. New cute/subacute nonhemorrhagic infarcts involving the posterior right midbrain and high posterior left occipital lobe. 2. The posteromedial right PCA infarct has become more confluent. No hemorrhagic conversion. 3. Stable right posterior medullary infarct. 4. Small focus of subarachnoid hemorrhage in the right para mesencephalic cistern is improving. No new hemorrhage is present.  Electronically Signed   By: San Morelle M.D.   On: 11/05/2019 15:34   MR BRAIN WO CONTRAST  Addendum Date: 11/04/2019   ADDENDUM REPORT: 11/04/2019 16:03 ADDENDUM: A punctate nonhemorrhagic infarct is present in the posterior right medulla. This may explain the patient's extremity numbness. Recommend CT of the head to assess possible hemorrhage. Critical Value/emergent results were called by telephone at the time of interpretation on 11/04/2019 at 3: 55 pm to provider [facility] , who verbally acknowledged these results. Electronically Signed   By: San Morelle M.D.   On: 11/04/2019 16:03   Result Date: 11/04/2019 CLINICAL DATA:  Stroke. Visual field cut. Status post attempted reperfusion the right P2 segment. TICI 2A EXAM: MRI HEAD WITHOUT CONTRAST TECHNIQUE: Multiplanar, multiecho pulse sequences of the brain and surrounding structures were obtained without intravenous contrast. COMPARISON:  CT HEAD WITHOUT CONTRAST CTA HEAD AND NECK, AND CT PERFUSION 11/04/2019 FINDINGS: Brain: Diffusion-weighted images demonstrate restricted diffusion within the right thalamus and along the medial and inferior posterior right temporal and occipital lobe. Focal signal is present within the right para mesencephalic cistern suggesting a small subarachnoid hemorrhage. The areas of infarction correspond to the area of ischemia in the right PCA territory on the CT  perfusion study and are consistent with incomplete reperfusion of the right PCA branch. Vascular: Flow is present in the major intracranial arteries. Skull and upper cervical spine: Skull base is unremarkable. Sinuses/Orbits: Mild diffuse mucosal thickening present throughout paranasal sinuses. No significant fluid levels are present. The mastoid air cells are clear. The globes and orbits are within normal limits. IMPRESSION: 1. Acute/subacute nonhemorrhagic infarct involving the right thalamus, right posterior temporal and occipital lobe, and right para mesencephalic cistern. 2. Decreased reperfusion of the right PCA branch. 3. Mild diffuse sinus disease. Electronically Signed: By: San Morelle M.D. On: 11/04/2019 15:33   IR CT Head Ltd  Result Date: 11/06/2019 INDICATION: 59 year old male with past medical history significant for hypertension, tobacco and alcohol abuse. He presented to the ED with sudden onset of dysarthria, left hemiplegia and facial droop started at 2 p.m. on 11/03/2018 which is spontaneously resolved but recurred at 6:30 p.m. His baseline modified Rankin scale was 0, NIHSS at presentation was 14. No intravenous tPA administered as he was outside of window. Head CT showed no evidence of large acute infarct or hemorrhage. CT angiogram of the head and neck showed a proximal right P2/PCA occlusion. CT EXAM: Ultrasound-guided vascular access Diagnostic cerebral angiogram Mechanical thrombectomy Flat panel head CT. COMPARISON:  CT/CT angiogram of the head and neck 11/04/2019. MEDICATIONS: None. ANESTHESIA/SEDATION: General anesthesia performed. CONTRAST:  96 mL of Omnipaque 240 FLUOROSCOPY TIME:  Fluoroscopy Time: 49 minutes 0 seconds (1874 mGy). COMPLICATIONS: None immediate. TECHNIQUE: Informed written consent was obtained from the patient next of kin after a thorough discussion of the procedural risks, benefits and alternatives. All questions were addressed. Maximal Sterile Barrier  Technique was utilized including caps, mask, sterile gowns, sterile gloves, sterile drape, hand hygiene and skin antiseptic. A timeout was performed prior to the initiation of the procedure. Real-time ultrasound guidance was utilized for vascular access including the acquisition of a permanent ultrasound image documenting patency of the accessed vessel. Using the modified Seldinger technique and a micropuncture kit, access was gained to the distal right radial artery at the anatomical snuffbox and a 7 French sheath was placed. Then, a right radial artery roadmap was obtained via sheath side port. Normal brachial artery branching pattern seen. No significant anatomical variation.  The right radial artery caliber is adequate for vascular access. At under fluoroscopy, a 5 French vert catheter was navigated over a 0.035 inch Terumo Glidewire into the right subclavian artery. The right subclavian roadmap was obtained and the the catheter was exchanged over the wire for a 7 [place] Rist catheter which was placed in the distal V2 segment of the right vertebral artery. Frontal and lateral angiograms of the head were then obtained. FINDINGS: There is no occlusion of the proximal P2 segment of the right posterior cerebral artery. PROCEDURE: Under biplane roadmap, a [place] catheter was navigated over a phenom 21 microcatheter and a synchro support microguidewire into the basilar artery. The microcatheter was then navigated over the wire into the right P3/PCA. Then, a 4 x 40 mm solitaire stent retriever was deployed spanning the P1-P2 segment. The device was allowed to intercalated with the clot for 4 minutes. The microcatheter was removed. The [place] was advanced to the level of the P1 segment and connected to a penumbra aspiration pump. The thrombectomy device and Sofia catheter were removed under constant aspiration. Follow-up right vertebral artery angiogram showed a persistent occlusion of the right posterior cerebral  artery. In a similar fashion, another stent retriever pass was performed with the solitaire. Follow-up she angiograms showed persistent occlusion of the right PCA. Under biplane roadmap, a [place] catheter was navigated over a phenom 21 microcatheter and a synchro support microguidewire into the basilar artery. The microcatheter was then navigated over the wire into the right P3/PCA. Then, a 5 x 37 mm embotrap stent retriever was deployed spanning the P1-P2 segment. The device was allowed to intercalated with the clot for 4 minutes. The microcatheter was removed. The [place] was advanced to the level of the P1 segment and connected to a penumbra aspiration pump. The thrombectomy device and Sofia catheter were removed under constant aspiration. 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 Flat panel CT of the head was obtained and post processed in a separate workstation with concurrent attending physician supervision. Selected images were sent to PACS. No evidence of hemorrhagic complication seen. On the biplane roadmap, a 3 max aspiration catheter was navigated over a synchro support microguidewire to the level of occlusion at the right P2 segment. The 3 max was connected to a separation on in contain is aspiration was performed for 5 minutes. The 3 max was subsequently retracted under constant aspiration. A follow-up right vertebral artery angiogram showed occlusion al at the level of the distal P2 segment (P2P). Under biplane roadmap, a [place] catheter was navigated over a phenom 21 microcatheter and a synchro support microguidewire into the basilar artery. The microcatheter was then navigated over the wire into the right P3/PCA. Then, a 5 x 37 mm embotrap stent retriever was deployed spanning the P1-P2 segment. The device was allowed to intercalated with the clot for 4 minutes. The microcatheter was removed. The [place] was advanced  to the level of the P1 segment and connected to a penumbra aspiration pump. The thrombectomy device and Sofia catheter were removed under constant aspiration. Follow-up right artery angiogram showed to occlusion at the level of the distal P2 segment. Flat panel CT of the head was obtained and post processed in a separate workstation with concurrent attending physician supervision. Selected images were sent to PACS. No evidence of hemorrhagic complication seen. The catheter was subsequently withdrawn. The radial sheath was removed and inflatable band was placed  over the access site. 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 PLAN: Transferred to ICU for continued monitoring. Electronically Signed   By: Pedro Earls M.D.   On: 11/06/2019 15:42   IR CT Head Ltd  Result Date: 11/06/2019 INDICATION: 59 year old male with past medical history significant for hypertension, tobacco and alcohol abuse. He presented to the ED with sudden onset of dysarthria, left hemiplegia and facial droop started at 2 p.m. on 11/03/2018 which is spontaneously resolved but recurred at 6:30 p.m. His baseline modified Rankin scale was 0, NIHSS at presentation was 14. No intravenous tPA administered as he was outside of window. Head CT showed no evidence of large acute infarct or hemorrhage. CT angiogram of the head and neck showed a proximal right P2/PCA occlusion. CT EXAM: Ultrasound-guided vascular access Diagnostic cerebral angiogram Mechanical thrombectomy Flat panel head CT. COMPARISON:  CT/CT angiogram of the head and neck 11/04/2019. MEDICATIONS: None. ANESTHESIA/SEDATION: General anesthesia performed. CONTRAST:  96 mL of Omnipaque 240 FLUOROSCOPY TIME:  Fluoroscopy Time: 49 minutes 0 seconds (1874 mGy). COMPLICATIONS: None immediate. TECHNIQUE: Informed written consent was obtained  from the patient next of kin after a thorough discussion of the procedural risks, benefits and alternatives. All questions were addressed. Maximal Sterile Barrier Technique was utilized including caps, mask, sterile gowns, sterile gloves, sterile drape, hand hygiene and skin antiseptic. A timeout was performed prior to the initiation of the procedure. Real-time ultrasound guidance was utilized for vascular access including the acquisition of a permanent ultrasound image documenting patency of the accessed vessel. Using the modified Seldinger technique and a micropuncture kit, access was gained to the distal right radial artery at the anatomical snuffbox and a 7 French sheath was placed. Then, a right radial artery roadmap was obtained via sheath side port. Normal brachial artery branching pattern seen. No significant anatomical variation. The right radial artery caliber is adequate for vascular access. At under fluoroscopy, a 5 French vert catheter was navigated over a 0.035 inch Terumo Glidewire into the right subclavian artery. The right subclavian roadmap was obtained and the the catheter was exchanged over the wire for a 7 [place] Rist catheter which was placed in the distal V2 segment of the right vertebral artery. Frontal and lateral angiograms of the head were then obtained. FINDINGS: There is no occlusion of the proximal P2 segment of the right posterior cerebral artery. PROCEDURE: Under biplane roadmap, a [place] catheter was navigated over a phenom 21 microcatheter and a synchro support microguidewire into the basilar artery. The microcatheter was then navigated over the wire into the right P3/PCA. Then, a 4 x 40 mm solitaire stent retriever was deployed spanning the P1-P2 segment. The device was allowed to intercalated with the clot for 4 minutes. The microcatheter was removed. The [place] was advanced to the level of the P1 segment and connected to a penumbra aspiration pump. The thrombectomy device  and Sofia catheter were removed under constant aspiration. 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 In a similar fashion, another stent retriever pass was performed with the solitaire. Follow-up she angiograms showed persistent occlusion of the right PCA. Under biplane roadmap, a [place] catheter was navigated over a phenom 21 microcatheter and a synchro support microguidewire into the basilar artery. The microcatheter was then navigated over the wire into the right P3/PCA. Then, a 5 x 37  mm embotrap stent retriever was deployed spanning the P1-P2 segment. The device was allowed to intercalated with the clot for 4 minutes. The microcatheter was removed. The [place] was advanced to the level of the P1 segment and connected to a penumbra aspiration pump. The thrombectomy device and Sofia catheter were removed under constant aspiration. 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 Flat panel CT of the head was obtained and post processed in a separate workstation with concurrent attending physician supervision. Selected images were sent to PACS. No evidence of hemorrhagic complication seen. On the biplane roadmap, a 3 max aspiration catheter was navigated over a synchro support microguidewire to the level of occlusion at the right P2 segment. The 3 max was connected to a separation on in contain is aspiration was performed for 5 minutes. The 3 max was subsequently retracted under constant aspiration. A follow-up right vertebral artery angiogram showed occlusion al at the level of the distal P2 segment (P2P). Under biplane roadmap, a [place] catheter was navigated over a phenom 21 microcatheter and a synchro support microguidewire into the basilar artery. The microcatheter was then navigated over the wire into the right P3/PCA. Then, a 5 x 37 mm embotrap stent retriever  was deployed spanning the P1-P2 segment. The device was allowed to intercalated with the clot for 4 minutes. The microcatheter was removed. The [place] was advanced to the level of the P1 segment and connected to a penumbra aspiration pump. The thrombectomy device and Sofia catheter were removed under constant aspiration. Follow-up right artery angiogram showed to occlusion at the level of the distal P2 segment. Flat panel CT of the head was obtained and post processed in a separate workstation with concurrent attending physician supervision. Selected images were sent to PACS. No evidence of hemorrhagic complication seen. The catheter was subsequently withdrawn. The radial sheath was removed and inflatable band was placed over the access site. 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 PLAN: Transferred to ICU for continued monitoring. Electronically Signed   By: Pedro Earls M.D.   On: 11/06/2019 15:42   IR US Guide Vasc Access Right  Result Date: 11/06/2019 INDICATION: 59 year old male with past medical history significant for hypertension, tobacco and alcohol abuse. He presented to the ED with sudden onset of dysarthria, left hemiplegia and facial droop started at 2 p.m. on 11/03/2018 which is spontaneously resolved but recurred at 6:30 p.m. His baseline modified Rankin scale was 0, NIHSS at presentation was 14. No intravenous tPA administered as he was outside of window. Head CT showed no evidence of large acute infarct or hemorrhage. CT angiogram of the head and neck showed a proximal right P2/PCA occlusion. CT EXAM: Ultrasound-guided vascular access Diagnostic cerebral angiogram Mechanical thrombectomy Flat panel head CT. COMPARISON:  CT/CT angiogram of the head and neck 11/04/2019. MEDICATIONS: None. ANESTHESIA/SEDATION: General anesthesia performed. CONTRAST:   96 mL of Omnipaque 240 FLUOROSCOPY TIME:  Fluoroscopy Time: 49 minutes 0 seconds (1874 mGy). COMPLICATIONS: None immediate. TECHNIQUE: Informed written consent was obtained from the patient next of kin after a thorough discussion of the procedural risks, benefits and alternatives. All questions were addressed. Maximal Sterile Barrier Technique was utilized including caps, mask, sterile gowns, sterile gloves, sterile drape, hand hygiene and skin antiseptic. A timeout was performed prior to the initiation of the procedure. Real-time ultrasound guidance was utilized for vascular  access including the acquisition of a permanent ultrasound image documenting patency of the accessed vessel. Using the modified Seldinger technique and a micropuncture kit, access was gained to the distal right radial artery at the anatomical snuffbox and a 7 French sheath was placed. Then, a right radial artery roadmap was obtained via sheath side port. Normal brachial artery branching pattern seen. No significant anatomical variation. The right radial artery caliber is adequate for vascular access. At under fluoroscopy, a 5 French vert catheter was navigated over a 0.035 inch Terumo Glidewire into the right subclavian artery. The right subclavian roadmap was obtained and the the catheter was exchanged over the wire for a 7 [place] Rist catheter which was placed in the distal V2 segment of the right vertebral artery. Frontal and lateral angiograms of the head were then obtained. FINDINGS: There is no occlusion of the proximal P2 segment of the right posterior cerebral artery. PROCEDURE: Under biplane roadmap, a [place] catheter was navigated over a phenom 21 microcatheter and a synchro support microguidewire into the basilar artery. The microcatheter was then navigated over the wire into the right P3/PCA. Then, a 4 x 40 mm solitaire stent retriever was deployed spanning the P1-P2 segment. The device was allowed to intercalated with the  clot for 4 minutes. The microcatheter was removed. The [place] was advanced to the level of the P1 segment and connected to a penumbra aspiration pump. The thrombectomy device and Sofia catheter were removed under constant aspiration. 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 In a similar fashion, another stent retriever pass was performed with the solitaire. Follow-up she angiograms showed persistent occlusion of the right PCA. Under biplane roadmap, a [place] catheter was navigated over a phenom 21 microcatheter and a synchro support microguidewire into the basilar artery. The microcatheter was then navigated over the wire into the right P3/PCA. Then, a 5 x 37 mm embotrap stent retriever was deployed spanning the P1-P2 segment. The device was allowed to intercalated with the clot for 4 minutes. The microcatheter was removed. The [place] was advanced to the level of the P1 segment and connected to a penumbra aspiration pump. The thrombectomy device and Sofia catheter were removed under constant aspiration. 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 Flat panel CT of the head was obtained and post processed in a separate workstation with concurrent attending physician supervision. Selected images were sent to PACS. No evidence of hemorrhagic complication seen. On the biplane roadmap, a 3 max aspiration catheter was navigated over a synchro support microguidewire to the level of occlusion at the right P2 segment. The 3 max was connected to a separation on in contain is aspiration was performed for 5 minutes. The 3 max was subsequently retracted under constant aspiration. A follow-up right vertebral artery angiogram showed occlusion al at the level of the distal P2 segment (P2P). Under biplane roadmap, a [place] catheter was navigated over a phenom 21 microcatheter and a  synchro support microguidewire into the basilar artery. The microcatheter was then navigated over the wire into the right P3/PCA. Then, a 5 x 37 mm embotrap stent retriever was deployed spanning the P1-P2 segment. The device was allowed to intercalated with the clot for 4 minutes. The microcatheter was removed. The [place] was advanced to the level of the P1 segment and connected to a penumbra aspiration pump. The thrombectomy device and Sofia catheter were  removed under constant aspiration. Follow-up right artery angiogram showed to occlusion at the level of the distal P2 segment. Flat panel CT of the head was obtained and post processed in a separate workstation with concurrent attending physician supervision. Selected images were sent to PACS. No evidence of hemorrhagic complication seen. The catheter was subsequently withdrawn. The radial sheath was removed and inflatable band was placed over the access site. 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 PLAN: Transferred to ICU for continued monitoring. Electronically Signed   By: Pedro Earls M.D.   On: 11/06/2019 15:42   CT Code Stroke Cerebral Perfusion with contrast  Result Date: 11/04/2019 CLINICAL DATA:  Visual field cut. EXAM: CT ANGIOGRAPHY HEAD AND NECK CT PERFUSION BRAIN TECHNIQUE: Multidetector CT imaging of the head and neck was performed using the standard protocol during bolus administration of intravenous contrast. Multiplanar CT image reconstructions and MIPs were obtained to evaluate the vascular anatomy. Carotid stenosis measurements (when applicable) are obtained utilizing NASCET criteria, using the distal internal carotid diameter as the denominator. Multiphase CT imaging of the brain was performed following IV bolus contrast injection. Subsequent parametric perfusion maps were calculated using  RAPID software. CONTRAST:  184m OMNIPAQUE IOHEXOL 350 MG/ML SOLN COMPARISON:  None. FINDINGS: CTA NECK FINDINGS SKELETON: There is no bony spinal canal stenosis. No lytic or blastic lesion. OTHER NECK: Normal pharynx, larynx and major salivary glands. No cervical lymphadenopathy. Unremarkable thyroid gland. UPPER CHEST: No pneumothorax or pleural effusion. No nodules or masses. AORTIC ARCH: There is no calcific atherosclerosis of the aortic arch. There is no aneurysm, dissection or hemodynamically significant stenosis of the visualized portion of the aorta. Conventional 3 vessel aortic branching pattern. The visualized proximal subclavian arteries are widely patent. RIGHT CAROTID SYSTEM: Normal without aneurysm, dissection or stenosis. LEFT CAROTID SYSTEM: Normal without aneurysm, dissection or stenosis. VERTEBRAL ARTERIES: Left dominant configuration. Both origins are clearly patent. There is no dissection, occlusion or flow-limiting stenosis to the skull base (V1-V3 segments). CTA HEAD FINDINGS POSTERIOR CIRCULATION: --Vertebral arteries: Normal V4 segments. --Posterior inferior cerebellar arteries (PICA): Patent origins from the vertebral arteries. --Anterior inferior cerebellar arteries (AICA): Patent origins from the basilar artery. --Basilar artery: Normal. --Superior cerebellar arteries: Normal. 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 Normal left. ANTERIOR CIRCULATION: --Intracranial internal carotid arteries: Normal. --Anterior cerebral arteries (ACA): Normal. Both A1 segments are present. Patent anterior communicating artery (a-comm). --Middle cerebral arteries (MCA): Normal. VENOUS SINUSES: As permitted by contrast timing, patent. ANATOMIC VARIANTS: None Review of the MIP images confirms the above findings. CT Brain Perfusion Findings: ASPECTS: On further review of the earlier noncontrast head CT images, ASPECTS is downgraded to 9 based on the presence  hypoattenuation in the right thalamus. CBF (<30%) Volume: 017mPerfusion (Tmax>6.0s) volume: 3174mismatch Volume: 22m69mfarction Location:No core infarct. The area of ischemic penumbra is in the right occipital lobe. IMPRESSION: 1. 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 No other intracranial arterial occlusion or high-grade stenosis. Critical Value/emergent results were called by telephone at the time of interpretation on 11/04/2019 at 4:43 am to provider [facility] verb[facility] acknowledged these results. 2. 31 mL ischemic penumbra in the right occipital lobe without core infarct. Electronically Signed   By: KeviUlyses Jarred.   On: 11/04/2019 04:49   ECHOCARDIOGRAM COMPLETE  Result Date: 11/05/2019    ECHOCARDIOGRAM REPORT  Patient Name:   ZYLER HYSON Date of Exam: 11/05/2019 Medical Rec #:  [id number]     Height: Accession #:    [id number]    Weight: Date of Birth:  1960-08-21    BSA: Patient Age:    59 years      BP:           135/87 mmHg Patient Gender: M             HR:           64 bpm. Exam Location:  Inpatient Procedure: 2D Echo, Cardiac Doppler and Color Doppler Indications:    Stroke  History:        Patient has no prior history of Echocardiogram examinations.  Sonographer:    Clayton Lefort RDCS (AE) Referring Phys: [id number] ERIC LINDZEN  Sonographer Comments: Patient is morbidly obese. Image acquisition challenging due to respiratory motion and Image acquisition challenging due to patient body habitus. IMPRESSIONS  1. Left ventricular ejection fraction, by estimation, is 55 to 60%. The left ventricle has normal function. The left ventricle has no regional wall motion abnormalities. There is moderate asymmetric left ventricular hypertrophy of the basal-septal segment. Left ventricular diastolic parameters are indeterminate.  2. Right ventricle is poorly visualized but appears grossly normal size and mildly reduced systolic function. Tricuspid regurgitation signal is inadequate for  assessing PA pressure.  3. Left atrial size was mildly dilated.  4. Right atrial size was mildly dilated.  5. The mitral valve is normal in structure. No evidence of mitral valve regurgitation.  6. The aortic valve was not well visualized. Aortic valve regurgitation is not visualized. Mild aortic valve sclerosis is present, with no evidence of aortic valve stenosis.  7. Aortic dilatation noted. 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  8. The inferior vena cava is dilated in size with <50% respiratory variability, suggesting right atrial pressure of 15 mmHg. FINDINGS  Left Ventricle: Left ventricular ejection fraction, by estimation, is 55 to 60%. The left ventricle has normal function. The left ventricle has no regional wall motion abnormalities. The left ventricular internal cavity size was normal in size. There is  moderate asymmetric left ventricular hypertrophy of the basal-septal segment. Left ventricular diastolic parameters are indeterminate. Right Ventricle: The right ventricular size is normal. Right vetricular wall thickness was not assessed. Right ventricular systolic function is mildly reduced. Tricuspid regurgitation signal is inadequate for assessing PA pressure. Left Atrium: Left atrial size was mildly dilated. Right Atrium: Right atrial size was mildly dilated. Pericardium: There is no evidence of pericardial effusion. Mitral Valve: The mitral valve is normal in structure. No evidence of mitral valve regurgitation. MV peak gradient, 3.9 mmHg. The mean mitral valve gradient is 2.0 mmHg. Tricuspid Valve: The tricuspid valve is grossly normal. Tricuspid valve regurgitation is not demonstrated. Aortic Valve: The aortic valve was not well visualized. Aortic valve regurgitation is not visualized. Mild aortic valve sclerosis is present, with no evidence of aortic valve stenosis. Aortic valve mean gradient measures 3.4 mmHg. Aortic valve peak gradient measures 6.2 mmHg. Aortic valve  area, by VTI measures 2.44 cm. Pulmonic Valve: The pulmonic valve was not well visualized. Pulmonic valve regurgitation is not visualized. Aorta: Aortic dilatation noted. 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 Venous: The inferior vena cava is dilated in size with less than 50% respiratory variability, suggesting right atrial pressure of 15 mmHg. IAS/Shunts: The interatrial septum was not well visualized.  LEFT VENTRICLE PLAX  2D LVIDd:         4.20 cm LVIDs:         2.90 cm LV PW:         1.20 cm LV IVS:        1.30 cm LVOT diam:     2.10 cm LV SV:         56 LVOT Area:     3.46 cm  RIGHT VENTRICLE            IVC RV Basal diam:  3.40 cm    IVC diam: 2.70 cm RV S prime:     8.27 cm/s TAPSE (M-mode): 2.2 cm LEFT ATRIUM           RIGHT ATRIUM LA diam:      4.10 cm RA Area:     22.90 cm LA Vol (A2C): 79.2 ml RA Volume:   61.80 ml LA Vol (A4C): 83.5 ml  AORTIC VALVE AV Area (Vmax):    2.36 cm AV Area (Vmean):   2.27 cm AV Area (VTI):     2.44 cm AV Vmax:           124.20 cm/s AV Vmean:          85.900 cm/s AV VTI:            0.230 m AV Peak Grad:      6.2 mmHg AV Mean Grad:      3.4 mmHg LVOT Vmax:         84.68 cm/s LVOT Vmean:        56.260 cm/s LVOT VTI:          0.162 m LVOT/AV VTI ratio: 0.70  AORTA Ao Root diam: 3.90 cm Ao Asc diam:  3.70 cm MITRAL VALVE MV Area (PHT): 2.06 cm  SHUNTS MV Peak grad:  3.9 mmHg  Systemic VTI:  0.16 m MV Mean grad:  2.0 mmHg  Systemic Diam: 2.10 cm MV Vmax:       0.98 m/s MV Vmean:      58.3 cm/s Oswaldo Milian MD Electronically signed by Oswaldo Milian MD Signature Date/Time: 11/05/2019/10:04:00 PM    Final    IR PERCUTANEOUS ART THROMBECTOMY/INFUSION INTRACRANIAL INC DIAG ANGIO  Result Date: 11/06/2019 INDICATION: 59 year old male with past medical history significant for hypertension, tobacco and alcohol abuse. He presented to the ED with sudden onset of dysarthria, left hemiplegia and facial droop started at 2 p.m. on 11/03/2018 which  is spontaneously resolved but recurred at 6:30 p.m. His baseline modified Rankin scale was 0, NIHSS at presentation was 14. No intravenous tPA administered as he was outside of window. Head CT showed no evidence of large acute infarct or hemorrhage. CT angiogram of the head and neck showed a proximal right P2/PCA occlusion. CT EXAM: Ultrasound-guided vascular access Diagnostic cerebral angiogram Mechanical thrombectomy Flat panel head CT. COMPARISON:  CT/CT angiogram of the head and neck 11/04/2019. MEDICATIONS: None. ANESTHESIA/SEDATION: General anesthesia performed. CONTRAST:  96 mL of Omnipaque 240 FLUOROSCOPY TIME:  Fluoroscopy Time: 49 minutes 0 seconds (1874 mGy). COMPLICATIONS: None immediate. TECHNIQUE: Informed written consent was obtained from the patient next of kin after a thorough discussion of the procedural risks, benefits and alternatives. All questions were addressed. Maximal Sterile Barrier Technique was utilized including caps, mask, sterile gowns, sterile gloves, sterile drape, hand hygiene and skin antiseptic. A timeout was performed prior to the initiation of the procedure. Real-time ultrasound guidance was utilized for vascular access including the acquisition of a permanent  ultrasound image documenting patency of the accessed vessel. Using the modified Seldinger technique and a micropuncture kit, access was gained to the distal right radial artery at the anatomical snuffbox and a 7 French sheath was placed. Then, a right radial artery roadmap was obtained via sheath side port. Normal brachial artery branching pattern seen. No significant anatomical variation. The right radial artery caliber is adequate for vascular access. At under fluoroscopy, a 5 [place] vert catheter was navigated over a 0.035 inch Terumo Glidewire into the right subclavian artery. The right subclavian roadmap was obtained and the the catheter was exchanged over the wire for a 7 [place] Rist catheter which was placed in the  distal V2 segment of the right vertebral artery. Frontal and lateral angiograms of the head were then obtained. FINDINGS: There is no occlusion of the proximal P2 segment of the right posterior cerebral artery. PROCEDURE: Under biplane roadmap, a [place] catheter was navigated over a phenom 21 microcatheter and a synchro support microguidewire into the basilar artery. The microcatheter was then navigated over the wire into the right P3/PCA. Then, a 4 x 40 mm solitaire stent retriever was deployed spanning the P1-P2 segment. The device was allowed to intercalated with the clot for 4 minutes. The microcatheter was removed. The [place] was advanced to the level of the P1 segment and connected to a penumbra aspiration pump. The thrombectomy device and Sofia catheter were removed under constant aspiration. 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 In a similar fashion, another stent retriever pass was performed with the solitaire. Follow-up she angiograms showed persistent occlusion of the right PCA. Under biplane roadmap, a [place] catheter was navigated over a phenom 21 microcatheter and a synchro support microguidewire into the basilar artery. The microcatheter was then navigated over the wire into the right P3/PCA. Then, a 5 x 37 mm embotrap stent retriever was deployed spanning the P1-P2 segment. The device was allowed to intercalated with the clot for 4 minutes. The microcatheter was removed. The [place] was advanced to the level of the P1 segment and connected to a penumbra aspiration pump. The thrombectomy device and Sofia catheter were removed under constant aspiration. 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 Flat panel CT of the head was obtained and post processed in a separate workstation with concurrent attending physician supervision. Selected images were  sent to PACS. No evidence of hemorrhagic complication seen. On the biplane roadmap, a 3 max aspiration catheter was navigated over a synchro support microguidewire to the level of occlusion at the right P2 segment. The 3 max was connected to a separation on in contain is aspiration was performed for 5 minutes. The 3 max was subsequently retracted under constant aspiration. A follow-up right vertebral artery angiogram showed occlusion al at the level of the distal P2 segment (P2P). Under biplane roadmap, a [place] catheter was navigated over a phenom 21 microcatheter and a synchro support microguidewire into the basilar artery. The microcatheter was then navigated over the wire into the right P3/PCA. Then, a 5 x 37 mm embotrap stent retriever was deployed spanning the P1-P2 segment. The device was allowed to intercalated with the clot for 4 minutes. The microcatheter was removed. The [place] was advanced to the level of the P1 segment and connected to a penumbra aspiration pump. The thrombectomy device and Sofia catheter were removed under constant aspiration. Follow-up right artery  angiogram showed to occlusion at the level of the distal P2 segment. Flat panel CT of the head was obtained and post processed in a separate workstation with concurrent attending physician supervision. Selected images were sent to PACS. No evidence of hemorrhagic complication seen. The catheter was subsequently withdrawn. The radial sheath was removed and inflatable band was placed over the access site. 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 PLAN: Transferred to ICU for continued monitoring. Electronically Signed   By: Pedro Earls M.D.   On: 11/06/2019 15:42   CT HEAD CODE STROKE WO CONTRAST  Result Date: 11/04/2019 CLINICAL DATA:  Code stroke.  Slurred speech and left-sided  numbness EXAM: CT HEAD WITHOUT CONTRAST TECHNIQUE: Contiguous axial images were obtained from the base of the skull through the vertex without intravenous contrast. COMPARISON:  None. FINDINGS: Brain: There is no mass, hemorrhage or extra-axial collection. The size and configuration of the ventricles and extra-axial CSF spaces are normal. The brain parenchyma is normal, without evidence of acute or chronic infarction. Vascular: No abnormal hyperdensity of the major intracranial arteries or dural venous sinuses. No intracranial atherosclerosis. Skull: The visualized skull base, calvarium and extracranial soft tissues are normal. Sinuses/Orbits: No fluid levels or advanced mucosal thickening of the visualized paranasal sinuses. No mastoid or middle ear effusion. The orbits are normal. ASPECTS [facility] Stroke Program Early CT Score) - Ganglionic level infarction (caudate, lentiform nuclei, internal capsule, insula, M1-M3 cortex): 7 - Supraganglionic infarction (M4-M6 cortex): 3 Total score (0-10 with 10 being normal): 10 IMPRESSION: 1. Normal brain 2. ASPECTS is 10 3. These results were communicated to Dr. Kerney Elbe at 4:16 am on 11/04/2019 by text page via the [facility] messaging system. Electronically Signed   By: Ulyses Jarred M.D.   On: 11/04/2019 04:17   VAS [place] LOWER EXTREMITY VENOUS (DVT)  Result Date: 11/04/2019  Lower Venous DVTStudy Indications: Stroke.  Limitations: Movement. Comparison Study: No prior study on file Performing Technologist: Sharion Dove RVS  Examination Guidelines: A complete evaluation includes B-mode imaging, spectral Doppler, color Doppler, and power Doppler as needed of all accessible portions of each vessel. Bilateral testing is considered an integral part of a complete examination. Limited examinations for reoccurring indications may be performed as noted. The reflux portion of the exam is performed with the patient in reverse Trendelenburg.   +---------+---------------+---------+-----------+----------+--------------+ RIGHT    CompressibilityPhasicitySpontaneityPropertiesThrombus Aging +---------+---------------+---------+-----------+----------+--------------+ CFV      Full           Yes      Yes                                 +---------+---------------+---------+-----------+----------+--------------+ SFJ      Full                                                        +---------+---------------+---------+-----------+----------+--------------+ FV Prox  Full                                                        +---------+---------------+---------+-----------+----------+--------------+  FV Mid   Full                                                        +---------+---------------+---------+-----------+----------+--------------+ FV DistalFull                                                        +---------+---------------+---------+-----------+----------+--------------+ PFV      Full                                                        +---------+---------------+---------+-----------+----------+--------------+ POP      Full           Yes      Yes                                 +---------+---------------+---------+-----------+----------+--------------+ PTV      Full                                                        +---------+---------------+---------+-----------+----------+--------------+ PERO     Full                                                        +---------+---------------+---------+-----------+----------+--------------+   +---------+---------------+---------+-----------+----------+--------------+ LEFT     CompressibilityPhasicitySpontaneityPropertiesThrombus Aging +---------+---------------+---------+-----------+----------+--------------+ CFV      Full           Yes      Yes                                  +---------+---------------+---------+-----------+----------+--------------+ SFJ      Full                                                        +---------+---------------+---------+-----------+----------+--------------+ FV Prox  Full                                                        +---------+---------------+---------+-----------+----------+--------------+ FV Mid   Full                                                        +---------+---------------+---------+-----------+----------+--------------+  FV DistalFull                                                        +---------+---------------+---------+-----------+----------+--------------+ PFV      Full                                                        +---------+---------------+---------+-----------+----------+--------------+ POP      Full           Yes      Yes                                 +---------+---------------+---------+-----------+----------+--------------+ PTV      Full                                                        +---------+---------------+---------+-----------+----------+--------------+ PERO     Full                                                        +---------+---------------+---------+-----------+----------+--------------+     Summary: BILATERAL: - No evidence of deep vein thrombosis seen in the lower extremities, bilaterally.  RIGHT: - A cystic structure is found in the popliteal fossa.  LEFT: - No cystic structure found in the popliteal fossa.  *See table(s) above for measurements and observations. Electronically signed by Monica Martinez MD on 11/04/2019 at 3:12:03 PM.    Final     Labs:  CBC: Recent Labs    11/04/19 0415 11/04/19 0419 11/04/19 1310 11/05/19 0552 11/06/19 0619 11/07/19 0434  WBC 5.6  --   --  7.6 6.2 7.6  HGB 16.5   < > 13.9 12.9* 12.4* 13.7  HCT 47.9   < > 41.0 38.3* 36.2* 39.1  PLT 231  --   --  216 179 194   < > = values in  this interval not displayed.    COAGS: Recent Labs    11/04/19 0415  INR 1.0  APTT 24    BMP: Recent Labs    11/04/19 0415 11/04/19 0415 11/04/19 0419 11/04/19 0739 11/04/19 1310 11/05/19 0552 11/06/19 0619 11/07/19 0434  NA 135   < > 137   < > 137 134* 131* 132*  K 4.4   < > 4.3   < > 4.1 4.0 3.8 3.7  CL 103   < > 103  --   --  102 100 98  CO2 24  --   --   --   --  23 21* 24  GLUCOSE 112*   < > 107*  --   --  99 93 104*  BUN 8   < > 9  --   --  '10 8 8  '$ CALCIUM 9.3  --   --   --   --  8.5* 8.3* 8.6*  CREATININE 0.85   < > 0.80  --   --  0.76 0.67 0.72  GFRNONAA >60  --   --   --   --  >60 >60 >60  GFRAA >60  --   --   --   --  >60 >60 >60   < > = values in this interval not displayed.    LIVER FUNCTION TESTS: Recent Labs    11/04/19 0415  BILITOT 0.8  AST 24  ALT 41  ALKPHOS 72  PROT 7.1  ALBUMIN 3.9    Assessment and Plan:  History of CVA s/p cerebral arteriogram with emergent mechanical thrombectomy of right PCA P2 occlusion achieving a TICI 2A revascularization 11/04/2019 by Dr. Karenann Cai. Patient's condition stable- lethargic but responds to voice (probably secondary to withdrawal symptoms), primarily keeps eyes closed during conversation, can spontaneously move left side with minimal spontaneous movements of right side. Right radial incision stable, radial pulses 2+ bilaterally. Further plans per neurology- appreciate and agree with management. Please call NIR with questions/concerns.   Electronically Signed: Earley Abide, PA-C 11/07/2019, 9:57 AM   I spent a total of 25 Minutes at the the patient's bedside AND on the patient's hospital floor or unit, greater than 50% of which was counseling/coordinating care for CVA s/p revascularization.

## 2019-11-07 NOTE — Evaluation (Signed)
Clinical/Bedside Swallow Evaluation Patient Details  Name: Nicholas Estes MRN: [id number] Date of Birth: 07-Apr-1961  Today's Date: 11/07/2019 Time: SLP Start Time (ACUTE ONLY): 1011 SLP Stop Time (ACUTE ONLY): 1021 SLP Time Calculation (min) (ACUTE ONLY): 10 min  Past Medical History:  Past Medical History:  Diagnosis Date  . Hypertension    Past Surgical History:  Past Surgical History:  Procedure Laterality Date  . IR CT HEAD LTD  11/04/2019  . IR CT HEAD LTD  11/04/2019  . IR PERCUTANEOUS ART THROMBECTOMY/INFUSION INTRACRANIAL INC DIAG ANGIO  11/04/2019  . IR US GUIDE VASC ACCESS RIGHT  11/04/2019  . RADIOLOGY WITH ANESTHESIA N/A 11/04/2019   Procedure: IR WITH ANESTHESIA;  Surgeon: Luanne Bras, MD;  Location: [facility];  Service: Radiology;  Laterality: N/A;   HPI:  Pt is a 59 y.o. male presenting from home with acute onset of dysarthria, left hemiplegia and left facial droop. MRI brain 11/04/19: Acute/subacute nonhemorrhagic infarct involving the right thalamus, right posterior temporal and occipital lobe, right para mesencephalic cistern, and posterior right medulla. Pt s/p cerebral mechanical thrombectomy of right PCA P2 occlusion and revascularization. Pt developed progressive left-sided weakness and new right-sided weakness on 4/18 and repeat MRI showed new acute/subacute nonhemorrhagic infarcts involving the posterior right midbrain and high posterior left occipital lobe.   Assessment / Plan / Recommendation Clinical Impression    Pt shows signs of a possible oropharyngeal dysphagia, including multiple subswallows per bolus, intermittent coughing, and inhalation immediately post-swallow followed by increased RR after sips of water in particular that could be concerning for coordination of breath/swallow. In addition to the above, he is also drowsy and does not open his eyes spontaneously to anticipate boluses unless cued to do so. Given the above as well as locations of infarcts, he  will likely benefit from [facility]. He does have nutritional access at the moment ([employer]), so given his drowsiness, I think he would benefit from additional time before scheduling. He could continue to have ice chips in moderation after oral care for now if alert enough.    SLP Visit Diagnosis: Dysphagia, unspecified (R13.10)    Aspiration Risk  Moderate aspiration risk    Diet Recommendation NPO;Alternative means - temporary;Ice chips PRN after oral care   Medication Administration: Via alternative means    Other  Recommendations Oral Care Recommendations: Oral care QID   Follow up Recommendations Inpatient Rehab      Frequency and Duration min 2x/week  2 weeks       Prognosis Prognosis for Safe Diet Advancement: Good Barriers to Reach Goals: Cognitive deficits      Swallow Study   General HPI: Pt is a 59 y.o. male presenting from home with acute onset of dysarthria, left hemiplegia and left facial droop. MRI brain 11/04/19: Acute/subacute nonhemorrhagic infarct involving the right thalamus, right posterior temporal and occipital lobe, right para mesencephalic cistern, and posterior right medulla. Pt s/p cerebral mechanical thrombectomy of right PCA P2 occlusion and revascularization. Pt developed progressive left-sided weakness and new right-sided weakness on 4/18 and repeat MRI showed new acute/subacute nonhemorrhagic infarcts involving the posterior right midbrain and high posterior left occipital lobe. Type of Study: Bedside Swallow Evaluation Previous Swallow Assessment: none in chart Diet Prior to this Study: NPO;NG Tube Temperature Spikes Noted: No Respiratory Status: Nasal cannula History of Recent Intubation: No Behavior/Cognition: Lethargic/Drowsy;Cooperative;Requires cueing Oral Cavity Assessment: Within Functional Limits Oral Care Completed by SLP: Yes Oral Cavity - Dentition: Adequate natural dentition Vision: (keeps eyes closed unless cued  to open ) Self-Feeding  Abilities: Total assist Patient Positioning: Upright in bed Baseline Vocal Quality: Normal Volitional Swallow: Able to elicit    Oral/Motor/Sensory Function     Ice Chips Ice chips: Within functional limits Presentation: Spoon   Thin Liquid Thin Liquid: Impaired Presentation: Spoon;Straw Pharyngeal  Phase Impairments: Multiple swallows;Cough - Delayed;Change in Vital Signs    Nectar Thick Nectar Thick Liquid: Not tested   Honey Thick Honey Thick Liquid: Not tested   Puree Puree: Impaired Presentation: Spoon Pharyngeal Phase Impairments: Multiple swallows   Solid     Solid: Not tested      Mahala Menghini., M.A. CCC-SLP Acute Rehabilitation Services Pager [phone] Office [phone]  11/07/2019,1:23 PM

## 2019-11-07 NOTE — Progress Notes (Signed)
Physical Therapy Treatment Patient Details Name: Nicholas Estes MRN: [id number] DOB: 1960-11-16 Today's Date: 11/07/2019    History of Present Illness  59 y.o. male presenting from home with acute onset of dysarthria, left hemiplegia and left facial droop. Delayed coming to ED therefore no tPA. NIHSS 14 Interventional radiology for right PCA occlusion with minimal recanalization completed. + new onset afib MRI-bilateral PCA scattered infarcts and medullary infarcts, small right paramesencephalic cistern SAH PMHx-alcohol and tobacco abuse; HTN, obesity,  Pt then had a change in neuro status while in hospital and was found to have New cute/subacute nonhemorrhagic infarcts involving the posterior.    PT Comments    Pt with improved participation, command following, and alertness. Pt remains to have bilat UE and LE weakness and requiring total A for EOB. Pt continuing to require mod/maxA to maintain EOB balance, pt did attempt to hold midline however unable. Cont to recommend CIR Upon d/c for maximal functional recovery. Acute PT to cont to follow.    Follow Up Recommendations  CIR     Equipment Recommendations  (TBD at next venue)    Recommendations for Other Services Rehab consult     Precautions / Restrictions Precautions Precautions: Fall Precaution Comments: bilat hemiplegia Restrictions Weight Bearing Restrictions: No    Mobility  Bed Mobility Overal bed mobility: Needs Assistance Bed Mobility: Rolling;Sidelying to Sit;Sit to Sidelying Rolling: Total assist;+2 for physical assistance Sidelying to sit: Total assist;+2 for physical assistance     Sit to sidelying: Total assist;+2 for physical assistance General bed mobility comments: pt requiring assist for LE management due to pain in hips/groin and in ability to use UEs functionally to pull/push, total assist for trunk elevation  Transfers                 General transfer comment: no appropriate at this  time  Ambulation/Gait             General Gait Details: unable at this time   Stairs             Wheelchair Mobility    Modified Rankin (Stroke Patients Only) Modified Rankin (Stroke Patients Only) Pre-Morbid Rankin Score: No symptoms Modified Rankin: Severe disability     Balance Overall balance assessment: Needs assistance Sitting-balance support: Feet supported;No upper extremity supported Sitting balance-Leahy Scale: Zero Sitting balance - Comments: pt unable to use either L or R UE to support self despite max verbal and tactile cues. pt with L lateral lean and gravity would make him fall forward Postural control: Left lateral lean     Standing balance comment: unable at this time                            Cognition Arousal/Alertness: Lethargic(sleeping upon arrival but was arousable) Behavior During Therapy: WFL for tasks assessed/performed Overall Cognitive Status: Impaired/Different from baseline Area of Impairment: Problem solving;Safety/judgement;Following commands                       Following Commands: Follows one step commands with increased time;Follows multi-step commands inconsistently Safety/Judgement: Decreased awareness of safety;Decreased awareness of deficits   Problem Solving: Slow processing;Decreased initiation;Difficulty sequencing;Requires verbal cues;Requires tactile cues General Comments: once seated EOB pt more alert, still required verbal cues to maintain eyes opened but was conversant. Pt with delayed response time but followed simple commands majority of time      Exercises General Exercises - [place] [employer]  Quad: AAROM;Both;10 reps;Seated    General Comments General comments (skin integrity, edema, etc.): VSS      Pertinent Vitals/Pain Pain Assessment: Faces Faces Pain Scale: Hurts worst Pain Location: grimacing and moaning with R hip/LE movement, but also states his whole body  hurts Pain Descriptors / Indicators: Grimacing Pain Intervention(s): Monitored during session    Home Living                      Prior Function            PT Goals (current goals can now be found in the care plan section) Acute Rehab PT Goals Patient Stated Goal: unable to state Progress towards PT goals: Progressing toward goals    Frequency    Min 4X/week      PT Plan Current plan remains appropriate    Co-evaluation              AM-PAC PT "6 Clicks" Mobility   Outcome Measure  Help needed turning from your back to your side while in a flat bed without using bedrails?: Total Help needed moving from lying on your back to sitting on the side of a flat bed without using bedrails?: Total Help needed moving to and from a bed to a chair (including a wheelchair)?: Total Help needed standing up from a chair using your arms (e.g., wheelchair or bedside chair)?: Total Help needed to walk in hospital room?: Total Help needed climbing 3-5 steps with a railing? : Total 6 Click Score: 6    End of Session   Activity Tolerance: Patient tolerated treatment well Patient left: in bed;with call bell/phone within reach;with bed alarm set;with nursing/sitter in room Nurse Communication: Mobility status PT Visit Diagnosis: Hemiplegia and hemiparesis Hemiplegia - Right/Left: Right Hemiplegia - dominant/non-dominant: Dominant Hemiplegia - caused by: Cerebral infarction     Time: 6789-3810 PT Time Calculation (min) (ACUTE ONLY): 26 min  Charges:  $Therapeutic Activity: 8-22 mins $Neuromuscular Re-education: 8-22 mins                     Nicholas Estes, PT, DPT Acute Rehabilitation Services Pager #: [phone] Office #: [phone]    [name] 11/07/2019, 12:32 PM

## 2019-11-07 NOTE — Progress Notes (Signed)
  Speech Language Pathology Treatment: Cognitive-Linquistic  Patient Details Name: Nicholas Estes MRN: [id number] DOB: 1961/02/23 Today's Date: 11/07/2019 Time: 1607-3710 SLP Time Calculation (min) (ACUTE ONLY): 14 min  Assessment / Plan / Recommendation Clinical Impression  Pt has a moderate dysarthria, for which he needs Mod cues for repetitions, primarily at the phrase level. Pt is oriented to self and location, but often makes jokes or speaks tangentially when asked any other questions. Mod cues were provided for redirection throughout session. He will continue to benefit from additional SLP f/u especially targeting differential diagnosis of cognitive abilities as he becomes more alert.    HPI HPI: Pt is a 59 y.o. male presenting from home with acute onset of dysarthria, left hemiplegia and left facial droop. MRI brain 11/04/19: Acute/subacute nonhemorrhagic infarct involving the right thalamus, right posterior temporal and occipital lobe, right para mesencephalic cistern, and posterior right medulla. Pt s/p cerebral mechanical thrombectomy of right PCA P2 occlusion and revascularization. Pt developed progressive left-sided weakness and new right-sided weakness on 4/18 and repeat MRI showed new acute/subacute nonhemorrhagic infarcts involving the posterior right midbrain and high posterior left occipital lobe.      SLP Plan  Continue with current plan of care       Recommendations  Medication Administration: Via alternative means                Oral Care Recommendations: Oral care QID Follow up Recommendations: Inpatient Rehab SLP Visit Diagnosis: Dysarthria and anarthria (R47.1);Cognitive communication deficit (R41.841) Plan: Continue with current plan of care       GO                 Mahala Menghini., M.A. CCC-SLP Acute Rehabilitation Services Pager [phone] Office [phone]  11/07/2019, 2:36 PM

## 2019-11-07 NOTE — Progress Notes (Signed)
STROKE TEAM PROGRESS NOTE   INTERVAL HISTORY Wife and son are at bedside. Pt seems more awake than yesterday, he did not received any ativan overnight. However, he still pretty lethargic, able to answer limited questions, but not orientated to time. Still has right arm and leg weaker than left. Did not pass swallow, continue tube feeding.    OBJECTIVE Vitals:   11/07/19 0700 11/07/19 0744 11/07/19 0800 11/07/19 0900  BP: (!) 131/97  (!) 127/92 (!) 142/94  Pulse: 82  92 99  Resp: 14  17 (!) 21  Temp:   98.8 F (37.1 C)   TempSrc:      SpO2: 100% 100% 100%   Weight:      Height:        CBC:  Recent Labs  Lab 11/04/19 0415 11/04/19 0419 11/06/19 0619 11/07/19 0434  WBC 5.6   < > 6.2 7.6  NEUTROABS 3.5  --   --   --   HGB 16.5   < > 12.4* 13.7  HCT 47.9   < > 36.2* 39.1  MCV 95.2   < > 96.3 93.5  PLT 231   < > 179 194   < > = values in this interval not displayed.    Basic Metabolic Panel:  Recent Labs  Lab 11/06/19 0619 11/06/19 0619 11/06/19 1625 11/07/19 0434  NA 131*  --   --  132*  K 3.8  --   --  3.7  CL 100  --   --  98  CO2 21*  --   --  24  GLUCOSE 93  --   --  104*  BUN 8  --   --  8  CREATININE 0.67  --   --  0.72  CALCIUM 8.3*  --   --  8.6*  MG 1.8   < > 1.9 2.0  PHOS 2.5   < > 2.5 3.1   < > = values in this interval not displayed.    Lipid Panel:     Component Value Date/Time   CHOL 128 11/05/2019 0552   TRIG 82 11/05/2019 0552   HDL 37 (L) 11/05/2019 0552   CHOLHDL 3.5 11/05/2019 0552   VLDL 16 11/05/2019 0552   LDLCALC 75 11/05/2019 0552   HgbA1c:  Lab Results  Component Value Date   HGBA1C 6.0 (H) 11/05/2019   Urine Drug Screen:     Component Value Date/Time   LABOPIA NONE DETECTED 11/04/2019 1927   COCAINSCRNUR NONE DETECTED 11/04/2019 1927   LABBENZ NONE DETECTED 11/04/2019 1927   AMPHETMU NONE DETECTED 11/04/2019 1927   THCU NONE DETECTED 11/04/2019 1927   LABBARB NONE DETECTED 11/04/2019 1927    Alcohol Level No results  found for: ETH  IMAGING CT HEAD CODE STROKE WO CONTRAST 11/04/2019 IMPRESSION:  1. Normal brain  2. ASPECTS is 10   CT Code Stroke CTA Head W/WO contrast CT Code Stroke CTA Neck W/WO contrast CT Code Stroke Cerebral Perfusion with contrast 11/04/2019 IMPRESSION:  1. 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 No other intracranial arterial occlusion or high-grade stenosis.  2. 31 mL ischemic penumbra in the right occipital lobe without core infarct.   MRI Brain WO Contrast 11/04/2019 IMPRESSION: 1. Acute/subacute nonhemorrhagic infarct involving the right thalamus, right posterior temporal and occipital lobe, and right para mesencephalic cistern. 2. Decreased reperfusion of the right PCA branch. 3. Mild diffuse sinus disease.  CT Head WO Contrast 11/04/19 IMPRESSION: 1. Small amount of  subarachnoid hemorrhage within the right para mesencephalic cistern and sitting just below the tentorium on the right. 2. Evolving posteroinferior PCA territory infarct. 3. The medullary infarct is below the resolution of this study.  MR BRAIN WO CONTRAST 11/05/2019 1. New acute/subacute nonhemorrhagic infarcts involving the posterior right midbrain and high posterior left occipital lobe. 2. The posteromedial right PCA infarct has become more confluent. No hemorrhagic conversion. 3. Stable right posterior medullary infarct. 4. Small focus of subarachnoid hemorrhage in the right para mesencephalic cistern is improving. No new hemorrhage is present.   ECHOCARDIOGRAM COMPLETE 11/05/2019 1. Left ventricular ejection fraction, by estimation, is 55 to 60%. The left ventricle has normal function. The left ventricle has no regional wall motion abnormalities. There is moderate asymmetric left ventricular hypertrophy of the basal-septal segment. Left ventricular diastolic parameters are indeterminate.  2. Right ventricle is poorly visualized but appears grossly normal size and mildly reduced systolic  function. Tricuspid regurgitation signal is inadequate for assessing PA pressure.  3. Left atrial size was mildly dilated.  4. Right atrial size was mildly dilated.  5. The mitral valve is normal in structure. No evidence of mitral valve regurgitation.  6. The aortic valve was not well visualized. Aortic valve regurgitation is not visualized. Mild aortic valve sclerosis is present, with no evidence of aortic valve stenosis.  7. Aortic dilatation noted. 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  8. The inferior vena cava is dilated in size with <50% respiratory variability, suggesting right atrial pressure of 15 mmHg.    Bilateral Lower Extremity Venous Dopplers 11/04/19 No DVT bilaterally  ECG - atrial fibrillation - ventricular response 103 BPM (See cardiology reading for complete details)   PHYSICAL EXAM   Temp:  [98.3 F (36.8 C)-99.4 F (37.4 C)] 98.8 F (37.1 C) (04/20 0800) Pulse Rate:  [82-107] 99 (04/20 0900) Resp:  [14-24] 21 (04/20 0900) BP: (115-151)/(76-106) 142/94 (04/20 0900) SpO2:  [88 %-100 %] 100 % (04/20 0800) Weight:  [107.3 kg] 107.3 kg (04/19 1752)  General - Well nourished, well developed, lethargic but more awake than yesterday.  Ophthalmologic - fundi not visualized due to noncooperation.  Cardiovascular - irregularly irregular heart rate and rhythm.  Neuro - lethargic but more awake than yesterday, able to answer limited questions, orientated to his age and people but not to time. Severe dysarthria but able to speak in short sentences. Not cooperative on naming or repetition. Left hemianopia, not blinking to visual threat on the left. Eyes right gaze preference, left gaze incomplete, limited upward or downward gaze. Left facial droop. Tongue protrusion midline. However, LUE 3+/5 and RUE flaccid 0/5. LLE 3/5 on pain and RLE 2-/5 with pain stimulation. Sensation, coordination not cooperative and gait not tested.   ASSESSMENT/PLAN Mr. Tarvaris Puglia is a 59 y.o. male with history of hypertension presenting with acute onset of dysarthria, left hemiplegia and left facial droop. He did not receive IV t-PA due to late presentation (>4.5 hours from time of onset) IR - Right P2A occlusion. Multiple passes performed (2x solitaire, 3x embotrap, !x aspiration) with minimal recanalization to the level of the P2P segment (TICI2A).   Stroke: Posterior multifocal infarcts s/p unsuccessful thrombectomy with postprocedure small SAH- embolic likely due to new diagnosed atrial fibrillation with new B posterior infarcts in hospital  CT Head - Normal brain. ASPECTS is 10   CTA H&N - Occlusion of the right PCA at the proximal P2 segment.   CT Perfusion - positive for  penumbra  IR unsuccessful right P2 thrombectomy  MRI head 11/04/19 - bilateral PCA scattered infarcts and medullary infarcts, small right paramesencephalic cistern SAH  CT 0/24/09 - confirmed small right perimesencephalic cistern SAH  MRI 4/19 new posterior R midbrain and high posterior L occipital lobe infarcts. R PCA infarct w/o hemorrhagic transformation. Small  R paramesencephalic cistern SAH improving.    2D Echo - EF 55-60%. No source of embolus. LA mildly dilated  LE venous Doppler negative for DVT  Lacey Jensen Virus 2 - negative  LDL 75  HgbA1c 6.0  UDS - negative  VTE prophylaxis - Heparin 5000 units sq tid   No antithrombotic prior to admission, now ok for aspirin 81 mg daily. Will consider DOAC 5-7 days post stroke.   Therapy recommendations:  CIR  Disposition:  Pending  Worsening left sided weakness - BP dependent  BP low 4/17-> improved after IVF and albumin  IV bolus 1L  Albumin Q6h x 4  Repeat MRI brain shows new R midbrain infarct and high posterior L occipital lobe infarcts, right PCA infarct extension and right medullary and high cervical cord infarct extension  BP goal 120-160  on midodrine 5mg  tid   Atrial Fibrillation - new diagnosis  EKG  captured afib  Persistent afib on tele  Rate controlled at this time  Will consider metoprolol if RVR  Ok for aspirin 81  Unable to anticoagulate at this time due to [facility]  Alcohol abuse  Heavy drinker at home as per son  CIWA protocol  Received heavy ativan due to withdraw symptoms -> no ativan overnight  Vitamin B1, multivitamin, folic acid  Seizure precaution  Hypotension  Hx of hypertension  Home BP meds: Amlodipine 10  Current BP meds: none  Was low end and now improved to 120s-140s . BP goal 120-160 . On midodrine  Tobacco abuse  Current smoker  Smoking cessation counseling provided  Nicotine patch provided  Pt is willing to quit  Dysphagia . Secondary to stroke . Did not pass swallow . NPO . Cortrak placed w/ TF started . Speech on board   Other Stroke Risk Factors  Obesity, Body mass index is 32.99 kg/m., recommend weight loss, diet and exercise as appropriate   Other Active Problems  Code status - Full code  Hospital day # 3  This patient is critically ill due to multifocal posterior circulation infarcts, postprocedure SAH, new onset A. Fib, neuro worsening, heavy alcohol user, smoker and at significant risk of neurological worsening, death form recurrent posterior circulation strokes, hemorrhagic conversion, enlargement of SAH, delirium tremor, alcohol withdrawal seizure, heart failure. This patient's care requires constant monitoring of vital signs, hemodynamics, respiratory and cardiac monitoring, review of multiple databases, neurological assessment, discussion with family, other specialists and medical decision making of high complexity. I spent 40 minutes of neurocritical care time in the care of this patient. I had long discussion with girlfriend and son at bedside, updated pt current condition, reviewed neuro images, treatment plan and potential prognosis, and answered all the questions. They expressed understanding and appreciation.    Rosalin Hawking, MD PhD Stroke Neurology 11/07/2019 10:36 AM   To contact Stroke Continuity provider, please refer to [email or web address] After hours, contact General Neurology

## 2019-11-08 ENCOUNTER — Inpatient Hospital Stay (HOSPITAL_COMMUNITY)

## 2019-11-08 LAB — CBC
HCT: 39.7 % (ref 39.0–52.0)
Hemoglobin: 13.8 g/dL (ref 13.0–17.0)
MCH: 31.7 pg (ref 26.0–34.0)
MCHC: 34.8 g/dL (ref 30.0–36.0)
MCV: 91.3 fL (ref 80.0–100.0)
Platelets: 220 10*3/uL (ref 150–400)
RBC: 4.35 MIL/uL (ref 4.22–5.81)
RDW: 12 % (ref 11.5–15.5)
WBC: 8.2 10*3/uL (ref 4.0–10.5)
nRBC: 0 % (ref 0.0–0.2)

## 2019-11-08 LAB — BASIC METABOLIC PANEL
Anion gap: 9 (ref 5–15)
BUN: 10 mg/dL (ref 6–20)
CO2: 25 mmol/L (ref 22–32)
Calcium: 8.7 mg/dL — ABNORMAL LOW (ref 8.9–10.3)
Chloride: 98 mmol/L (ref 98–111)
Creatinine, Ser: 0.69 mg/dL (ref 0.61–1.24)
GFR calc Af Amer: >60 mL/min (ref >60)
GFR calc non Af Amer: >60 mL/min (ref >60)
Glucose, Bld: 134 mg/dL — ABNORMAL HIGH (ref 70–99)
Potassium: 3.9 mmol/L (ref 3.5–5.1)
Sodium: 132 mmol/L — ABNORMAL LOW (ref 135–145)

## 2019-11-08 LAB — GLUCOSE, CAPILLARY
Glucose-Capillary: 143 mg/dL — ABNORMAL HIGH (ref 70–99)
Glucose-Capillary: 135 mg/dL — ABNORMAL HIGH (ref 70–99)
Glucose-Capillary: 154 mg/dL — ABNORMAL HIGH (ref 70–99)
Glucose-Capillary: 127 mg/dL — ABNORMAL HIGH (ref 70–99)
Glucose-Capillary: 140 mg/dL — ABNORMAL HIGH (ref 70–99)
Glucose-Capillary: 115 mg/dL — ABNORMAL HIGH (ref 70–99)

## 2019-11-08 MED ORDER — PANTOPRAZOLE SODIUM 40 MG PO PACK
40.0000 mg | PACK | Freq: Every day | ORAL | Status: DC
  Administered 2019-11-08: 40 mg
  Filled 2019-11-08: qty 20

## 2019-11-08 MED ORDER — ALBUTEROL SULFATE (2.5 MG/3ML) 0.083% IN NEBU
2.5000 mg | INHALATION_SOLUTION | Freq: Three times a day (TID) | RESPIRATORY_TRACT | Status: DC
  Administered 2019-11-08 – 2019-11-09 (×2): 2.5 mg via RESPIRATORY_TRACT
  Filled 2019-11-08 (×2): qty 3

## 2019-11-08 MED ORDER — ASPIRIN 81 MG PO CHEW
324.0000 mg | CHEWABLE_TABLET | Freq: Every day | ORAL | Status: DC
  Filled 2019-11-08: qty 4

## 2019-11-08 NOTE — Progress Notes (Signed)
  Speech Language Pathology Treatment: Dysphagia  Patient Details Name: Nicholas Estes MRN: [id number] DOB: 10/10/1960 Today's Date: 11/08/2019 Time: 0912-0930 SLP Time Calculation (min) (ACUTE ONLY): 18 min  Assessment / Plan / Recommendation Clinical Impression  Pt is still drowsy today but with mild improvements in alertness, opening his eyes all the way when cued to do so and keeping them open longer. RN says he has been requesting something to drink. Signs of dysphagia persist, including multiple subswallows and coughing that is noted primarily with thin liquids. Mod cues were provided for smaller bolus sizes, which did not alleviate signs of dysphagia. PO intake also elicited hiccups. MBS is indicated before initiation of PO diet. Given improving alertness, will tentatively plan for MBS this afternoon.    HPI HPI: Pt is a 59 y.o. male presenting from home with acute onset of dysarthria, left hemiplegia and left facial droop. MRI brain 11/04/19: Acute/subacute nonhemorrhagic infarct involving the right thalamus, right posterior temporal and occipital lobe, right para mesencephalic cistern, and posterior right medulla. Pt s/p cerebral mechanical thrombectomy of right PCA P2 occlusion and revascularization. Pt developed progressive left-sided weakness and new right-sided weakness on 4/18 and repeat MRI showed new acute/subacute nonhemorrhagic infarcts involving the posterior right midbrain and high posterior left occipital lobe.      SLP Plan  MBS       Recommendations  Diet recommendations: NPO Medication Administration: Via alternative means                Oral Care Recommendations: Oral care QID Follow up Recommendations: Inpatient Rehab SLP Visit Diagnosis: Cognitive communication deficit (R41.841);Dysphagia, unspecified (R13.10) Plan: MBS       GO                 Mahala Menghini., M.A. CCC-SLP Acute Rehabilitation Services Pager [phone] Office  [phone]  11/08/2019, 10:08 AM

## 2019-11-08 NOTE — Progress Notes (Signed)
PT Cancellation Note  Patient Details Name: Nicholas Estes MRN: [id number] DOB: [birth year]   Cancelled Treatment:    Reason Eval/Treat Not Completed: Medical issues which prohibited therapy. RN reporting pt has been agitated and attempting to pull all lines and leads recently. Requesting PT hold until pt is more calm and safely able to participate in mobility. PT will attempt to follow up as time allows.   Arlyss Gandy 11/08/2019, 5:17 PM

## 2019-11-08 NOTE — Progress Notes (Signed)
STROKE TEAM PROGRESS NOTE   INTERVAL HISTORY Son and RN at bedside. Pt more awake alert today, although still lethargic, no ativan needs overnight. Orientated to place and person but not to time. RUE strength improving.   OBJECTIVE Vitals:   11/08/19 1000 11/08/19 1100 11/08/19 1130 11/08/19 1200  BP: (!) 142/90 116/68 140/69   Pulse: 94 91    Resp:      Temp:    99.2 F (37.3 C)  TempSrc:    Axillary  SpO2: 100% 100% 98%   Weight:      Height:        CBC:  Recent Labs  Lab 11/04/19 0415 11/04/19 0419 11/07/19 0434 11/08/19 0629  WBC 5.6   < > 7.6 8.2  NEUTROABS 3.5  --   --   --   HGB 16.5   < > 13.7 13.8  HCT 47.9   < > 39.1 39.7  MCV 95.2   < > 93.5 91.3  PLT 231   < > 194 220   < > = values in this interval not displayed.    Basic Metabolic Panel:  Recent Labs  Lab 11/07/19 0434 11/07/19 1640 11/08/19 0629  NA 132*  --  132*  K 3.7  --  3.9  CL 98  --  98  CO2 24  --  25  GLUCOSE 104*  --  134*  BUN 8  --  10  CREATININE 0.72  --  0.69  CALCIUM 8.6*  --  8.7*  MG 2.0 2.0  --   PHOS 3.1 2.8  --     Lipid Panel:     Component Value Date/Time   CHOL 128 11/05/2019 0552   TRIG 82 11/05/2019 0552   HDL 37 (L) 11/05/2019 0552   CHOLHDL 3.5 11/05/2019 0552   VLDL 16 11/05/2019 0552   LDLCALC 75 11/05/2019 0552   HgbA1c:  Lab Results  Component Value Date   HGBA1C 6.0 (H) 11/05/2019   Urine Drug Screen:     Component Value Date/Time   LABOPIA NONE DETECTED 11/04/2019 1927   COCAINSCRNUR NONE DETECTED 11/04/2019 1927   LABBENZ NONE DETECTED 11/04/2019 1927   AMPHETMU NONE DETECTED 11/04/2019 1927   THCU NONE DETECTED 11/04/2019 1927   LABBARB NONE DETECTED 11/04/2019 1927    Alcohol Level No results found for: ETH  IMAGING CT HEAD CODE STROKE WO CONTRAST 11/04/2019 IMPRESSION:  1. Normal brain  2. ASPECTS is 10   CT Code Stroke CTA Head W/WO contrast CT Code Stroke CTA Neck W/WO contrast CT Code Stroke Cerebral Perfusion with  contrast 11/04/2019 IMPRESSION:  1. 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 No other intracranial arterial occlusion or high-grade stenosis.  2. 31 mL ischemic penumbra in the right occipital lobe without core infarct.   MRI Brain WO Contrast 11/04/2019 IMPRESSION: 1. Acute/subacute nonhemorrhagic infarct involving the right thalamus, right posterior temporal and occipital lobe, and right para mesencephalic cistern. 2. Decreased reperfusion of the right PCA branch. 3. Mild diffuse sinus disease.  CT Head WO Contrast 11/04/19 IMPRESSION: 1. Small amount of subarachnoid hemorrhage within the right para mesencephalic cistern and sitting just below the tentorium on the right. 2. Evolving posteroinferior PCA territory infarct. 3. The medullary infarct is below the resolution of this study.  MR BRAIN WO CONTRAST 11/05/2019 1. New acute/subacute nonhemorrhagic infarcts involving the posterior right midbrain and high posterior left occipital lobe. 2. The posteromedial right PCA infarct has become more confluent.  No hemorrhagic conversion. 3. Stable right posterior medullary infarct. 4. Small focus of subarachnoid hemorrhage in the right para mesencephalic cistern is improving. No new hemorrhage is present.   ECHOCARDIOGRAM COMPLETE 11/05/2019 1. Left ventricular ejection fraction, by estimation, is 55 to 60%. The left ventricle has normal function. The left ventricle has no regional wall motion abnormalities. There is moderate asymmetric left ventricular hypertrophy of the basal-septal segment. Left ventricular diastolic parameters are indeterminate.  2. Right ventricle is poorly visualized but appears grossly normal size and mildly reduced systolic function. Tricuspid regurgitation signal is inadequate for assessing PA pressure.  3. Left atrial size was mildly dilated.  4. Right atrial size was mildly dilated.  5. The mitral valve is normal in structure. No evidence of mitral valve  regurgitation.  6. The aortic valve was not well visualized. Aortic valve regurgitation is not visualized. Mild aortic valve sclerosis is present, with no evidence of aortic valve stenosis.  7. Aortic dilatation noted. 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  8. The inferior vena cava is dilated in size with <50% respiratory variability, suggesting right atrial pressure of 15 mmHg.    Bilateral Lower Extremity Venous Dopplers 11/04/19 No DVT bilaterally  ECG - atrial fibrillation - ventricular response 103 BPM (See cardiology reading for complete details)   PHYSICAL EXAM   Temp:  [98 F (36.7 C)-99.2 F (37.3 C)] 99.2 F (37.3 C) (04/21 1200) Pulse Rate:  [81-108] 91 (04/21 1100) Resp:  [15-32] 15 (04/21 0900) BP: (116-147)/(68-106) 140/69 (04/21 1130) SpO2:  [86 %-100 %] 98 % (04/21 1130) FiO2 (%):  [64 %] 64 % (04/21 0336)  General - Well nourished, well developed, lethargic but more awake.  Ophthalmologic - fundi not visualized due to noncooperation.  Cardiovascular - irregularly irregular heart rate and rhythm.  Neuro - lethargic but more awake alert, orientated to place and people but not to time. Severe dysarthria but able to speak in short sentences. Able to name 2/4 and able to repeat in dysarthric voice. Left hemianopia, not blinking to visual threat on the left. Eyes right gaze preference, left gaze incomplete, limited upward or downward gaze. Left facial droop. Tongue protrusion midline. However, LUE 4/5 and RUE flaccid 3-/5 proximal and 1/5 distal. LLE 3/5 spontaneously and RLE 2-/5 with pain stimulation. Sensation subjectively symmetrical but not reliable, coordination not cooperative and gait not tested.   ASSESSMENT/PLAN Mr. Brodrick Curran is a 59 y.o. male with history of hypertension presenting with acute onset of dysarthria, left hemiplegia and left facial droop. He did not receive IV t-PA due to late presentation (>4.5 hours from time of  onset) IR - Right P2A occlusion. Multiple passes performed (2x solitaire, 3x embotrap, !x aspiration) with minimal recanalization to the level of the P2P segment (TICI2A).   Stroke: Posterior multifocal infarcts s/p unsuccessful thrombectomy with postprocedure small SAH- embolic likely due to new diagnosed atrial fibrillation with new B posterior infarcts in hospital  CT Head - Normal brain. ASPECTS is 10   CTA H&N - Occlusion of the right PCA at the proximal P2 segment.   CT Perfusion - positive for penumbra  IR unsuccessful right P2 thrombectomy  MRI head 11/04/19 - bilateral PCA scattered infarcts and medullary infarcts, small right paramesencephalic cistern SAH  CT 11/04/19 - confirmed small right perimesencephalic cistern SAH  MRI 4/19 new posterior R midbrain and high posterior L occipital lobe infarcts. R PCA infarct w/o hemorrhagic transformation. Small  R paramesencephalic cistern SAH improving.  MRI C-spine pending  2D Echo - EF 55-60%. No source of embolus. LA mildly dilated  LE venous Doppler negative for DVT  Loyal Jacobson Virus 2 - negative  LDL 75  HgbA1c 6.0  UDS - negative  VTE prophylaxis - Heparin 5000 units sq tid   No antithrombotic prior to admission, now ok for aspirin 324 mg daily. Will consider DOAC 5-7 days post stroke.   Therapy recommendations:  CIR  Disposition:  Pending  Transfer to the floor  Worsening left sided weakness - BP dependent  BP low 4/17-> improved after IVF and albumin  IV bolus 1L  Albumin Q6h x 4  Repeat MRI brain shows new R midbrain infarct and high posterior L occipital lobe infarcts, right PCA infarct extension and right medullary and high cervical cord infarct extension  MRI C-spine pending  BP goal 120-160  on midodrine 5mg  tid   Atrial Fibrillation - new diagnosis  EKG captured afib  Persistent afib on tele  Rate controlled at this time  Will consider metoprolol if RVR  Ok for aspirin 324  Will  consider DOAC 5-7 days post stroke.  Respiratory distress   4/20 requiring O2 and albuterol nebs.   CXR on 4/17 w/ stable cardiomegaly w/o overt edema.   On chest PT  NT suction PRN  Stable 4/21  Alcohol abuse  Heavy drinker at home as per son  CIWA protocol  Received heavy ativan due to withdraw symptoms -> no ativan overnight x 2  Vitamin B1, multivitamin, folic acid  Seizure precaution  Hypotension  Hx of hypertension  Home BP meds: Amlodipine 10  Current BP meds: none  Was low end and now improved to 120s-140s . BP goal 120-160 . On midodrine 5  Tobacco abuse  Current smoker  Smoking cessation counseling provided  Nicotine patch provided  Pt is willing to quit  Dysphagia . Secondary to stroke . On dysphagia 3 and thin - but not eating much . Cortrak in place - may consider nocturnal feeding to encourage po intake . Speech on board   Other Stroke Risk Factors  Obesity, Body mass index is 32.99 kg/m., recommend weight loss, diet and exercise as appropriate   Other Active Problems  Code status - Full code   Hospital day # 4  This patient is critically ill due to multifocal posterior circulation infarcts, postprocedure SAH, new onset A. Fib, neuro worsening, heavy alcohol user, smoker and at significant risk of neurological worsening, death form recurrent posterior circulation strokes, hemorrhagic conversion, enlargement of SAH, delirium tremor, alcohol withdrawal seizure, heart failure. This patient's care requires constant monitoring of vital signs, hemodynamics, respiratory and cardiac monitoring, review of multiple databases, neurological assessment, discussion with family, other specialists and medical decision making of high complexity. I spent 35 minutes of neurocritical care time in the care of this patient. I had long discussion with son at bedside, updated pt current condition, reviewed neuro images, treatment plan and potential prognosis,  and answered all the questions. He expressed understanding and appreciation.   5/21, MD PhD Stroke Neurology 11/08/2019 1:55 PM   To contact Stroke Continuity provider, please refer to 11/10/2019. After hours, contact General Neurology

## 2019-11-08 NOTE — Progress Notes (Signed)
Modified Barium Swallow Progress Note  Patient Details  Name: Nicholas Estes MRN: [id number] Date of Birth: [birth year]  Today's Date: 11/08/2019  Modified Barium Swallow completed.  Full report located under Chart Review in the Imaging Section.  Brief recommendations include the following:  Clinical Impression  Patient presents with mild oropharyngeal dysphagia secondary to a timing deficit. Oral phase is remarkable for trace lingual residue, but clears with multiple swallows and/or a liquid wash. Patient's hyoid movement/mobility was noted to be reduced. Penetration (PAS 2) occured during the swallow with thin liquids secondary to a timing deficit as the pt was challeneged with large consecutive sips. No aspiration was observed. Given the pt's current mentation and fluctuating alert state, recommend starting Dys 3 diet and thin liquids. Enusre the pt has full supervision while eating/drinking and is alert/awake before offering POs. Will upgrade diet as clinically appropriate, and as pt's mentation/alert state improve.   Swallow Evaluation Recommendations       SLP Diet Recommendations: Dysphagia 3 (Mech soft) solids;Thin liquid   Liquid Administration via: Cup;Straw   Medication Administration: Whole meds with liquid   Supervision: Full supervision/cueing for compensatory strategies   Compensations: Slow rate;Small sips/bites   Postural Changes: Seated upright at 90 degrees   Oral Care Recommendations: Oral care BID       Maudry Mayhew, Student SLP Office: [phone]  11/08/2019,3:04 PM

## 2019-11-09 DIAGNOSIS — I9589 Other hypotension: Secondary | ICD-10-CM

## 2019-11-09 LAB — BASIC METABOLIC PANEL
Anion gap: 9 (ref 5–15)
BUN: 10 mg/dL (ref 6–20)
CO2: 27 mmol/L (ref 22–32)
Calcium: 8.9 mg/dL (ref 8.9–10.3)
Chloride: 97 mmol/L — ABNORMAL LOW (ref 98–111)
Creatinine, Ser: 0.76 mg/dL (ref 0.61–1.24)
GFR calc Af Amer: >60 mL/min (ref >60)
GFR calc non Af Amer: >60 mL/min (ref >60)
Glucose, Bld: 107 mg/dL — ABNORMAL HIGH (ref 70–99)
Potassium: 4 mmol/L (ref 3.5–5.1)
Sodium: 133 mmol/L — ABNORMAL LOW (ref 135–145)

## 2019-11-09 LAB — GLUCOSE, CAPILLARY
Glucose-Capillary: 106 mg/dL — ABNORMAL HIGH (ref 70–99)
Glucose-Capillary: 106 mg/dL — ABNORMAL HIGH (ref 70–99)
Glucose-Capillary: 120 mg/dL — ABNORMAL HIGH (ref 70–99)
Glucose-Capillary: 110 mg/dL — ABNORMAL HIGH (ref 70–99)

## 2019-11-09 LAB — CBC
HCT: 39.3 % (ref 39.0–52.0)
Hemoglobin: 13.6 g/dL (ref 13.0–17.0)
MCH: 32.7 pg (ref 26.0–34.0)
MCHC: 34.6 g/dL (ref 30.0–36.0)
MCV: 94.5 fL (ref 80.0–100.0)
Platelets: 223 10*3/uL (ref 150–400)
RBC: 4.16 MIL/uL — ABNORMAL LOW (ref 4.22–5.81)
RDW: 12.4 % (ref 11.5–15.5)
WBC: 6.2 10*3/uL (ref 4.0–10.5)
nRBC: 0 % (ref 0.0–0.2)

## 2019-11-09 MED ORDER — ADULT MULTIVITAMIN W/MINERALS CH
1.0000 | ORAL_TABLET | Freq: Every day | ORAL | Status: DC
  Administered 2019-11-09 – 2019-11-15 (×7): 1 via ORAL
  Filled 2019-11-09 (×6): qty 1

## 2019-11-09 MED ORDER — THIAMINE HCL 100 MG/ML IJ SOLN
100.0000 mg | Freq: Every day | INTRAMUSCULAR | Status: DC
  Administered 2019-11-09: 100 mg via INTRAVENOUS

## 2019-11-09 MED ORDER — LORAZEPAM 2 MG/ML IJ SOLN
1.0000 mg | Freq: Four times a day (QID) | INTRAMUSCULAR | Status: AC | PRN
  Administered 2019-11-10: 21:00:00 2 mg via INTRAVENOUS
  Filled 2019-11-09: qty 1

## 2019-11-09 MED ORDER — ASPIRIN 81 MG PO CHEW
324.0000 mg | CHEWABLE_TABLET | Freq: Every day | ORAL | Status: DC
  Administered 2019-11-09 – 2019-11-10 (×2): 324 mg via ORAL
  Filled 2019-11-09: qty 4

## 2019-11-09 MED ORDER — FOLIC ACID 1 MG PO TABS
1.0000 mg | ORAL_TABLET | Freq: Every day | ORAL | Status: DC
  Administered 2019-11-09 – 2019-11-15 (×7): 1 mg via ORAL
  Filled 2019-11-09 (×6): qty 1

## 2019-11-09 MED ORDER — ENSURE ENLIVE PO LIQD
237.0000 mL | Freq: Three times a day (TID) | ORAL | Status: DC
  Administered 2019-11-09 – 2019-11-14 (×5): 237 mL via ORAL
  Filled 2019-11-09: qty 237

## 2019-11-09 MED ORDER — THIAMINE HCL 100 MG PO TABS
100.0000 mg | ORAL_TABLET | Freq: Every day | ORAL | Status: DC
  Administered 2019-11-10 – 2019-11-15 (×6): 100 mg via ORAL
  Filled 2019-11-09 (×6): qty 1

## 2019-11-09 MED ORDER — LORAZEPAM 1 MG PO TABS
1.0000 mg | ORAL_TABLET | Freq: Four times a day (QID) | ORAL | Status: AC | PRN
  Administered 2019-11-09: 2 mg via ORAL
  Filled 2019-11-09: qty 2

## 2019-11-09 MED ORDER — PANTOPRAZOLE SODIUM 40 MG PO TBEC
40.0000 mg | DELAYED_RELEASE_TABLET | Freq: Every day | ORAL | Status: DC
  Administered 2019-11-09 – 2019-11-15 (×7): 40 mg via ORAL
  Filled 2019-11-09 (×7): qty 1

## 2019-11-09 MED ORDER — SENNOSIDES-DOCUSATE SODIUM 8.6-50 MG PO TABS: 1.0000 | ORAL_TABLET | Freq: Every evening | ORAL | Status: DC | PRN

## 2019-11-09 MED ORDER — MIDODRINE HCL 5 MG PO TABS
5.0000 mg | ORAL_TABLET | Freq: Three times a day (TID) | ORAL | Status: DC
  Administered 2019-11-09 – 2019-11-15 (×18): 5 mg via ORAL
  Filled 2019-11-09 (×18): qty 1

## 2019-11-09 MED ORDER — ALBUTEROL SULFATE (2.5 MG/3ML) 0.083% IN NEBU: 2.5000 mg | INHALATION_SOLUTION | RESPIRATORY_TRACT | Status: DC | PRN

## 2019-11-09 MED ORDER — LORAZEPAM 1 MG PO TABS: 1.0000 mg | ORAL_TABLET | ORAL | Status: DC | PRN

## 2019-11-09 MED ORDER — LORAZEPAM 2 MG/ML IJ SOLN: 1.0000 mg | INTRAMUSCULAR | Status: DC | PRN

## 2019-11-09 NOTE — Progress Notes (Signed)
Nutrition Follow-up  DOCUMENTATION CODES:   Obesity unspecified  INTERVENTION:   Ensure Enlive po BID, each supplement provides 350 kcal and 20 grams of protein  Encourage PO intake  NUTRITION DIAGNOSIS:   Inadequate oral intake related to lethargy/confusion as evidenced by meal completion < 50%. Ongoing.   GOAL:   Patient will meet greater than or equal to 90% of their needs Progressing.   MONITOR:   PO intake, Supplement acceptance  REASON FOR ASSESSMENT:   Consult Enteral/tube feeding initiation and management  ASSESSMENT:   Pt with PMH of HTN and ETOH abuse now admitted with posterior multifocal infarcts likely embolic due to newly dx afib (no tPA due to late presentation) s/p unsuccessful thrombectomy with post procedure small SAH.    4/19 cortrak placed for nutrition support 4/21 s/p MBS started on dysphagia 3 diet with thin liquids 4/22 pt agitated, cortrak pulled out by pt early this am    Medications reviewed and include: folic acid, MVI, thiamine Labs reviewed: Na 133 (L)   Diet Order:   Diet Order            DIET DYS 3 Room service appropriate? Yes with Assist; Fluid consistency: Thin  Diet effective now              EDUCATION NEEDS:   No education needs have been identified at this time  Skin:  Skin Assessment: Reviewed RN Assessment  Last BM:  4/21 type 7; large  Height:   Ht Readings from Last 1 Encounters:  11/06/19 5\' 11"  (1.803 m)    Weight:   Wt Readings from Last 1 Encounters:  11/06/19 107.3 kg    Ideal Body Weight:     BMI:  Body mass index is 32.99 kg/m.  Estimated Nutritional Needs:   Kcal:  2200-2400  Protein:  120-135 grams  Fluid:  2 L/day  11/08/19., RD, LDN, CNSC See AMiON for contact information

## 2019-11-09 NOTE — Progress Notes (Signed)
  Speech Language Pathology Treatment: Dysphagia;Cognitive-Linquistic  Patient Details Name: Nicholas Estes MRN: [id number] DOB: [birth year] Today's Date: 11/09/2019 Time: 0835-0900 SLP Time Calculation (min) (ACUTE ONLY): 25 min  Assessment / Plan / Recommendation Clinical Impression  Pt is more alert today and opening his eyes more throughout the session compared to previous visits. He recalled MBS from previous date with Min cues for broad details of testing. His speech is clearer as well, needing Min cues for repetitions today. He ate graham crackers with liquid wash with Min cues provided for smaller sips/slower pacing. Intermittent coughing was noted during PO trials, perhaps more so after using the water as a liquid wash, but he was also noted to cough before PO trials were introduced. RN reports no coughing this morning on other thin liquid trials. Recommend continuing Dys 3 diet and thin liquids with assistance needed during meals. SLP will continue to follow acutely with recommendations for CIR upon discharge.    HPI HPI: Pt is a 59 y.o. male presenting from home with acute onset of dysarthria, left hemiplegia and left facial droop. MRI brain 11/04/19: Acute/subacute nonhemorrhagic infarct involving the right thalamus, right posterior temporal and occipital lobe, right para mesencephalic cistern, and posterior right medulla. Pt s/p cerebral mechanical thrombectomy of right PCA P2 occlusion and revascularization. Pt developed progressive left-sided weakness and new right-sided weakness on 4/18 and repeat MRI showed new acute/subacute nonhemorrhagic infarcts involving the posterior right midbrain and high posterior left occipital lobe.      SLP Plan  Continue with current plan of care       Recommendations  Diet recommendations: Dysphagia 3 (mechanical soft);Thin liquid Liquids provided via: Cup;Straw Medication Administration: Whole meds with liquid Supervision: Staff to assist with  self feeding Compensations: Slow rate;Small sips/bites Postural Changes and/or Swallow Maneuvers: Seated upright 90 degrees                Oral Care Recommendations: Oral care BID Follow up Recommendations: Inpatient Rehab SLP Visit Diagnosis: Dysphagia, oropharyngeal phase (R13.12) Plan: Continue with current plan of care       GO                Mahala Menghini., M.A. CCC-SLP Acute Rehabilitation Services Pager [phone] Office [phone]  11/09/2019, 9:27 AM

## 2019-11-09 NOTE — Progress Notes (Signed)
STROKE TEAM PROGRESS NOTE   INTERVAL HISTORY Girlfriend and son and RN are at bedside. Pt sitting in bed, more awake alert, joking around, however, still complains of back pain and leg cramping with pain, more on the right leg. His right arm and leg weakness much improved. MRI C-spine showed upper cervical cord infarct on the right.   OBJECTIVE Vitals:   11/09/19 0500 11/09/19 0600 11/09/19 0738 11/09/19 0800  BP: (!) 148/103 (!) 146/90    Pulse: 93 71    Resp: 19 16    Temp:    (!) 97.4 F (36.3 C)  TempSrc:    Axillary  SpO2: 94% (!) 89% 93%   Weight:      Height:        CBC:  Recent Labs  Lab 11/04/19 0415 11/04/19 0419 11/08/19 0629 11/09/19 0616  WBC 5.6   < > 8.2 6.2  NEUTROABS 3.5  --   --   --   HGB 16.5   < > 13.8 13.6  HCT 47.9   < > 39.7 39.3  MCV 95.2   < > 91.3 94.5  PLT 231   < > 220 223   < > = values in this interval not displayed.    Basic Metabolic Panel:  Recent Labs  Lab 11/07/19 0434 11/07/19 0434 11/07/19 1640 11/08/19 0629 11/09/19 0616  NA 132*   < >  --  132* 133*  K 3.7   < >  --  3.9 4.0  CL 98   < >  --  98 97*  CO2 24   < >  --  25 27  GLUCOSE 104*   < >  --  134* 107*  BUN 8   < >  --  10 10  CREATININE 0.72   < >  --  0.69 0.76  CALCIUM 8.6*   < >  --  8.7* 8.9  MG 2.0  --  2.0  --   --   PHOS 3.1  --  2.8  --   --    < > = values in this interval not displayed.    Lipid Panel:     Component Value Date/Time   CHOL 128 11/05/2019 0552   TRIG 82 11/05/2019 0552   HDL 37 (L) 11/05/2019 0552   CHOLHDL 3.5 11/05/2019 0552   VLDL 16 11/05/2019 0552   LDLCALC 75 11/05/2019 0552   HgbA1c:  Lab Results  Component Value Date   HGBA1C 6.0 (H) 11/05/2019   Urine Drug Screen:     Component Value Date/Time   LABOPIA NONE DETECTED 11/04/2019 1927   COCAINSCRNUR NONE DETECTED 11/04/2019 1927   LABBENZ NONE DETECTED 11/04/2019 1927   AMPHETMU NONE DETECTED 11/04/2019 1927   THCU NONE DETECTED 11/04/2019 1927   LABBARB NONE  DETECTED 11/04/2019 1927    Alcohol Level No results found for: ETH  IMAGING CT HEAD CODE STROKE WO CONTRAST 11/04/2019 IMPRESSION:  1. Normal brain  2. ASPECTS is 10   CT Code Stroke CTA Head W/WO contrast CT Code Stroke CTA Neck W/WO contrast CT Code Stroke Cerebral Perfusion with contrast 11/04/2019 IMPRESSION:  1. 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 No other intracranial arterial occlusion or high-grade stenosis.  2. 31 mL ischemic penumbra in the right occipital lobe without core infarct.   MRI Brain WO Contrast 11/04/2019 IMPRESSION: 1. Acute/subacute nonhemorrhagic infarct involving the right thalamus, right posterior temporal and occipital lobe, and right para  mesencephalic cistern. 2. Decreased reperfusion of the right PCA branch. 3. Mild diffuse sinus disease.  CT Head WO Contrast 11/04/19 IMPRESSION: 1. Small amount of subarachnoid hemorrhage within the right para mesencephalic cistern and sitting just below the tentorium on the right. 2. Evolving posteroinferior PCA territory infarct. 3. The medullary infarct is below the resolution of this study.  MR BRAIN WO CONTRAST 11/05/2019 1. New acute/subacute nonhemorrhagic infarcts involving the posterior right midbrain and high posterior left occipital lobe. 2. The posteromedial right PCA infarct has become more confluent. No hemorrhagic conversion. 3. Stable right posterior medullary infarct. 4. Small focus of subarachnoid hemorrhage in the right para mesencephalic cistern is improving. No new hemorrhage is present.   ECHOCARDIOGRAM COMPLETE 11/05/2019 1. Left ventricular ejection fraction, by estimation, is 55 to 60%. The left ventricle has normal function. The left ventricle has no regional wall motion abnormalities. There is moderate asymmetric left ventricular hypertrophy of the basal-septal segment. Left ventricular diastolic parameters are indeterminate.  2. Right ventricle is poorly visualized but  appears grossly normal size and mildly reduced systolic function. Tricuspid regurgitation signal is inadequate for assessing PA pressure.  3. Left atrial size was mildly dilated.  4. Right atrial size was mildly dilated.  5. The mitral valve is normal in structure. No evidence of mitral valve regurgitation.  6. The aortic valve was not well visualized. Aortic valve regurgitation is not visualized. Mild aortic valve sclerosis is present, with no evidence of aortic valve stenosis.  7. Aortic dilatation noted. 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  8. The inferior vena cava is dilated in size with <50% respiratory variability, suggesting right atrial pressure of 15 mmHg.    Bilateral Lower Extremity Venous Dopplers 11/04/19 No DVT bilaterally  ECG - atrial fibrillation - ventricular response 103 BPM (See cardiology reading for complete details)  MR CERVICAL SPINE WO CONTRAST 11/08/2019 1. Motion degraded examination. 2. Known acute to subacute infarcts involving the right greater than left PCA territories and brainstem including the medulla and upper cervical spinal cord. 3. Cervical spondylosis with severe neural foraminal stenosis at C4-5, C5-6, and C6-7. 4. Mild spinal stenosis at C3-4 and C5-6.     PHYSICAL EXAM   Temp:  [97.4 F (36.3 C)-99.2 F (37.3 C)] 97.4 F (36.3 C) (04/22 0800) Pulse Rate:  [71-100] 71 (04/22 0600) Resp:  [13-28] 16 (04/22 0600) BP: (108-148)/(67-103) 146/90 (04/22 0600) SpO2:  [85 %-100 %] 93 % (04/22 0738)  General - Well nourished, well developed, mild distress due to back pain.  Ophthalmologic - fundi not visualized due to noncooperation.  Cardiovascular - irregularly irregular heart rate and rhythm.  Neuro - awake alert, orientated to place and people but not to time. moderate dysarthria. Able to name 2/4 and able to repeat. Left hemianopia, not blinking to visual threat on the left. Eyes right gaze preference, left gaze  incomplete. Left facial droop. Tongue protrusion midline. LUE at least 4/5 and RUE 3/5 proximal and distal. LLE 4/5 and RLE 3/5 but painful due to cramping. Sensation subjectively symmetrical but complains of RLE tingling "on fire", coordination intact FTN left hand and gait not tested.   ASSESSMENT/PLAN Nicholas Estes is a 59 y.o. male with history of hypertension presenting with acute onset of dysarthria, left hemiplegia and left facial droop. He did not receive IV t-PA due to late presentation (>4.5 hours from time of onset) IR - Right P2A occlusion. Multiple passes performed (2x solitaire, 3x embotrap, !x aspiration)  with minimal recanalization to the level of the P2P segment (TICI2A).   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 ASPECTS is 10   CTA H&N - Occlusion of the right PCA at the proximal P2 segment.   CT Perfusion - positive for penumbra  IR unsuccessful right P2 thrombectomy  MRI head 11/04/19 - bilateral PCA scattered infarcts and right medullary infarcts, small right paramesencephalic cistern SAH  CT 11/04/19 - confirmed small right perimesencephalic cistern SAH  MRI 4/19 new posterior R midbrain and high posterior L occipital lobe infarcts. R PCA infarct w/o hemorrhagic transformation. Small  R paramesencephalic cistern SAH improving.    MRI C-spine R>L PCA and brainstem infarcts, including medulla and upper spinal cord.   2D Echo - EF 55-60%. No source of embolus. LA mildly dilated  LE venous Doppler negative for DVT  Loyal Jacobson Virus 2 - negative  LDL 75  HgbA1c 6.0  UDS - negative  VTE prophylaxis - Heparin 5000 units sq tid   No antithrombotic prior to admission, now on for aspirin 324 mg daily. Will consider eliquis tomorrow.   Therapy recommendations:  CIR  Disposition:  Pending  Neuro  worsening with left sided weakness - BP dependent  BP low 4/17-> improved after IVF and albumin  IV bolus 1L  Albumin Q6h x 4  Repeat MRI brain shows new R midbrain infarct and high posterior L occipital lobe infarcts, confluent right PCA infarct extension and right medullary and high cervical cord infarct extension  MRI C-spine R>L PCA and brainstem infarcts, including medulla and upper spinal cord.   BP stable at goal  BP goal 120-160  on midodrine 5mg  tid   Atrial Fibrillation - new diagnosis  EKG captured afib  Persistent afib on tele  Rate controlled at this time  Will consider metoprolol if RVR  On for aspirin 324  Will consider eliquis tomorrow  Respiratory distress   4/20 requiring O2 and albuterol nebs.   CXR on 4/17 w/ stable cardiomegaly w/o overt edema.   On chest PT  NT suction PRN  Stable now  Alcohol abuse  Heavy drinker at home as per son  CIWA protocol for another 2 days  Received heavy ativan due to withdraw symptoms -> no ativan overnight x 3  Vitamin B1, multivitamin, folic acid  Seizure precaution  Hypotension  Hx of hypertension  Home BP meds: Amlodipine 10  Current BP meds: none  Was low end and now improved to 120s-140s . BP goal 120-160 . On midodrine 5  Tobacco abuse  Current smoker  Smoking cessation counseling provided  Nicotine patch provided  Pt is willing to quit  Dysphagia . Secondary to stroke . Cortrak out by pt 4/22 early am . On dysphagia 3 and thin . encourage and monitor po intake . Speech on board   Other Stroke Risk Factors  Obesity, Body mass index is 32.99 kg/m., recommend weight loss, diet and exercise as appropriate   Other Active Problems  Code status - Full code   Hospital day # 5   5/22, MD PhD Stroke Neurology 11/09/2019 9:49 AM   To contact Stroke Continuity provider, please refer to 11/11/2019. After hours, contact General Neurology

## 2019-11-09 NOTE — Progress Notes (Signed)
Physical Therapy Treatment Patient Details Name: Nicholas Estes MRN: [id number] DOB: 1961/07/20 Today's Date: 11/09/2019    History of Present Illness  59 y.o. male presenting from home with acute onset of dysarthria, left hemiplegia and left facial droop. Delayed coming to ED therefore no tPA. NIHSS 14 Interventional radiology for right PCA occlusion with minimal recanalization completed. 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     PT Comments    Pt seated in recliner chair when PT came in to work with him.  He has significant bil deficits, can activate single muscle groups one at a time, but has a hard time coordinating movement to produce functional movement of his body.  Thee person assist to get him repositioned back in the recliner to prepare to eat.  I showed his girlfriend, Zella Ball, how to help him, help himself (hand over hand) to eat.       Follow Up Recommendations  CIR     Equipment Recommendations  Hospital bed;Wheelchair cushion (measurements PT);Wheelchair (measurements PT)    Recommendations for Other Services Rehab consult     Precautions / Restrictions Precautions Precautions: Fall Precaution Comments: bilat hemiplegia    Mobility  Bed Mobility               General bed mobility comments: Pt was OOB in the recliner chair.   Transfers Overall transfer level: Needs assistance   Transfers: Sit to/from Stand           General transfer comment: Attempted to use the steady (really to block his knees to see if he could scoot back in the chair, but unable to power up at all  from recliner to even scoot with hand over hand assist.       Modified Rankin (Stroke Patients Only) Modified Rankin (Stroke Patients Only) Pre-Morbid Rankin Score: No symptoms Modified Rankin: Severe disability     Balance Overall balance assessment: Needs assistance Sitting-balance support: Feet supported;Bilateral upper extremity supported;No upper extremity supported;Single extremity supported Sitting balance-Leahy Scale: Zero Sitting balance - Comments: max to total assist to pull forward in chair.  Legs blocked by Pt and positioned under him.  Attempted to find midline sitting posture and to keep balance in unsupported sitting.  Pt able to correct some, leans mostly to the left.  Very little to trace trunk muscle activation at this time.  Hands are too weak to grip the armrests of the chair to help keep sitting balance that way.  Worked on upright head and neck as well as cervical rotation bil.   Postural control: Left lateral lean                                  Cognition Arousal/Alertness: Awake/alert Behavior During Therapy: WFL for tasks assessed/performed Overall Cognitive Status: Impaired/Different from baseline                                 General Comments: Pt making sarcatic jokes today, often deflecting questions with a joke at times  seemingly to give himself extra time to answer the question, which he does accurately given extra time.  i am not so sure he is impulsive as much as he is unable to control his body today.  He is aware he is requiring near total care.       Exercises General Exercises - Lower Extremity Ankle Circles/Pumps: AAROM;Both;10 reps Long Arc Quad: AAROM;Both;5 reps    General Comments General comments (skin integrity, edema, etc.): O2 sats decreased into the 80s on RA, so 2 L O2 [place] returned to nose and O2 returned to the 90s.        Pertinent Vitals/Pain Pain Assessment: Faces Faces Pain Scale: Hurts even  more Pain Location: low back (chronic) and bottom (from being in chair).  Pain Descriptors / Indicators: Aching;Burning Pain Intervention(s): Limited activity within patient's tolerance;Monitored during session;Repositioned           PT Goals (current goals can now be found in the care plan section) Acute Rehab PT Goals Patient Stated Goal: regain function Progress towards PT goals: Progressing toward goals    Frequency    Min 4X/week      PT Plan Current plan remains appropriate       AM-PAC PT "6 Clicks" Mobility   Outcome Measure  Help needed turning from your back to your side while in a flat bed without using bedrails?: Total Help needed moving from lying on your back to sitting on the side of a flat bed without using bedrails?: Total Help needed moving to and from a bed to a chair (including a wheelchair)?: Total Help needed standing up from a chair using your arms (e.g., wheelchair or bedside chair)?: Total Help needed to walk in hospital room?: Total Help needed climbing 3-5 steps with a railing? : Total 6 Click Score: 6    End of Session Equipment Utilized During Treatment: Oxygen Activity Tolerance: Patient limited by pain Patient left: in chair;with call bell/phone within reach;with family/visitor present;with nursing/sitter in room Nurse Communication: Mobility status PT Visit Diagnosis: Hemiplegia and hemiparesis Hemiplegia - Right/Left: Right Hemiplegia - dominant/non-dominant: Dominant Hemiplegia - caused by: Cerebral infarction     Time: 2952-8413 PT Time Calculation (min) (ACUTE ONLY): 38 min  Charges:  $Therapeutic Activity: 38-52 mins            Verdene Lennert, PT, DPT  Acute Rehabilitation #[phone] pager #[phone] office              11/09/2019, 3:23 PM

## 2019-11-10 MED ORDER — APIXABAN 5 MG PO TABS
5.0000 mg | ORAL_TABLET | Freq: Two times a day (BID) | ORAL | Status: DC
  Administered 2019-11-10 – 2019-11-15 (×10): 5 mg via ORAL
  Filled 2019-11-10 (×10): qty 1

## 2019-11-10 NOTE — Progress Notes (Signed)
Physical Therapy Treatment Patient Details Name: Nicholas Estes MRN: [id number] DOB: 1961/07/08 Today's Date: 11/10/2019    History of Present Illness  59 y.o. male presenting from home with acute onset of dysarthria, left hemiplegia and left facial droop. Delayed coming to ED therefore no tPA. NIHSS 14 Interventional radiology for right PCA occlusion with minimal recanalization completed. 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     PT Comments    Patient progressing this session with sitting balance and upright posture as well with self feeding, utilizing compensation for reaching for food and bringing to his mouth while in bed.  Patient fatigued with activity and will need continued skilled PT in the acute setting as well as follow CIR level rehab.    Follow Up Recommendations  CIR     Equipment Recommendations  Hospital bed;Wheelchair cushion (measurements PT);Wheelchair (measurements PT)    Recommendations for Other Services       Precautions / Restrictions Precautions Precautions: Fall Precaution Comments: bilat hemiparesis Restrictions Weight Bearing Restrictions: No    Mobility  Bed Mobility Overal bed mobility: Needs Assistance Bed Mobility: Rolling;Sidelying to Sit Rolling: Max assist;+2 for physical assistance Sidelying to sit: Total assist;+2 for physical assistance     Sit to sidelying: Total assist;+2 for physical assistance General bed mobility comments: cues for pt to help with rolling in the bed, able to flex L knee and reach some with L hand, but moaned loudly when rolled  stating super stiff from being in the bed  Transfers Overall transfer level: Needs assistance   Transfers: Sit to/from Stand Sit to Stand: +2 physical assistance;Total assist         General transfer comment: unable to attempt  Ambulation/Gait                 Stairs             Wheelchair Mobility    Modified Rankin (Stroke Patients Only) Modified Rankin (Stroke Patients Only) Pre-Morbid Rankin Score: No symptoms Modified Rankin: Severe disability     Balance Overall balance assessment: Needs assistance Sitting-balance support: Feet supported;Single extremity supported Sitting balance-Leahy Scale: Zero Sitting balance - Comments: Patient pushing at times to R and needing support posteriorly initially to prevent posterior LOB, improved with less pushing after reaching to L with L UE needing at times mod A. Sat EOB about 10 minutes with initially working on self feeding, but difficulty multitasking; noted improved upright head and neck positioning after cervical ROM Postural control: Right lateral lean     Standing balance comment: unable at this time                            Cognition Arousal/Alertness: Awake/alert Behavior During Therapy: WFL for tasks assessed/performed Overall Cognitive Status: Impaired/Different from baseline Area of Impairment: Attention;Safety/judgement                   Current Attention Level: Sustained   Following Commands: Follows one step commands consistently;Follows one step commands with increased time Safety/Judgement: Decreased awareness of deficits;Decreased awareness of safety   Problem Solving: Decreased  initiation;Requires verbal cues;Requires tactile cues General Comments: work on self feeding needing cues to reach above the table as unaware he was reaching below has sensory loss and L inattention      Exercises Other Exercises Other Exercises: AAROM cervical flex/ext, rotation and lateral  flexion due to stiffness    General Comments General comments (skin integrity, edema, etc.): after returned to bed assisted with hand over hand for self feeding with L UE for about 50% of his meal then assisted with feeding him due to fatigue.  noted needing cues for reaching accurately due to sensory deficits and L inattention educating him on using vision to compensate needing to track further to the L.  asked nurse secretary to get soft touch call bell for pt      Pertinent Vitals/Pain Pain Assessment: Faces Faces Pain Scale: Hurts whole lot Pain Location: "everywhere" due to stiffness from lying in the bed all the time Pain Descriptors / Indicators: Tightness;Aching;Sharp Pain Intervention(s): Monitored during session;Repositioned    Home Living                      Prior Function            PT Goals (current goals can now be found in the care plan section) Progress towards PT goals: Progressing toward goals    Frequency    Min 4X/week      PT Plan Current plan remains appropriate    Co-evaluation              AM-PAC PT "6 Clicks" Mobility   Outcome Measure  Help needed turning from your back to your side while in a flat bed without using bedrails?: A Lot Help needed moving from lying on your back to sitting on the side of a flat bed without using bedrails?: Total Help needed moving to and from a bed to a chair (including a wheelchair)?: Total Help needed standing up from a chair using your arms (e.g., wheelchair or bedside chair)?: Total Help needed to walk in hospital room?: Total Help needed climbing 3-5 steps with a railing? : Total 6 Click Score: 7    End of Session   Activity Tolerance: Patient limited by fatigue;Patient limited by pain Patient left: in bed;with call bell/phone within reach;with bed alarm set Nurse Communication: Mobility status PT Visit Diagnosis: Hemiplegia and hemiparesis Hemiplegia - Right/Left: Right Hemiplegia -  dominant/non-dominant: Dominant Hemiplegia - caused by: Cerebral infarction     Time: 9371-6967 PT Time Calculation (min) (ACUTE ONLY): 46 min  Charges:  $Therapeutic Activity: 23-37 mins $Neuromuscular Re-education: 8-22 mins                     Nicholas Estes, [place] [facility] [phone] 11/10/2019    Nicholas Estes 11/10/2019, 4:45 PM

## 2019-11-10 NOTE — Progress Notes (Signed)
HR = 73, MEWS score back to zero from to md aware

## 2019-11-10 NOTE — Progress Notes (Addendum)
Had a 5 second pause on rhythm strip during sleep. Continues to be in a-fib.   Most recent vitals: BP 129/81 (BP Location: Left Arm)   Pulse 83   Temp 98.2 F (36.8 C) (Oral)   Resp 19   Ht 5\' 11"  (1.803 m)   Wt 107.3 kg   SpO2 92%   BMI 32.99 kg/m   Not currently on any BP meds.   A/R: Sinus pause on rhythm strip in a 59 year old admitted for stroke and newly diagnosed with atrial fibrillation.  -- Continue to monitor.  -- Stroke Team to consider a Cardiology consult in the AM.   Electronically signed: Dr. 46

## 2019-11-10 NOTE — TOC Initial Note (Signed)
Transition of Care [facility]) - Initial/Assessment Note    Patient Details  Name: Nicholas Estes MRN: [id number] Date of Birth: 10/04/1960  [facility]) CM/SW Contact:    Kermit Balo, RN Phone Number: 11/10/2019, 3:23 PM  Clinical Narrative:                 Pt is active with [facility] for his health care. PCP: Dr Jess Barters and CSW is [employer].  Pt doesn't have any other form of insurance. Recommendations are for Inpt rehab. [facility]pt rehab [facility]'t accept [facility]. CM spoke with pts wife and they would like to try [facility] In[facility] rehab. CM has spoken to [facility] with [facility] and faxed the patients information .  TOC following.   Expected Discharge Plan: IP Rehab Facility Barriers to Discharge: Continued Medical Work up   Patient Goals and CMS Choice   CMS Medicare.gov Compare Post Acute Care list provided to:: Patient Represenative (must comment) Choice offered to / list presented to : Spouse  Expected Discharge Plan and Services Expected Discharge Plan: IP Rehab Facility   Discharge Planning Services: CM Consult Post Acute Care Choice: IP Rehab Living arrangements for the past 2 months: Single Family Home                                      Prior Living Arrangements/Services Living arrangements for the past 2 months: Single Family Home Lives with:: Spouse Patient language and need for interpreter reviewed:: Yes Do you feel safe going back to the place where you live?: Yes      Need for Family Participation in Patient Care: Yes (Comment) Care giver support system in place?: Yes (comment)   Criminal Activity/Legal Involvement Pertinent to Current Situation/Hospitalization: No - Comment as needed  Activities of Daily Living Home Assistive Devices/Equipment: None ADL Screening (condition at time of admission) Patient's cognitive ability adequate to safely complete daily activities?: Yes Is the patient deaf or have difficulty hearing?: No Does the  patient have difficulty seeing, even when wearing glasses/contacts?: No Does the patient have difficulty concentrating, remembering, or making decisions?: No Patient able to express need for assistance with ADLs?: Yes Does the patient have difficulty dressing or bathing?: No Independently performs ADLs?: Yes (appropriate for developmental age) Does the patient have difficulty walking or climbing stairs?: No Weakness of Legs: None Weakness of Arms/Hands: None  Permission Sought/Granted                  Emotional Assessment Appearance:: Appears stated age     Orientation: : Oriented to Self   Psych Involvement: No (comment)  Admission diagnosis:  Slurred speech [R47.81] Stroke (HCC) [I63.9] Stroke (cerebrum) (HCC) [I63.9] Left-sided weakness [R53.1] Cerebrovascular accident (CVA), unspecified mechanism (HCC) [I63.9] Patient Active Problem List   Diagnosis Date Noted  . Stroke (cerebrum) (HCC) 11/04/2019  . Stroke (HCC) 11/04/2019   PCP:  No primary care provider on file. Pharmacy:  No Pharmacies Listed    Social Determinants of Health (SDOH) Interventions    Readmission Risk Interventions No flowsheet data found.

## 2019-11-10 NOTE — Progress Notes (Signed)
STROKE TEAM PROGRESS NOTE   INTERVAL HISTORY Pt lying in bed, awake alert and mildly lethargic. Still has left hemianopia and right hemiparesis. Pending CIR. Still in afib with a episode of 5 sec pause, will transition to eliquis.   OBJECTIVE Vitals:   11/10/19 0311 11/10/19 0709 11/10/19 0918 11/10/19 1229  BP: 129/81  (!) 143/88 135/85  Pulse: 83 73 81 87  Resp: 19  (!) 24 18  Temp: 98.2 F (36.8 C)  98.2 F (36.8 C) 97.8 F (36.6 C)  TempSrc: Oral  Axillary Oral  SpO2: 92%  95% 92%  Weight:      Height:        CBC:  Recent Labs  Lab 11/04/19 0415 11/04/19 0419 11/08/19 0629 11/09/19 0616  WBC 5.6   < > 8.2 6.2  NEUTROABS 3.5  --   --   --   HGB 16.5   < > 13.8 13.6  HCT 47.9   < > 39.7 39.3  MCV 95.2   < > 91.3 94.5  PLT 231   < > 220 223   < > = values in this interval not displayed.    Basic Metabolic Panel:  Recent Labs  Lab 11/07/19 0434 11/07/19 0434 11/07/19 1640 11/08/19 0629 11/09/19 0616  NA 132*   < >  --  132* 133*  K 3.7   < >  --  3.9 4.0  CL 98   < >  --  98 97*  CO2 24   < >  --  25 27  GLUCOSE 104*   < >  --  134* 107*  BUN 8   < >  --  10 10  CREATININE 0.72   < >  --  0.69 0.76  CALCIUM 8.6*   < >  --  8.7* 8.9  MG 2.0  --  2.0  --   --   PHOS 3.1  --  2.8  --   --    < > = values in this interval not displayed.    Lipid Panel:     Component Value Date/Time   CHOL 128 11/05/2019 0552   TRIG 82 11/05/2019 0552   HDL 37 (L) 11/05/2019 0552   CHOLHDL 3.5 11/05/2019 0552   VLDL 16 11/05/2019 0552   LDLCALC 75 11/05/2019 0552   HgbA1c:  Lab Results  Component Value Date   HGBA1C 6.0 (H) 11/05/2019   Urine Drug Screen:     Component Value Date/Time   LABOPIA NONE DETECTED 11/04/2019 1927   COCAINSCRNUR NONE DETECTED 11/04/2019 1927   LABBENZ NONE DETECTED 11/04/2019 1927   AMPHETMU NONE DETECTED 11/04/2019 1927   THCU NONE DETECTED 11/04/2019 1927   LABBARB NONE DETECTED 11/04/2019 1927    Alcohol Level No results found  for: ETH  IMAGING CT HEAD CODE STROKE WO CONTRAST 11/04/2019 IMPRESSION:  1. Normal brain  2. ASPECTS is 10   CT Code Stroke CTA Head W/WO contrast CT Code Stroke CTA Neck W/WO contrast CT Code Stroke Cerebral Perfusion with contrast 11/04/2019 IMPRESSION:  1. 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 No other intracranial arterial occlusion or high-grade stenosis.  2. 31 mL ischemic penumbra in the right occipital lobe without core infarct.   MRI Brain WO Contrast 11/04/2019 IMPRESSION: 1. Acute/subacute nonhemorrhagic infarct involving the right thalamus, right posterior temporal and occipital lobe, and right para mesencephalic cistern. 2. Decreased reperfusion of the right PCA branch. 3. Mild diffuse sinus disease.  CT Head WO Contrast 11/04/19 IMPRESSION: 1. Small amount of subarachnoid hemorrhage within the right para mesencephalic cistern and sitting just below the tentorium on the right. 2. Evolving posteroinferior PCA territory infarct. 3. The medullary infarct is below the resolution of this study.  MR BRAIN WO CONTRAST 11/05/2019 1. New acute/subacute nonhemorrhagic infarcts involving the posterior right midbrain and high posterior left occipital lobe. 2. The posteromedial right PCA infarct has become more confluent. No hemorrhagic conversion. 3. Stable right posterior medullary infarct. 4. Small focus of subarachnoid hemorrhage in the right para mesencephalic cistern is improving. No new hemorrhage is present.   ECHOCARDIOGRAM COMPLETE 11/05/2019 1. Left ventricular ejection fraction, by estimation, is 55 to 60%. The left ventricle has normal function. The left ventricle has no regional wall motion abnormalities. There is moderate asymmetric left ventricular hypertrophy of the basal-septal segment. Left ventricular diastolic parameters are indeterminate.  2. Right ventricle is poorly visualized but appears grossly normal size and mildly reduced systolic  function. Tricuspid regurgitation signal is inadequate for assessing PA pressure.  3. Left atrial size was mildly dilated.  4. Right atrial size was mildly dilated.  5. The mitral valve is normal in structure. No evidence of mitral valve regurgitation.  6. The aortic valve was not well visualized. Aortic valve regurgitation is not visualized. Mild aortic valve sclerosis is present, with no evidence of aortic valve stenosis.  7. Aortic dilatation noted. 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  8. The inferior vena cava is dilated in size with <50% respiratory variability, suggesting right atrial pressure of 15 mmHg.    Bilateral Lower Extremity Venous Dopplers 11/04/19 No DVT bilaterally  ECG - atrial fibrillation - ventricular response 103 BPM (See cardiology reading for complete details)  MR CERVICAL SPINE WO CONTRAST 11/08/2019 1. Motion degraded examination. 2. Known acute to subacute infarcts involving the right greater than left PCA territories and brainstem including the medulla and upper cervical spinal cord. 3. Cervical spondylosis with severe neural foraminal stenosis at C4-5, C5-6, and C6-7. 4. Mild spinal stenosis at C3-4 and C5-6.     PHYSICAL EXAM   Temp:  [97.7 F (36.5 C)-99.1 F (37.3 C)] 97.8 F (36.6 C) (04/23 1229) Pulse Rate:  [70-91] 87 (04/23 1229) Resp:  [17-24] 18 (04/23 1229) BP: (127-143)/(74-88) 135/85 (04/23 1229) SpO2:  [92 %-96 %] 92 % (04/23 1229) FiO2 (%):  [21 %] 21 % (04/22 2215)  General - Well nourished, well developed, mildly lethargic.  Ophthalmologic - fundi not visualized due to noncooperation.  Cardiovascular - irregularly irregular heart rate and rhythm.  Neuro - awake alert, orientated to place, age and people but not to time. moderate dysarthria, no aphasia. Able to name and repeat. Left hemianopia. Eyes right gaze preference, left gaze incomplete, left visual neglect. Left facial droop. Tongue protrusion midline. LUE  at least 4+/5 and RUE 3/5 proximal and distal. LLE 4+/5 and RLE 3/5. Sensation subjectively symmetrical but complains of RLE tingling "on fire", coordination intact FTN left hand and gait not tested.   ASSESSMENT/PLAN [name] is a 59 y.o. male with history of hypertension presenting with acute onset of dysarthria, left hemiplegia and left facial droop. He did not receive IV t-PA due to late presentation (>4.5 hours from time of onset) IR - Right P2A occlusion. Multiple passes performed (2x solitaire, 3x embotrap, !x aspiration) with minimal recanalization to the level of the P2P segment (TICI2A).   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 ASPECTS is 10   CTA H&N - Occlusion of the right PCA at the proximal P2 segment.   CT Perfusion - positive for penumbra  IR unsuccessful right P2 thrombectomy  MRI head 11/04/19 - bilateral PCA scattered infarcts and right medullary infarcts, small right paramesencephalic cistern SAH  CT 11/04/19 - confirmed small right perimesencephalic cistern SAH  MRI 4/19 new posterior R midbrain and high posterior L occipital lobe infarcts. R PCA infarct w/o hemorrhagic transformation. Small  R paramesencephalic cistern SAH improving.    MRI C-spine R>L PCA and brainstem infarcts, including medulla and upper spinal cord.   2D Echo - EF 55-60%. No source of embolus. LA mildly dilated  LE venous Doppler negative for DVT  Loyal Jacobson Virus 2 - negative  LDL 75  HgbA1c 6.0  UDS - negative  VTE prophylaxis - Heparin 5000 units sq tid   No antithrombotic prior to admission, now on eliquis for stroke prevention. Continue on discharge.    Therapy recommendations:  CIR  Disposition:  Pending  Neuro worsening with left sided weakness - BP dependent  BP low 4/17-> improved after IVF and  albumin  IV bolus 1L  Albumin Q6h x 4  Repeat MRI brain shows new R midbrain infarct and high posterior L occipital lobe infarcts, confluent right PCA infarct extension and right medullary and high cervical cord infarct extension  MRI C-spine R>L PCA and brainstem infarcts, including medulla and upper spinal cord.   BP stable at goal  BP goal 120-160  on midodrine 5mg  tid   Atrial Fibrillation w/ Pauses - new diagnosis  EKG captured afib  Persistent afib on tele with a episode of 5 sec pause 4/22 overnight  Rate controlled at this time  Will consider metoprolol if RVR  Now on eliquis 5 bid  Respiratory distress, improved  4/20 requiring O2 and albuterol nebs.   CXR on 4/17 w/ stable cardiomegaly w/o overt edema.   On chest PT  NT suction PRN  Stable now  Alcohol abuse  Heavy drinker at home as per son  CIWA protocol for another day  Vitamin B1, multivitamin, folic acid  Seizure precaution  Hypotension  Hx of hypertension  Home BP meds: Amlodipine 10  Current BP meds: none  Was low end and now improved to 120s-140s . BP goal 120-160 . On midodrine 5  Tobacco abuse  Current smoker  Smoking cessation counseling provided  Nicotine patch provided  Pt is willing to quit  Dysphagia . Secondary to stroke . Cortrak out by pt 4/22 early am . On dysphagia 3 and thin . encourage and monitor po intake . On gentle hydration . Speech on board   Other Stroke Risk Factors  Obesity, Body mass index is 32.99 kg/m., recommend weight loss, diet and exercise as appropriate   Other Active Problems  Code status - Full code  Hospital day # 6  5/22, MD PhD Stroke Neurology 11/10/2019 1:52 PM   To contact Stroke Continuity provider, please refer to 11/12/2019. After hours, contact General Neurology

## 2019-11-11 MED ORDER — LIDOCAINE 5 % EX PTCH
2.0000 | MEDICATED_PATCH | CUTANEOUS | Status: DC
  Administered 2019-11-11 – 2019-11-14 (×4): 2 via TRANSDERMAL
  Filled 2019-11-11 (×4): qty 2

## 2019-11-11 MED ORDER — SODIUM CHLORIDE 0.9% FLUSH
10.0000 mL | Freq: Two times a day (BID) | INTRAVENOUS | Status: DC
  Administered 2019-11-11 – 2019-11-15 (×9): 10 mL

## 2019-11-11 MED ORDER — SODIUM CHLORIDE 0.9% FLUSH: 10.0000 mL | INTRAVENOUS | Status: DC | PRN

## 2019-11-11 MED ORDER — GABAPENTIN 300 MG PO CAPS
300.0000 mg | ORAL_CAPSULE | Freq: Three times a day (TID) | ORAL | Status: DC
  Administered 2019-11-11 – 2019-11-15 (×12): 300 mg via ORAL
  Filled 2019-11-11 (×12): qty 1

## 2019-11-11 MED ORDER — POLYVINYL ALCOHOL 1.4 % OP SOLN
2.0000 [drp] | OPHTHALMIC | Status: DC | PRN
  Administered 2019-11-12 – 2019-11-14 (×3): 2 [drp] via OPHTHALMIC
  Filled 2019-11-11: qty 15

## 2019-11-11 NOTE — Progress Notes (Signed)
Physical Therapy Treatment Patient Details Name: Nicholas Estes MRN: [id number] DOB: [birth year] Today's Date: 11/11/2019    History of Present Illness  59 y.o. male presenting from home with acute onset of dysarthria, left hemiplegia and left facial droop. Delayed coming to ED therefore no tPA. NIHSS 14 Interventional radiology for right PCA occlusion with minimal recanalization completed. 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     PT Comments    Patient is progressing towards physical therapy goals, tolerating max A +2 transfer today using stedy. Practiced postural control and midline awareness with Lt visual tracking. Impaired coordination with reaching but able to grasp handle of stedy intermittently. Patient will continue to benefit from skilled physical therapy services to further improve independence with functional mobility.     Follow Up Recommendations  CIR     Equipment Recommendations  Hospital bed;Wheelchair cushion (measurements PT);Wheelchair (measurements PT)    Recommendations for Other Services Rehab consult     Precautions / Restrictions Precautions Precautions: Fall Precaution Comments: bilat hemiparesis Restrictions Weight Bearing Restrictions: No    Mobility  Bed Mobility Overal bed mobility: Needs Assistance Bed Mobility: Supine to Sit     Supine to sit: Max assist;+2 for physical assistance;+2 for safety/equipment     General bed mobility comments: pt attempting to progress LLE to EOB, assist for RLE management, attempted to grasp bed rail with LUE, no functional use  of RUE, maxA+2 to progress trunk to upright position  Transfers Overall transfer level: Needs assistance   Transfers: Sit to/from Stand Sit to Stand: +2 physical assistance;Max assist         General transfer comment: sit<>stand maxA+2 into stedy, and again from stedy into recliner.  Ambulation/Gait                 Stairs             Wheelchair Mobility    Modified Rankin (Stroke Patients Only) Modified Rankin (Stroke Patients Only) Pre-Morbid Rankin Score: No symptoms Modified Rankin: Severe disability     Balance Overall balance assessment: Needs assistance Sitting-balance support: Feet supported;Single extremity supported Sitting balance-Leahy Scale: Poor Sitting balance - Comments: modA for support in sitting EOB, pt needing support to prevent loss of balance and reduce posterior lean, max cues to correct posture, pt would fall with gravity in any direction   Standing balance support: Bilateral upper extremity supported Standing balance-Leahy Scale: Zero Standing balance comment: unable to progress into full upright posture                            Cognition Arousal/Alertness: Awake/alert Behavior During Therapy: WFL for tasks assessed/performed Overall Cognitive Status: Impaired/Different from baseline Area of Impairment: Attention;Safety/judgement                   Current Attention Level: Sustained   Following Commands: Follows one step commands consistently;Follows one step commands with increased time Safety/Judgement: Decreased awareness of deficits;Decreased awareness of safety   Problem Solving: Decreased initiation;Requires verbal cues;Requires tactile cues General Comments: decreased awareness of deficits, decreased body awareness, left inattention;slow processing,  Exercises      General Comments General comments (skin integrity, edema, etc.): Maxi lift in chair for nursing transfer. RN notified       Pertinent Vitals/Pain Pain Assessment: Faces Faces Pain Scale: Hurts whole lot Pain Location: generalized, majority in Right hip Pain Descriptors / Indicators: Tightness;Aching;Sharp Pain Intervention(s): Monitored during session;Premedicated before session;Repositioned    Home Living                      Prior Function            PT Goals (current goals can now be found in the care plan section) Acute Rehab PT Goals Patient Stated Goal: regain function PT Goal Formulation: With family Time For Goal Achievement: 11/20/19 Potential to Achieve Goals: Fair Progress towards PT goals: PT to reassess next treatment    Frequency    Min 4X/week      PT Plan Current plan remains appropriate    Co-evaluation   Reason for Co-Treatment: Complexity of the patient's impairments (multi-system involvement);For patient/therapist safety;To address functional/ADL transfers   OT goals addressed during session: ADL's and self-care      AM-PAC PT "6 Clicks" Mobility   Outcome Measure  Help needed turning from your back to your side while in a flat bed without using bedrails?: Total Help needed moving from lying on your back to sitting on the side of a flat bed without using bedrails?: Total Help needed moving to and from a bed to a chair (including a wheelchair)?: Total Help needed standing up from a chair using your arms (e.g., wheelchair or bedside chair)?: Total Help needed to walk in hospital room?: Total Help needed climbing 3-5 steps with a railing? : Total 6 Click Score: 6    End of Session   Activity Tolerance: Patient limited by fatigue;Patient limited by pain Patient left: in chair;with call bell/phone within reach;with chair alarm set;with SCD's reapplied Nurse Communication: Mobility status;Need for lift equipment PT Visit Diagnosis: Hemiplegia and hemiparesis;Pain Hemiplegia - Right/Left: Right Hemiplegia - dominant/non-dominant: Dominant Hemiplegia -  caused by: Cerebral infarction Pain - Right/Left: Right Pain - part of body: Hip     Time: 0923-3007 PT Time Calculation (min) (ACUTE ONLY): 35 min  Charges:  $Therapeutic Activity: 8-22 mins                     [employer], PT     Berton Mount 11/11/2019, 4:14 PM

## 2019-11-11 NOTE — Progress Notes (Signed)
0.5 mg of Ativan given via IV to patient just prior to MRI for anxiety and agitation.   1.5mg  of IV Ativan wasted in trash with witness Celene Squibb, RN.

## 2019-11-11 NOTE — Progress Notes (Signed)
STROKE TEAM PROGRESS NOTE   INTERVAL HISTORY Pt lying in bed, awake alert and mildly lethargic. Still has left hemianopia and right hemiparesis. Pending CIR. Still in afib with a episode of 5 sec pause, will transition to eliquis. He has meralgia paresthetica, likely from being in bed for so long, gave gabapentin prn and lidoderm patches. Also eye drops for dry eyes.  OBJECTIVE Vitals:   11/10/19 2339 11/11/19 0447 11/11/19 0821 11/11/19 1141  BP: (!) 154/75 (!) 127/91 (!) 154/93 130/87  Pulse: 83 76 72 75  Resp: 18 19 18 17   Temp: 98.9 F (37.2 C) 97.8 F (36.6 C) 98.4 F (36.9 C) 98.5 F (36.9 C)  TempSrc:  Oral Axillary Oral  SpO2: 95% 91% 96% 94%  Weight:      Height:        CBC:  Recent Labs  Lab 11/08/19 0629 11/09/19 0616  WBC 8.2 6.2  HGB 13.8 13.6  HCT 39.7 39.3  MCV 91.3 94.5  PLT 220 223    Basic Metabolic Panel:  Recent Labs  Lab 11/07/19 0434 11/07/19 0434 11/07/19 1640 11/08/19 0629 11/09/19 0616  NA 132*   < >  --  132* 133*  K 3.7   < >  --  3.9 4.0  CL 98   < >  --  98 97*  CO2 24   < >  --  25 27  GLUCOSE 104*   < >  --  134* 107*  BUN 8   < >  --  10 10  CREATININE 0.72   < >  --  0.69 0.76  CALCIUM 8.6*   < >  --  8.7* 8.9  MG 2.0  --  2.0  --   --   PHOS 3.1  --  2.8  --   --    < > = values in this interval not displayed.    Lipid Panel:     Component Value Date/Time   CHOL 128 11/05/2019 0552   TRIG 82 11/05/2019 0552   HDL 37 (L) 11/05/2019 0552   CHOLHDL 3.5 11/05/2019 0552   VLDL 16 11/05/2019 0552   LDLCALC 75 11/05/2019 0552   HgbA1c:  Lab Results  Component Value Date   HGBA1C 6.0 (H) 11/05/2019   Urine Drug Screen:     Component Value Date/Time   LABOPIA NONE DETECTED 11/04/2019 1927   COCAINSCRNUR NONE DETECTED 11/04/2019 1927   LABBENZ NONE DETECTED 11/04/2019 1927   AMPHETMU NONE DETECTED 11/04/2019 1927   THCU NONE DETECTED 11/04/2019 1927   LABBARB NONE DETECTED 11/04/2019 1927    Alcohol Level No  results found for: ETH  IMAGING CT HEAD CODE STROKE WO CONTRAST 11/04/2019 IMPRESSION:  1. Normal brain  2. ASPECTS is 10   CT Code Stroke CTA Head W/WO contrast CT Code Stroke CTA Neck W/WO contrast CT Code Stroke Cerebral Perfusion with contrast 11/04/2019 IMPRESSION:  1. 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 No other intracranial arterial occlusion or high-grade stenosis.  2. 31 mL ischemic penumbra in the right occipital lobe without core infarct.   MRI Brain WO Contrast 11/04/2019 IMPRESSION: 1. Acute/subacute nonhemorrhagic infarct involving the right thalamus, right posterior temporal and occipital lobe, and right para mesencephalic cistern. 2. Decreased reperfusion of the right PCA branch. 3. Mild diffuse sinus disease.  CT Head WO Contrast 11/04/19 IMPRESSION: 1. Small amount of subarachnoid hemorrhage within the right para mesencephalic cistern and sitting just below the tentorium  on the right. 2. Evolving posteroinferior PCA territory infarct. 3. The medullary infarct is below the resolution of this study.  MR BRAIN WO CONTRAST 11/05/2019 1. New acute/subacute nonhemorrhagic infarcts involving the posterior right midbrain and high posterior left occipital lobe. 2. The posteromedial right PCA infarct has become more confluent. No hemorrhagic conversion. 3. Stable right posterior medullary infarct. 4. Small focus of subarachnoid hemorrhage in the right para mesencephalic cistern is improving. No new hemorrhage is present.   ECHOCARDIOGRAM COMPLETE 11/05/2019 1. Left ventricular ejection fraction, by estimation, is 55 to 60%. The left ventricle has normal function. The left ventricle has no regional wall motion abnormalities. There is moderate asymmetric left ventricular hypertrophy of the basal-septal segment. Left ventricular diastolic parameters are indeterminate.  2. Right ventricle is poorly visualized but appears grossly normal size and mildly reduced  systolic function. Tricuspid regurgitation signal is inadequate for assessing PA pressure.  3. Left atrial size was mildly dilated.  4. Right atrial size was mildly dilated.  5. The mitral valve is normal in structure. No evidence of mitral valve regurgitation.  6. The aortic valve was not well visualized. Aortic valve regurgitation is not visualized. Mild aortic valve sclerosis is present, with no evidence of aortic valve stenosis.  7. Aortic dilatation noted. 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  8. The inferior vena cava is dilated in size with <50% respiratory variability, suggesting right atrial pressure of 15 mmHg.    Bilateral Lower Extremity Venous Dopplers 11/04/19 No DVT bilaterally  ECG - atrial fibrillation - ventricular response 103 BPM (See cardiology reading for complete details)  MR CERVICAL SPINE WO CONTRAST 11/08/2019 1. Motion degraded examination. 2. Known acute to subacute infarcts involving the right greater than left PCA territories and brainstem including the medulla and upper cervical spinal cord. 3. Cervical spondylosis with severe neural foraminal stenosis at C4-5, C5-6, and C6-7. 4. Mild spinal stenosis at C3-4 and C5-6.     PHYSICAL EXAM   Temp:  [97.7 F (36.5 C)-98.9 F (37.2 C)] 98.5 F (36.9 C) (04/24 1141) Pulse Rate:  [72-110] 75 (04/24 1141) Resp:  [17-19] 17 (04/24 1141) BP: (127-154)/(75-93) 130/87 (04/24 1141) SpO2:  [91 %-96 %] 94 % (04/24 1141)  General - Well nourished, well developed, mildly lethargic.  Ophthalmologic - fundi not visualized due to noncooperation.  Cardiovascular - irregularly irregular heart rate and rhythm.  Neuro - awake alert, orientated to place, age and people but not to time. moderate dysarthria, no aphasia. Able to name and repeat. Left hemianopia. Eyes right gaze preference, left gaze incomplete, left visual neglect. Left facial droop. Tongue protrusion midline. LUE at least 4+/5 and RUE 3/5  proximal and distal. LLE 4+/5 and RLE 3/5. Sensation subjectively symmetrical but complains of RLE tingling "on fire", coordination intact FTN left hand and gait not tested.   ASSESSMENT/PLAN Mr. Della Homan is a 59 y.o. male with history of hypertension presenting with acute onset of dysarthria, left hemiplegia and left facial droop. He did not receive IV t-PA due to late presentation (>4.5 hours from time of onset) IR - Right P2A occlusion. Multiple passes performed (2x solitaire, 3x embotrap, !x aspiration) with minimal recanalization to the level of the P2P segment (TICI2A).   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 ASPECTS is 10   CTA H&N - Occlusion of the  right PCA at the proximal P2 segment.   CT Perfusion - positive for penumbra  IR unsuccessful right P2 thrombectomy  MRI head 11/04/19 - bilateral PCA scattered infarcts and right medullary infarcts, small right paramesencephalic cistern SAH  CT 11/04/19 - confirmed small right perimesencephalic cistern SAH  MRI 4/19 new posterior R midbrain and high posterior L occipital lobe infarcts. R PCA infarct w/o hemorrhagic transformation. Small  R paramesencephalic cistern SAH improving.    MRI C-spine R>L PCA and brainstem infarcts, including medulla and upper spinal cord.   2D Echo - EF 55-60%. No source of embolus. LA mildly dilated  LE venous Doppler negative for DVT  Loyal Jacobson Virus 2 - negative  LDL 75  HgbA1c 6.0  UDS - negative  VTE prophylaxis - Heparin 5000 units sq tid   No antithrombotic prior to admission, now on eliquis for stroke prevention. Continue on discharge.    Therapy recommendations:  CIR  Disposition:  Pending  Neuro worsening with left sided weakness - BP dependent  BP low 4/17-> improved after IVF and albumin  IV bolus 1L  Albumin  Q6h x 4  Repeat MRI brain shows new R midbrain infarct and high posterior L occipital lobe infarcts, confluent right PCA infarct extension and right medullary and high cervical cord infarct extension  MRI C-spine R>L PCA and brainstem infarcts, including medulla and upper spinal cord.   BP stable at goal  BP goal 120-160  on midodrine 5mg  tid   Atrial Fibrillation w/ Pauses - new diagnosis  EKG captured afib  Persistent afib on tele with a episode of 5 sec pause 4/22 overnight  Rate controlled at this time  Will consider metoprolol if RVR  Now on eliquis 5 bid  Respiratory distress, improved  4/20 requiring O2 and albuterol nebs.   CXR on 4/17 w/ stable cardiomegaly w/o overt edema.   On chest PT  NT suction PRN  Stable now  Alcohol abuse  Heavy drinker at home as per son  CIWA protocol for another day  Vitamin B1, multivitamin, folic acid  Seizure precaution  Hypotension  Hx of hypertension  Home BP meds: Amlodipine 10  Current BP meds: none  Was low end and now improved to 120s-140s . BP goal 120-160 . On midodrine 5  Tobacco abuse  Current smoker  Smoking cessation counseling provided  Nicotine patch provided  Pt is willing to quit  Dysphagia . Secondary to stroke . Cortrak out by pt 4/22 early am . On dysphagia 3 and thin . encourage and monitor po intake . On gentle hydration . Speech on board   Other Stroke Risk Factors  Obesity, Body mass index is 32.99 kg/m., recommend weight loss, diet and exercise as appropriate   Other Active Problems  Code status - Full code  Meralgia paresthetica - gabapentin and lidoderm patches prn  Dry eyes - saline drops  Hospital day # 7    To contact Stroke Continuity provider, please refer to 5/22. After hours, contact General Neurology  Personally examined patient and images, and have participated in and made any corrections needed to history, physical, neuro exam,assessment and  plan as stated above.  I have personally obtained the history, evaluated lab date, reviewed imaging studies and agree with radiology interpretations.    [email or web address], MD Stroke Neurology   I spent 25 minutes of face-to-face and non-face-to-face time with patient and his wife.  This included previsit chart review, lab review, study review, order entry, electronic health  record documentation, patient education on the different diagnostic and therapeutic options, counseling and coordination of care, risks and benefits of management, compliance, or risk factor reduction

## 2019-11-11 NOTE — Progress Notes (Addendum)
Occupational Therapy Treatment Patient Details Name: Nicholas Estes MRN: [id number] DOB: 08/31/1960 Today's Date: 11/11/2019    History of present illness  59 y.o. male presenting from home with acute onset of dysarthria, left hemiplegia and left facial droop. Delayed coming to ED therefore no tPA. NIHSS 14 Interventional radiology for right PCA occlusion with minimal recanalization completed. 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    OT comments  Pt received in bed, highly motivated to transfer to the recliner. He required maxA+2 for bed mobility, modA for support sitting EOB for about with multimodal cues for correcting posture. He required minA for drinking from cup.  Pt required heavy maxA+2 with use of stedy to transfer to recliner. Pt was very appreciative to be sitting in recliner. Pt left in recliner with lift pad underneath him and all extremities and trunk supported by pillows. Pt will continue to benefit from skilled OT services to maximize safety and independence with ADL/IADL and functional mobility. Will continue to follow acutely and progress as tolerated.    Follow Up Recommendations  CIR;Supervision/Assistance - 24 hour    Equipment Recommendations  Other (comment)(Defer to next venue)    Recommendations for Other Services PT consult    Precautions / Restrictions Precautions Precautions: Fall Precaution Comments: bilat hemiparesis Restrictions Weight Bearing Restrictions: No       Mobility Bed Mobility Overal bed mobility: Needs Assistance Bed Mobility: Supine to Sit      Supine to sit: Max assist;+2 for physical assistance;+2 for safety/equipment     General bed mobility comments: pt attempting to progress LLE to EOB, assist for RLE management, attempted to grasp bed rail with LUE, no functional use of RUE, maxA+2 to progress trunk to upright position  Transfers Overall transfer level: Needs assistance   Transfers: Sit to/from Stand Sit to Stand: +2 physical assistance;Max assist         General transfer comment: sit<>stand maxA+2     Balance Overall balance assessment: Needs assistance Sitting-balance support: Feet supported;Single extremity supported Sitting balance-Leahy Scale: Poor Sitting balance - Comments: modA for support in sitting EOB, pt needing support to prevent loss of balance and reduce posterior lean, max cues to correct posture, pt would fall with gravity in any direction   Standing balance support: Bilateral upper extremity supported Standing balance-Leahy Scale: Zero Standing balance comment: unable to progress into full upright posture                           ADL either performed or assessed with clinical judgement   ADL Overall ADL's : Needs assistance/impaired Eating/Feeding: Moderate assistance;Sitting Eating/Feeding Details (indicate cue type and reason): full supervision per SLP  Grooming: Maximal assistance                   Toilet Transfer: Maximal assistance;+2 for physical assistance;+2 for safety/equipment Toilet Transfer Details (indicate cue type and reason): used stedy to transfer pt from EOB to recliner           General ADL Comments: maxA for majority of ADL, pt limited by decreased cognition, visual limitations, decreased  strength and use of BUE     Vision   Vision Assessment?: Yes Alignment/Gaze Preference: Head turned;Gaze right Tracking/Visual Pursuits: Decreased smoothness of horizontal tracking Visual Fields: Left visual field deficit Depth Perception:  Undershoots Additional Comments: pt with Right gaze preference, requires cues to track into left field of view, unable to locate drink just left outside of pt's midline   Perception     Praxis      Cognition Arousal/Alertness: Awake/Estes Behavior During Therapy: WFL for tasks assessed/performed Overall Cognitive Status: Impaired/Different from baseline Area of Impairment: Attention;Safety/judgement                   Current Attention Level: Sustained   Following Commands: Follows one step commands consistently;Follows one step commands with increased time Safety/Judgement: Decreased awareness of deficits;Decreased awareness of safety   Problem Solving: Decreased initiation;Requires verbal cues;Requires tactile cues General Comments: decreased awareness of deficits, decreased body awareness, left inattention;slow processing,         Exercises     Shoulder Instructions       General Comments      Pertinent Vitals/ Pain       Pain Assessment: Faces Faces Pain Scale: Hurts whole lot Pain Location: generalized, majority in Right hip Pain Descriptors / Indicators: Tightness;Aching;Sharp Pain Intervention(s): Monitored during session;Limited activity within patient's tolerance;Premedicated before session  Home Living                                          Prior Functioning/Environment              Frequency  Min 2X/week        Progress Toward Goals  OT Goals(current goals can now be found in the care plan section)  Progress towards OT goals: Progressing toward goals  Acute Rehab OT Goals Patient Stated Goal: regain function OT Goal Formulation: With patient Time For Goal Achievement: 11/20/19 Potential to Achieve Goals: Good ADL Goals Pt Will Perform Grooming: with min assist;sitting Pt Will Perform Upper Body Dressing: with min assist;sitting Pt Will Transfer to Toilet: with mod assist;stand pivot transfer;bedside commode Pt  Will Perform Toileting - Clothing Manipulation and hygiene: with mod assist;sit to/from stand;sitting/lateral leans Additional ADL Goal #1: Pt will perform bed mobility with Min A in preparation for ADLs Additional ADL Goal #2: Pt will tolerate sitting at EOB with Min Guard A fpr ~10 minutes in preparation for ADLs  Plan Discharge plan remains appropriate    Co-evaluation    PT/OT/SLP Co-Evaluation/Treatment: Yes Reason for Co-Treatment: For patient/therapist safety;To address functional/ADL transfers;Complexity of the patient's impairments (multi-system involvement)   OT goals addressed during session: ADL's and self-care      AM-PAC OT "6 Clicks" Daily Activity     Outcome Measure   Help from another person eating meals?: Total Help from another person taking care of personal grooming?: A Lot Help from another person toileting, which includes using toliet, bedpan, or urinal?: A Lot Help from another person bathing (including washing, rinsing, drying)?: A Lot Help from another person to put on and taking off regular upper body clothing?: A Lot Help from another person to put on and taking off regular lower body clothing?: A Lot 6 Click Score: 11    End of Session Equipment Utilized During Treatment: Gait belt  OT Visit Diagnosis: Unsteadiness on feet (R26.81);Other abnormalities of gait and mobility (R26.89);Muscle weakness (generalized) (  M62.81);Pain Pain - Right/Left: Right Pain - part of body: Hip(Generalized)   Activity Tolerance Patient limited by lethargy;Patient tolerated treatment well   Patient Left with call bell/phone within reach;in chair;with chair alarm set;Other (comment)(with lift pad underneath pt)   Nurse Communication Mobility status;Other (comment)(SpO2 on RA)        Time: 8110-3159 OT Time Calculation (min): 36 min  Charges: OT General Charges $OT Visit: 1 Visit OT Treatments $Self Care/Home Management : 8-22 mins  Rosey Bath OTR/L Acute  Rehabilitation Services Office: [phone]    Nicholas Estes 11/11/2019, 3:56 PM

## 2019-11-12 ENCOUNTER — Encounter (HOSPITAL_COMMUNITY): Payer: Self-pay | Admitting: Neurology

## 2019-11-12 NOTE — Progress Notes (Signed)
STROKE TEAM PROGRESS NOTE   INTERVAL HISTORY Pt lying in bed, awake alert and still c/o burning in the thigh nd dry eyes. I spoke to his nurse gain about eye drops.  Still has left hemianopia and right hemiparesis. Pending CIR. Still in afib with a episode of 5 sec pause, will transition to eliquis. He has meralgia paresthetica, likely from being in bed for so long, gave gabapentin prn and lidoderm patches. Also eye drops for dry eyes.  OBJECTIVE Vitals:   11/12/19 0037 11/12/19 0359 11/12/19 0830 11/12/19 1152  BP: (!) 144/83 (!) 125/94 (!) 145/94 (!) 164/86  Pulse: 77 84 88 89  Resp: 18 18 17 20   Temp: 98 F (36.7 C) 98.1 F (36.7 C) 98.5 F (36.9 C) 98.2 F (36.8 C)  TempSrc: Oral Oral Oral Oral  SpO2: 98% 95% 94% 96%  Weight:      Height:        CBC:  Recent Labs  Lab 11/08/19 0629 11/09/19 0616  WBC 8.2 6.2  HGB 13.8 13.6  HCT 39.7 39.3  MCV 91.3 94.5  PLT 220 223    Basic Metabolic Panel:  Recent Labs  Lab 11/07/19 0434 11/07/19 0434 11/07/19 1640 11/08/19 0629 11/09/19 0616  NA 132*   < >  --  132* 133*  K 3.7   < >  --  3.9 4.0  CL 98   < >  --  98 97*  CO2 24   < >  --  25 27  GLUCOSE 104*   < >  --  134* 107*  BUN 8   < >  --  10 10  CREATININE 0.72   < >  --  0.69 0.76  CALCIUM 8.6*   < >  --  8.7* 8.9  MG 2.0  --  2.0  --   --   PHOS 3.1  --  2.8  --   --    < > = values in this interval not displayed.    Lipid Panel:     Component Value Date/Time   CHOL 128 11/05/2019 0552   TRIG 82 11/05/2019 0552   HDL 37 (L) 11/05/2019 0552   CHOLHDL 3.5 11/05/2019 0552   VLDL 16 11/05/2019 0552   LDLCALC 75 11/05/2019 0552   HgbA1c:  Lab Results  Component Value Date   HGBA1C 6.0 (H) 11/05/2019   Urine Drug Screen:     Component Value Date/Time   LABOPIA NONE DETECTED 11/04/2019 1927   COCAINSCRNUR NONE DETECTED 11/04/2019 1927   LABBENZ NONE DETECTED 11/04/2019 1927   AMPHETMU NONE DETECTED 11/04/2019 1927   THCU NONE DETECTED 11/04/2019  1927   LABBARB NONE DETECTED 11/04/2019 1927    Alcohol Level No results found for: ETH  IMAGING CT HEAD CODE STROKE WO CONTRAST 11/04/2019 IMPRESSION:  1. Normal brain  2. ASPECTS is 10   CT Code Stroke CTA Head W/WO contrast CT Code Stroke CTA Neck W/WO contrast CT Code Stroke Cerebral Perfusion with contrast 11/04/2019 IMPRESSION:  1. 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 No other intracranial arterial occlusion or high-grade stenosis.  2. 31 mL ischemic penumbra in the right occipital lobe without core infarct.   MRI Brain WO Contrast 11/04/2019 IMPRESSION: 1. Acute/subacute nonhemorrhagic infarct involving the right thalamus, right posterior temporal and occipital lobe, and right para mesencephalic cistern. 2. Decreased reperfusion of the right PCA branch. 3. Mild diffuse sinus disease.  CT Head WO Contrast 11/04/19 IMPRESSION:  1. Small amount of subarachnoid hemorrhage within the right para mesencephalic cistern and sitting just below the tentorium on the right. 2. Evolving posteroinferior PCA territory infarct. 3. The medullary infarct is below the resolution of this study.  MR BRAIN WO CONTRAST 11/05/2019 1. New acute/subacute nonhemorrhagic infarcts involving the posterior right midbrain and high posterior left occipital lobe. 2. The posteromedial right PCA infarct has become more confluent. No hemorrhagic conversion. 3. Stable right posterior medullary infarct. 4. Small focus of subarachnoid hemorrhage in the right para mesencephalic cistern is improving. No new hemorrhage is present.   ECHOCARDIOGRAM COMPLETE 11/05/2019 1. Left ventricular ejection fraction, by estimation, is 55 to 60%. The left ventricle has normal function. The left ventricle has no regional wall motion abnormalities. There is moderate asymmetric left ventricular hypertrophy of the basal-septal segment. Left ventricular diastolic parameters are indeterminate.  2. Right ventricle is  poorly visualized but appears grossly normal size and mildly reduced systolic function. Tricuspid regurgitation signal is inadequate for assessing PA pressure.  3. Left atrial size was mildly dilated.  4. Right atrial size was mildly dilated.  5. The mitral valve is normal in structure. No evidence of mitral valve regurgitation.  6. The aortic valve was not well visualized. Aortic valve regurgitation is not visualized. Mild aortic valve sclerosis is present, with no evidence of aortic valve stenosis.  7. Aortic dilatation noted. 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  8. The inferior vena cava is dilated in size with <50% respiratory variability, suggesting right atrial pressure of 15 mmHg.    Bilateral Lower Extremity Venous Dopplers 11/04/19 No DVT bilaterally  ECG - atrial fibrillation - ventricular response 103 BPM (See cardiology reading for complete details)  MR CERVICAL SPINE WO CONTRAST 11/08/2019 1. Motion degraded examination. 2. Known acute to subacute infarcts involving the right greater than left PCA territories and brainstem including the medulla and upper cervical spinal cord. 3. Cervical spondylosis with severe neural foraminal stenosis at C4-5, C5-6, and C6-7. 4. Mild spinal stenosis at C3-4 and C5-6.     PHYSICAL EXAM   Temp:  [97.7 F (36.5 C)-98.5 F (36.9 C)] 98.2 F (36.8 C) (04/25 1152) Pulse Rate:  [69-89] 89 (04/25 1152) Resp:  [17-20] 20 (04/25 1152) BP: (125-164)/(80-94) 164/86 (04/25 1152) SpO2:  [94 %-99 %] 96 % (04/25 1152)  Stable:  General - Well nourished, well developed, mildly lethargic.  Ophthalmologic - fundi not visualized due to noncooperation.  Cardiovascular - irregularly irregular heart rate and rhythm.  Neuro - awake alert, orientated. Mild dysarthria but understandable, no aphasia. Able to name and repeat. Left hemianopia. Eyes right gaze preference but can cross the midline and track examiner, , left visual neglect  improved. Left facial droop. Tongue protrusion midline. LUE at least 4+/5 and RUE 3/5 proximal and distal. LLE 4+/5 and RLE 3/5. Sensation subjectively symmetrical but complains of RLE tingling "on fire" at the thigh (in no acute distress), coordination intact FTN left hand and gait not tested.   ASSESSMENT/PLAN Mr. Sarath Privott is a 59 y.o. male with history of hypertension presenting with acute onset of dysarthria, left hemiplegia and left facial droop. He did not receive IV t-PA due to late presentation (>4.5 hours from time of onset) IR - Right P2A occlusion. Multiple passes performed (2x solitaire, 3x embotrap, !x aspiration) with minimal recanalization to the level of the P2P segment (TICI2A). Stable awaiting CIR.  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 ASPECTS is 10   CTA H&N - Occlusion of the right PCA at the proximal P2 segment.   CT Perfusion - positive for penumbra  IR unsuccessful right P2 thrombectomy  MRI head 11/04/19 - bilateral PCA scattered infarcts and right medullary infarcts, small right paramesencephalic cistern SAH  CT 8/54/62 - confirmed small right perimesencephalic cistern SAH  MRI 4/19 new posterior R midbrain and high posterior L occipital lobe infarcts. R PCA infarct w/o hemorrhagic transformation. Small  R paramesencephalic cistern SAH improving.    MRI C-spine R>L PCA and brainstem infarcts, including medulla and upper spinal cord.   2D Echo - EF 55-60%. No source of embolus. LA mildly dilated  LE venous Doppler negative for DVT  Lacey Jensen Virus 2 - negative  LDL 75  HgbA1c 6.0  UDS - negative  VTE prophylaxis - Heparin 5000 units sq tid   No antithrombotic prior to admission, now on eliquis for stroke prevention. Continue on discharge.    Therapy recommendations:  CIR  Disposition:   Pending  Neuro worsening with left sided weakness - BP dependent  BP low 4/17-> improved after IVF and albumin  IV bolus 1L  Albumin Q6h x 4  Repeat MRI brain shows new R midbrain infarct and high posterior L occipital lobe infarcts, confluent right PCA infarct extension and right medullary and high cervical cord infarct extension  MRI C-spine R>L PCA and brainstem infarcts, including medulla and upper spinal cord.   BP stable at goal  BP goal 120-160  on midodrine 5mg  tid   Atrial Fibrillation w/ Pauses - new diagnosis  EKG captured afib  Persistent afib on tele with a episode of 5 sec pause 4/22 overnight  Rate controlled at this time  Will consider metoprolol if RVR  Now on eliquis 5 bid  Respiratory distress, improved  4/20 requiring O2 and albuterol nebs.   CXR on 4/17 w/ stable cardiomegaly w/o overt edema.   On chest PT  NT suction PRN  Stable now  Alcohol abuse  Heavy drinker at home as per son  CIWA protocol for another day  Vitamin B1, multivitamin, folic acid  Seizure precaution  Hypotension  Hx of hypertension  Home BP meds: Amlodipine 10  Current BP meds: none  Was low end and now improved to 120s-140s . BP goal 120-160 . On midodrine 5  Tobacco abuse  Current smoker  Smoking cessation counseling provided  Nicotine patch provided  Pt is willing to quit  Dysphagia . Secondary to stroke . Cortrak out by pt 4/22 early am . On dysphagia 3 and thin . encourage and monitor po intake . On gentle hydration . Speech on board   Other Stroke Risk Factors  Obesity, Body mass index is 32.99 kg/m., recommend weight loss, diet and exercise as appropriate   Other Active Problems  Code status - Full code  Meralgia paresthetica - gabapentin and lidoderm patches prn  Dry eyes - saline drops  Hospital day # 8    To contact Stroke Continuity provider, please refer to [email or web address] After hours, contact General  Neurology  Personally examined patient and images, and have participated in and made any corrections needed to history, physical, neuro exam,assessment and plan as stated above.  I have personally obtained the history, evaluated lab date, reviewed imaging studies and agree with radiology interpretations.    I spent 15 minutes of face-to-face  and non-face-to-face time with patient and his wife at bedside. This included previsit chart review, lab review, study review, order entry, electronic health record documentation, patient education on the different diagnostic and therapeutic options, counseling and coordination of care, risks and benefits of management, compliance, or risk factor reduction

## 2019-11-12 NOTE — Discharge Instructions (Signed)

## 2019-11-12 NOTE — Plan of Care (Signed)
  Problem: Education: Goal: Knowledge of General Education information will improve Description: Including pain rating scale, medication(s)/side effects and non-pharmacologic comfort measures Outcome: Progressing   Problem: Health Behavior/Discharge Planning: Goal: Ability to manage health-related needs will improve Outcome: Progressing   Problem: Clinical Measurements: Goal: Ability to maintain clinical measurements within normal limits will improve Outcome: Progressing Goal: Will remain free from infection Outcome: Progressing Goal: Diagnostic test results will improve Outcome: Progressing Goal: Respiratory complications will improve Outcome: Progressing Goal: Cardiovascular complication will be avoided Outcome: Progressing   Problem: Activity: Goal: Risk for activity intolerance will decrease Outcome: Progressing   Problem: Nutrition: Goal: Adequate nutrition will be maintained Outcome: Progressing   Problem: Coping: Goal: Level of anxiety will decrease Outcome: Progressing   Problem: Elimination: Goal: Will not experience complications related to bowel motility Outcome: Progressing Goal: Will not experience complications related to urinary retention Outcome: Progressing   Problem: Pain Managment: Goal: General experience of comfort will improve Outcome: Progressing   Problem: Safety: Goal: Ability to remain free from injury will improve Outcome: Progressing   Problem: Skin Integrity: Goal: Risk for impaired skin integrity will decrease Outcome: Progressing   Problem: Education: Goal: Knowledge of disease or condition will improve Outcome: Progressing Goal: Knowledge of secondary prevention will improve Outcome: Progressing Goal: Knowledge of patient specific risk factors addressed and post discharge goals established will improve Outcome: Progressing Goal: Individualized Educational Video(s) Outcome: Progressing   Problem: Coping: Goal: Will verbalize  positive feelings about self Outcome: Progressing   Problem: Self-Care: Goal: Ability to participate in self-care as condition permits will improve Outcome: Progressing   Problem: Ischemic Stroke/TIA Tissue Perfusion: Goal: Complications of ischemic stroke/TIA will be minimized Outcome: Progressing   

## 2019-11-13 DIAGNOSIS — I63433 Cerebral infarction due to embolism of bilateral posterior cerebral arteries: Secondary | ICD-10-CM

## 2019-11-13 NOTE — Progress Notes (Signed)
STROKE TEAM PROGRESS NOTE   INTERVAL HISTORY Patient is sitting on the side of the bed.  He is getting physical therapy.  Still has persistent left-sided visual field loss.  No neurological changes.  Vital signs stable.  Is tolerating Eliquis  OBJECTIVE Vitals:   11/12/19 1934 11/12/19 2312 11/13/19 0401 11/13/19 0856  BP: 121/71 136/80 (!) 134/96 (!) 143/94  Pulse: 93 93 86 79  Resp: 18 18 18 20   Temp: 98.2 F (36.8 C) 97.8 F (36.6 C) 98 F (36.7 C) 97.8 F (36.6 C)  TempSrc: Oral Oral Oral Oral  SpO2: 92% 99% 100% 94%  Weight:      Height:        CBC:  Recent Labs  Lab 11/08/19 0629 11/09/19 0616  WBC 8.2 6.2  HGB 13.8 13.6  HCT 39.7 39.3  MCV 91.3 94.5  PLT 220 756    Basic Metabolic Panel:  Recent Labs  Lab 11/07/19 0434 11/07/19 0434 11/07/19 1640 11/08/19 0629 11/09/19 0616  NA 132*   < >  --  132* 133*  K 3.7   < >  --  3.9 4.0  CL 98   < >  --  98 97*  CO2 24   < >  --  25 27  GLUCOSE 104*   < >  --  134* 107*  BUN 8   < >  --  10 10  CREATININE 0.72   < >  --  0.69 0.76  CALCIUM 8.6*   < >  --  8.7* 8.9  MG 2.0  --  2.0  --   --   PHOS 3.1  --  2.8  --   --    < > = values in this interval not displayed.    Lipid Panel:     Component Value Date/Time   CHOL 128 11/05/2019 0552   TRIG 82 11/05/2019 0552   HDL 37 (L) 11/05/2019 0552   CHOLHDL 3.5 11/05/2019 0552   VLDL 16 11/05/2019 0552   LDLCALC 75 11/05/2019 0552   HgbA1c:  Lab Results  Component Value Date   HGBA1C 6.0 (H) 11/05/2019   Urine Drug Screen:     Component Value Date/Time   LABOPIA NONE DETECTED 11/04/2019 1927   COCAINSCRNUR NONE DETECTED 11/04/2019 1927   LABBENZ NONE DETECTED 11/04/2019 1927   AMPHETMU NONE DETECTED 11/04/2019 1927   THCU NONE DETECTED 11/04/2019 1927   LABBARB NONE DETECTED 11/04/2019 1927    Alcohol Level No results found for: ETH  IMAGING psat 24h No results found.   PHYSICAL EXAM    General - Well nourished, well developed,  African-American male not in distress Ophthalmologic - fundi not visualized due to noncooperation.  Cardiovascular - irregularly irregular heart rate and rhythm.  Neuro - awake alert, orientated. Mild dysarthria but understandable, no aphasia. Able to name and repeat.  Left hemianopia. Eyes right gaze preference but can cross the midline and track examiner,   Left facial droop. Tongue protrusion midline. LUE at least 4+/5 and RUE 3/5 proximal and distal. LLE 4+/5 and RLE 3/5. Sensation subjectively symmetrical but complains of RLE tingling "on fire" at the thigh (in no acute distress), coordination intact FTN left hand and gait not tested.   ASSESSMENT/PLAN Nicholas Estes is a 59 y.o. male with history of hypertension presenting with acute onset of dysarthria, left hemiplegia and left facial droop. He did not receive IV t-PA due to late presentation (>4.5 hours from time of  onset) IR - Right P2A occlusion. Multiple passes performed (2x solitaire, 3x embotrap, !x aspiration) with minimal recanalization to the level of the P2P segment (TICI2A).  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 ASPECTS is 10   CTA H&N - Occlusion of the right PCA at the proximal P2 segment.   CT Perfusion - positive for penumbra  IR unsuccessful right P2 thrombectomy  MRI head 11/04/19 - bilateral PCA scattered infarcts and right medullary infarcts, small right paramesencephalic cistern SAH  CT 11/04/19 - confirmed small right perimesencephalic cistern SAH  MRI 4/19 new posterior R midbrain and high posterior L occipital lobe infarcts. R PCA infarct w/o hemorrhagic transformation. Small  R paramesencephalic cistern SAH improving.    MRI C-spine R>L PCA and brainstem infarcts, including medulla and upper spinal cord.   2D Echo - EF 55-60%. No source of  embolus. LA mildly dilated  LE venous Doppler negative for DVT  Nicholas Estes Virus 2 - negative  LDL 75  HgbA1c 6.0  UDS - negative  VTE prophylaxis - Heparin 5000 units sq tid   No antithrombotic prior to admission, now on eliquis for stroke prevention. Continue on discharge.    Therapy recommendations:  CIR   Disposition:  Pending (son lives w/ pt)  Neuro worsening with left sided weakness - BP dependent  BP low 4/17-> improved after IVF and albumin  IV bolus 1L  Albumin Q6h x 4  Repeat MRI brain shows new R midbrain infarct and high posterior L occipital lobe infarcts, confluent right PCA infarct extension and right medullary and high cervical cord infarct extension  MRI C-spine R>L PCA and brainstem infarcts, including medulla and upper spinal cord.   BP stable at goal  BP goal 120-160  on midodrine 5mg  tid   Atrial Fibrillation w/ Pauses - new diagnosis  EKG captured afib  Persistent afib on tele with a episode of 5 sec pause 4/22 overnight  Rate controlled at this time  Will consider metoprolol if RVR  On eliquis 5 bid  Respiratory distress, improved  4/20 requiring O2 and albuterol nebs.   CXR on 4/17 w/ stable cardiomegaly w/o overt edema.   On chest PT  NT suction PRN  Stable now  Alcohol abuse  Heavy drinker at home as per son  CIWA protocol for another day  Vitamin B1, multivitamin, folic acid  Seizure precaution  Hypotension  Hx of hypertension  Home BP meds: Amlodipine 10  Current BP meds: none  Was low end and now improved to 120s-140s . BP goal 120-160 . On midodrine 5  Tobacco abuse  Current smoker  Smoking cessation counseling provided  Nicotine patch provided  Pt is willing to quit  Dysphagia . Secondary to stroke . Cortrak out by pt 4/22 early am . On dysphagia 3 and thin . encourage and monitor po intake . On gentle hydration . Speech on board   Other Stroke Risk Factors  Obesity, Body mass index  is 32.99 kg/m., recommend weight loss, diet and exercise as appropriate   Other Active Problems  Code status - Full code  Meralgia paresthetica - gabapentin and lidoderm patches prn  Dry eyes - saline drops  Hospital day # 9 Continue therapies and mobilize out of bed.  Continue Eliquis for A. fib and stroke prevention.  Transfer to inpatient rehab over the next few days  when bed becomes available.  Discussed with patient and physical therapist and care team.  Greater than 50% time during this 25-minute visit was spent on counseling and coordination of care about his embolic stroke and atrial fibrillation and answering questions. Delia Heady, MD To contact Stroke Continuity provider, please refer to [email or web address]. After hours, contact General Neurology

## 2019-11-13 NOTE — Progress Notes (Signed)
Pt's HR 28-30's at 0720 per telemetry, Xu informed.

## 2019-11-13 NOTE — Plan of Care (Signed)
  Problem: Education: Goal: Knowledge of General Education information will improve Description: Including pain rating scale, medication(s)/side effects and non-pharmacologic comfort measures Outcome: Progressing   Problem: Health Behavior/Discharge Planning: Goal: Ability to manage health-related needs will improve Outcome: Progressing   Problem: Clinical Measurements: Goal: Ability to maintain clinical measurements within normal limits will improve Outcome: Progressing Goal: Will remain free from infection Outcome: Progressing Goal: Diagnostic test results will improve Outcome: Progressing Goal: Respiratory complications will improve Outcome: Progressing Goal: Cardiovascular complication will be avoided Outcome: Progressing   Problem: Activity: Goal: Risk for activity intolerance will decrease Outcome: Progressing   Problem: Nutrition: Goal: Adequate nutrition will be maintained Outcome: Progressing   Problem: Coping: Goal: Level of anxiety will decrease Outcome: Progressing   Problem: Elimination: Goal: Will not experience complications related to bowel motility Outcome: Progressing Goal: Will not experience complications related to urinary retention Outcome: Progressing   Problem: Pain Managment: Goal: General experience of comfort will improve Outcome: Progressing   Problem: Safety: Goal: Ability to remain free from injury will improve Outcome: Progressing   Problem: Skin Integrity: Goal: Risk for impaired skin integrity will decrease Outcome: Progressing   Problem: Education: Goal: Knowledge of disease or condition will improve Outcome: Progressing Goal: Knowledge of secondary prevention will improve Outcome: Progressing Goal: Knowledge of patient specific risk factors addressed and post discharge goals established will improve Outcome: Progressing Goal: Individualized Educational Video(s) Outcome: Progressing   Problem: Coping: Goal: Will verbalize  positive feelings about self Outcome: Progressing   Problem: Self-Care: Goal: Ability to participate in self-care as condition permits will improve Outcome: Progressing   Problem: Ischemic Stroke/TIA Tissue Perfusion: Goal: Complications of ischemic stroke/TIA will be minimized Outcome: Progressing

## 2019-11-13 NOTE — TOC Progression Note (Signed)
Transition of Care [facility]) - Progression Note    Patient Details  Name: Chandlor Noecker MRN: [id number] Date of Birth: 11-30-1960  Transition of [facility]) CM/SW Contact  Kermit Balo, RN Phone Number: 11/13/2019, 4:17 PM  Clinical Narrative:    CM has faxed updated PT and MD notes to [facility] so they can start authorization through [employer]/ [place].  TOC following.   Expected Discharge Plan: IP Rehab Facility Barriers to Discharge: Continued Medical Work up  Expected Discharge Plan and Services Expected Discharge Plan: IP Rehab Facility   Discharge Planning Services: CM Consult Post Acute Care Choice: IP Rehab Living arrangements for the past 2 months: Single Family Home                                       Social Determinants of Health (SDOH) Interventions    Readmission Risk Interventions No flowsheet data found.

## 2019-11-13 NOTE — Progress Notes (Signed)
Physical Therapy Treatment Patient Details Name: Nicholas Estes MRN: [id number] DOB: Dec 03, 1960 Today's Date: 11/13/2019    History of Present Illness  59 y.o. male presenting from home with acute onset of dysarthria, left hemiplegia and left facial droop. Delayed coming to ED therefore no tPA. NIHSS 14 Interventional radiology for right PCA occlusion with minimal recanalization completed. 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     PT Comments    Pt in bed upon arrival of PT, agreeable to PT session with focus of mobilizing to recliner. The pt continues to demo sig deficits with coordinated mobility of BUE and LLE, as well as functional strength, and seated stability. The pt continues to benefit from maxA of 2 to complete bed mobility, and max/total assist for transfers. The stedy was unavailable today, so the pt attempted lateral scooting to recliner and although initially able to clear his hips, the pt demos poor endurance and was unsuccessfully able to scoot to the recliner without sig assist of 2. Upon arrival to the recliner, the back of the recliner would not return to upright position, and therefore, we used a lift to return the pt to bed from the recliner. The pt requires significant verbal cues and assist for all mobility, and will therefore benefit from intensive therapies to maximize rehab and potential to return to prior level of mobility and independence.     Follow Up Recommendations  CIR;Supervision/Assistance - 24 hour     Equipment Recommendations  (defer to post acute)    Recommendations for  Other Services       Precautions / Restrictions Precautions Precautions: Fall Precaution Comments: bilat hemiparesis with ataxia Restrictions Weight Bearing Restrictions: No    Mobility  Bed Mobility Overal bed mobility: Needs Assistance Bed Mobility: Supine to Sit     Supine to sit: Max assist;+2 for physical assistance;+2 for safety/equipment     General bed mobility comments: pt able to progress BLE to EOB with sig VC and minA. Poorly coordinated pushing with LUE (arm in full ext with pt actively pushing while arm off bed, pt did not realize until cued). maxA to come to sit with mod/maxA to maintain sitting postion  Transfers Overall transfer level: Needs assistance Equipment used: 2 person hand held assist(recommend use of sedy for safety of all parties. attempted lateral scoot to recliner which was unsuccessful. recommend RN staff use lift.) Transfers: Lateral/Scoot Transfers;Sit to/from Stand Sit to Stand: +2 physical assistance;Max assist;From elevated surface         General transfer comment: maxA of 2 with HHA to clear hips minimally from bed x 2. Pt eager to try transition to chair, attempted lateral scoot with drop arm recliner. pt able to successfully scoot once withheavy maxA, then no longer successful, heavy max of 2 to position in chair. lift to return to bed  Ambulation/Gait Ambulation/Gait assistance: (pt unable)               Stairs             Wheelchair Mobility    Modified Rankin (Stroke Patients Only) Modified Rankin (Stroke Patients Only) Pre-Morbid Rankin Score: No symptoms  Modified Rankin: Severe disability     Balance Overall balance assessment: Needs assistance Sitting-balance support: Feet supported;Bilateral upper extremity supported Sitting balance-Leahy Scale: Poor Sitting balance - Comments: mod/maxA of 1 to stabilize in bed, L lateral lean. pt able to correct only when cued. poor hand positioning without cues. pt could  fall in any direction without support Postural control: Left lateral lean;Posterior lean Standing balance support: Bilateral upper extremity supported Standing balance-Leahy Scale: Zero Standing balance comment: unable to progress into full upright posture                            Cognition Arousal/Alertness: Awake/alert Behavior During Therapy: WFL for tasks assessed/performed Overall Cognitive Status: Impaired/Different from baseline Area of Impairment: Attention;Safety/judgement                   Current Attention Level: Sustained   Following Commands: Follows one step commands consistently;Follows one step commands with increased time Safety/Judgement: Decreased awareness of deficits;Decreased awareness of safety   Problem Solving: Decreased initiation;Requires verbal cues;Requires tactile cues General Comments: decreased awareness of deficits, decreased body awareness, left inattention;slow processing, often requiring multiple VCs to attened body parts or fix positioning      Exercises Other Exercises Other Exercises: lateral lean to elbow - x5 in each direction, pt able to initiate lean, but required modA tactile facilitation at pelvis and modA at trunk to achieve midline    General Comments General comments (skin integrity, edema, etc.): stedy should be used in future, or maxilift for RN staff      Pertinent Vitals/Pain Pain Assessment: Faces Faces Pain Scale: Hurts even more Pain Location: R hip "stiffness" Pain Descriptors / Indicators: Tightness;Aching;Sharp Pain Intervention(s): Limited activity within patient's tolerance;Monitored during session;Repositioned    Home Living                      Prior Function            PT Goals (current goals can now be found in the care plan section) Acute Rehab PT Goals Patient Stated Goal: regain function PT Goal Formulation: With patient/family Time For Goal Achievement: 11/20/19 Potential  to Achieve Goals: Fair Progress towards PT goals: Progressing toward goals    Frequency    Min 4X/week      PT Plan Current plan remains appropriate    Co-evaluation              AM-PAC PT "6 Clicks" Mobility   Outcome Measure  Help needed turning from your back to your side while in a flat bed without using bedrails?: A Lot Help needed moving from lying on your back to sitting on the side of a flat bed without using bedrails?: A Lot Help needed moving to and from a bed to a chair (including a wheelchair)?: Total Help needed standing up from a chair using your arms (e.g., wheelchair or bedside chair)?: Total Help needed to walk in hospital room?: Total Help needed climbing 3-5 steps with a railing? : Total 6 Click Score: 8    End of Session Equipment Utilized During Treatment: Gait belt(lift) Activity Tolerance: Patient limited by fatigue;Patient limited by pain Patient left: with call bell/phone within reach;in bed;with bed alarm set;with family/visitor present Nurse Communication: Mobility status;Need for lift equipment PT Visit Diagnosis: Hemiplegia and hemiparesis;Pain;Muscle weakness (generalized) (M62.81);Ataxic gait (R26.0);Difficulty in walking, not elsewhere classified (R26.2) Hemiplegia - Right/Left: Right Hemiplegia - dominant/non-dominant: Dominant Hemiplegia -  caused by: Cerebral infarction Pain - Right/Left: Right Pain - part of body: Hip     Time: [id number] PT Time Calculation (min) (ACUTE ONLY): 61 min  Charges:  $Therapeutic Activity: 38-52 mins $Neuromuscular Re-education: 8-22 mins                     Nicholas Estes, PT, DPT   Acute Rehabilitation Department Pager #: [phone]   Gaetana Michaelis 11/13/2019, 12:57 PM

## 2019-11-13 NOTE — Consult Note (Signed)
Physical Medicine and Rehabilitation Consult   Reason for Consult: Stroke with functional deficits.  Referring Physician: Dr. Pearlean Brownie   HPI: Nicholas Estes is a 59 y.o. male who was admitted on 11/04/2019 with acute onset of dysarthria, left sided weakness and left facial droop.  CTA head/neck/perfusion showed occlusion of right PCA proximal P2 segment and 31 mL of ischemic penumbra in right occipital lobe.  EKG revealed A. fib with RVR.  He underwent cerebral angio with minimal recanalization of right P2 a occlusion by Dr. Dorita Sciara.  MRI brain showed bilateral PCA scattered infarcts and medullary infarcts, small right perimesencephalic cistern SAH.  BLE Dopplers were negative for DVT.  Stroke felt to be embolic due to A. fib.  Patient developed decline in mental status with lethargy as well as worsening of right-sided weakness with flaccid RUE, RLE>LLE weakness, right gaze preference with left hemianopsia on 04/18.  Blood pressures on the low side therefore he was started on IV fluid as well as albumin for BP support.  Repeat MRI brain showed extension of right PCA infarct as well as new acute/subacute infarcts involving posterior right midbrain and high posterior left occipital lobe. 2D echo showed EF 55-60% with moderate asymmetric LVH and mild dilatation of ascending aorta 37 mm.   Midodrine added for BP support and started on CIWA protocol due to history fo ETOH abuse. He was started on tube feeds for nutritional support and mentation slowly improving.  MRI cervical spine showed known infarcts right greater than left PCA territory and brainstem including middle and upper cervical spinal cord and cervical spondylosis with severe neural foraminal stenosis C4-C7. Right sided weakness improving and on on dysphagia 3, thins.  DOAC held till 4/23.  Therapy ongoing and patient limited by left> right sided weakness, visual deficits, cognitive deficits and working on pregait activity. CIR recommended due  to functional decline.      Review of Systems  Constitutional: Negative for fever.  HENT: Negative for hearing loss.   Eyes: Negative for pain.  Respiratory: Negative for cough.   Cardiovascular: Negative for chest pain.  Gastrointestinal: Negative for abdominal pain and nausea.  Genitourinary: Negative for dysuria.  Musculoskeletal: Negative for myalgias.  Skin: Negative for rash.  Neurological: Positive for sensory change and focal weakness.  Psychiatric/Behavioral: Negative for suicidal ideas.     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 No pertinent family history.    Social History:   Married. Independent PTA.    Allergies: No Known Allergies    Medications Prior to Admission  Medication Sig Dispense Refill   amLODipine (NORVASC) 10 MG tablet Take 10 mg by mouth daily.      Home: Home Living Family/patient expects to be discharged to:: Private residence Living Arrangements: Children Available Help at Discharge: Family, Available PRN/intermittently Type of Home: House Home Access: Stairs to enter [employer] of Steps: 2 Entrance Stairs-Rails: None Home Layout: One level Bathroom  Shower/Tub: Engineer, manufacturing systems: Standard Home Equipment: Crutches  Functional History: Prior Function Level of Independence: Independent Comments: working independently, International aid/development worker, buy/selling storage units, son cooks, pt drives and does all  other ADls Functional Status:  Mobility: Bed Mobility Overal bed mobility: Needs Assistance Bed Mobility: Supine to Sit Rolling: Max assist, +2 for physical assistance Sidelying to sit: Total assist, +2 for physical assistance Supine to sit: Max assist, +2 for physical assistance, +2 for safety/equipment Sit to supine: +2 for physical assistance, Total assist Sit to sidelying: Total assist, +2 for physical assistance General bed mobility comments: pt attempting to progress LLE to EOB, assist for RLE management, attempted to grasp bed rail with LUE, no functional use of RUE, maxA+2 to progress trunk to upright position Transfers Overall transfer level: Needs assistance Transfer via Lift Equipment: Stedy Transfers: Sit to/from Stand Sit to Stand: +2 physical assistance, Max assist General transfer comment: sit<>stand maxA+2 into stedy, and again from stedy into recliner. Ambulation/Gait General Gait Details: unable at this time    ADL: ADL Overall ADL's : Needs assistance/impaired Eating/Feeding: Moderate assistance, Sitting Eating/Feeding Details (indicate cue type and reason): full supervision per SLP  Grooming: Maximal assistance Toilet Transfer: Maximal assistance, +2 for physical assistance, +2 for safety/equipment Toilet Transfer Details (indicate cue type and reason): used stedy to transfer pt from EOB to recliner General ADL Comments: maxA for majority of ADL, pt limited by decreased cognition, visual limitations, decreased strength and use of BUE  Cognition: Cognition Overall Cognitive Status: Impaired/Different from baseline Arousal/Alertness: Lethargic Orientation Level: Oriented X4 Attention: Focused, Sustained Focused Attention: Impaired Focused Attention Impairment: Verbal complex Sustained Attention: Impaired Sustained Attention Impairment: Verbal complex Awareness: Impaired Awareness Impairment: Intellectual impairment Cognition Arousal/Alertness:  Awake/alert Behavior During Therapy: WFL for tasks assessed/performed Overall Cognitive Status: Impaired/Different from baseline Area of Impairment: Attention, Safety/judgement Current Attention Level: Sustained Following Commands: Follows one step commands consistently, Follows one step commands with increased time Safety/Judgement: Decreased awareness of deficits, Decreased awareness of safety Problem Solving: Decreased initiation, Requires verbal cues, Requires tactile cues General Comments: decreased awareness of deficits, decreased body awareness, left inattention;slow processing,  Difficult to assess due to: Level of arousal(pt given ativan)  Blood pressure (!) 143/94, pulse 79, temperature 97.8 F (36.6 C), temperature source Oral, resp. rate 20, height 5\' 11"  (1.803 m), weight 107.3 kg, SpO2 94 %. Physical Exam  Constitutional: He is oriented to person, place, and time. He appears well-nourished.  HENT:  Head: Normocephalic and atraumatic.  Cardiovascular: Normal rate.  Respiratory: Effort normal.  GI: Soft.  Musculoskeletal:        General: No edema. Normal range of motion.     Cervical back: Normal range of motion.  Neurological: He is alert and oriented to person, place, and time.  Left Homonymous hemianopsia, LUE>LLE sensory loss, Motor: 5-/5 LUE and LLE. RUE 4-/5, RLE 4/5. DTR's 1+  Skin: Skin is warm.  Psychiatric: He has a normal mood and affect. His behavior is normal.    No results found for this or any previous visit (from the past 24 hour(s)). No results found.   Assessment/Plan: Diagnosis: bilateral posterior circulation infarcts, embolic d/t new  A-fb  1. Does the need for close, 24 hr/day medical supervision in concert with the patient's rehab needs make it unreasonable for this patient to be served in a less intensive setting? Yes 2. Co-Morbidities requiring supervision/potential complications: etoh, abuse, htn 3. Due to bladder management, bowel  management, safety, skin/wound care, disease management,  medication administration, pain management and patient education, does the patient require 24 hr/day rehab nursing? Yes 4. Does the patient require coordinated care of a physician, rehab nurse, therapy disciplines of PT, OT, and SLP to address physical and functional deficits in the context of the above medical diagnosis(es)? Yes Addressing deficits in the following areas: balance, endurance, locomotion, strength, transferring, bowel/bladder control, bathing, dressing, feeding, grooming, toileting and psychosocial support, swallowing 5. Can the patient actively participate in an intensive therapy program of at least 3 hrs of therapy per day at least 5 days per week? Yes 6. The potential for patient to make measurable gains while on inpatient rehab is excellent 7. Anticipated functional outcomes upon discharge from inpatient rehab are modified independent and supervision  with PT, modified independent and supervision with OT, modified independent with SLP. 8. Estimated rehab length of stay to reach the above functional goals is: 20-25 days 9. Anticipated discharge destination: Home 10. Overall Rehab/Functional Prognosis: excellent  RECOMMENDATIONS: This patient's condition is appropriate for continued rehabilitative care in the following setting: CIR Patient has agreed to participate in recommended program. Yes Note that insurance prior authorization may be required for reimbursement for recommended care.  Comment: Rehab Admissions Coordinator to follow up.  Thanks,  Meredith Staggers, MD, Mellody Drown  I have personally performed a face to face diagnostic evaluation of this patient. Additionally, I have examined pertinent labs and radiographic images. I have reviewed and concur with the physician assistant's documentation above.    Bary Leriche, PA-C 11/13/2019

## 2019-11-13 NOTE — Plan of Care (Signed)

## 2019-11-14 NOTE — Progress Notes (Signed)
SLP Cancellation Note  Patient Details Name: Merit Maybee MRN: [id number] DOB: 1961/04/19   Cancelled treatment:       Reason Eval/Treat Not Completed: Other (comment) Pt currently receiving nursing care. SLP was asked to come back later. Will f/u as schedule allows.    Mahala Menghini., M.A. CCC-SLP Acute Rehabilitation Services Pager [phone] Office [phone]  11/14/2019, 3:17 PM

## 2019-11-14 NOTE — Progress Notes (Signed)
STROKE TEAM PROGRESS NOTE   INTERVAL HISTORY Patient is sitting up in bed.  His wife is at the bedside..  No neurological changes.  Vital signs stable.  Is tolerating Eliquis.  His insurance will not allow him to go to rehab at [facility] and we are exploring options at [place] inpatient rehab  OBJECTIVE Vitals:   11/13/19 2352 11/14/19 0350 11/14/19 0927 11/14/19 1052  BP: 116/83 118/67 (!) 137/91 (!) 150/79  Pulse: 60 91 85 68  Resp: 18 18 18 17   Temp: 98.3 F (36.8 C) 98.3 F (36.8 C) 98.2 F (36.8 C) 97.8 F (36.6 C)  TempSrc: Oral Oral Oral Oral  SpO2: 93% 100% 93% 96%  Weight:      Height:        CBC:  Recent Labs  Lab 11/08/19 0629 11/09/19 0616  WBC 8.2 6.2  HGB 13.8 13.6  HCT 39.7 39.3  MCV 91.3 94.5  PLT 220 009    Basic Metabolic Panel:  Recent Labs  Lab 11/07/19 1640 11/08/19 0629 11/09/19 0616  NA  --  132* 133*  K  --  3.9 4.0  CL  --  98 97*  CO2  --  25 27  GLUCOSE  --  134* 107*  BUN  --  10 10  CREATININE  --  0.69 0.76  CALCIUM  --  8.7* 8.9  MG 2.0  --   --   PHOS 2.8  --   --     Lipid Panel:     Component Value Date/Time   CHOL 128 11/05/2019 0552   TRIG 82 11/05/2019 0552   HDL 37 (L) 11/05/2019 0552   CHOLHDL 3.5 11/05/2019 0552   VLDL 16 11/05/2019 0552   LDLCALC 75 11/05/2019 0552   HgbA1c:  Lab Results  Component Value Date   HGBA1C 6.0 (H) 11/05/2019   Urine Drug Screen:     Component Value Date/Time   LABOPIA NONE DETECTED 11/04/2019 1927   COCAINSCRNUR NONE DETECTED 11/04/2019 1927   LABBENZ NONE DETECTED 11/04/2019 1927   AMPHETMU NONE DETECTED 11/04/2019 1927   THCU NONE DETECTED 11/04/2019 1927   LABBARB NONE DETECTED 11/04/2019 1927    Alcohol Level No results found for: ETH  IMAGING psat 24h No results found.   PHYSICAL EXAM    General - Well nourished, well developed, African-American male not in distress Ophthalmologic - fundi not visualized due to noncooperation.  Cardiovascular - irregularly  irregular heart rate and rhythm.  Neuro - awake alert, orientated. Mild dysarthria but understandable, no aphasia. Able to name and repeat.  Left hemianopia. Eyes right gaze preference but can cross the midline and track examiner,   Left facial droop. Tongue protrusion midline. LUE at least 4+/5 and RUE 3/5 proximal and distal. LLE 4+/5 and RLE 3/5. Sensation subjectively symmetrical but complains of RLE tingling "on fire" at the thigh (in no acute distress), coordination intact FTN left hand and gait not tested.   ASSESSMENT/PLAN [name] is a 59 y.o. male with history of hypertension presenting with acute onset of dysarthria, left hemiplegia and left facial droop. He did not receive IV t-PA due to late presentation (>4.5 hours from time of onset) IR - Right P2A occlusion. Multiple passes performed (2x solitaire, 3x embotrap, !x aspiration) with minimal recanalization to the level of the P2P segment (TICI2A).  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 ASPECTS is 10   CTA H&N - Occlusion of the right PCA at the proximal P2 segment.   CT Perfusion - positive for penumbra  IR unsuccessful right P2 thrombectomy  MRI head 11/04/19 - bilateral PCA scattered infarcts and right medullary infarcts, small right paramesencephalic cistern SAH  CT 11/04/19 - confirmed small right perimesencephalic cistern SAH  MRI 4/19 new posterior R midbrain and high posterior L occipital lobe infarcts. R PCA infarct w/o hemorrhagic transformation. Small  R paramesencephalic cistern SAH improving.    MRI C-spine R>L PCA and brainstem infarcts, including medulla and upper spinal cord.   2D Echo - EF 55-60%. No source of embolus. LA mildly dilated  LE venous Doppler negative for DVT  Nicholas Estes Virus 2 - negative  LDL 75  HgbA1c 6.0  UDS -  negative  VTE prophylaxis - Heparin 5000 units sq tid   No antithrombotic prior to admission, now on eliquis for stroke prevention. Continue on discharge.    Therapy recommendations:  CIR   Disposition:  Pending (son lives w/ pt)  Neuro worsening with left sided weakness - BP dependent  BP low 4/17-> improved after IVF and albumin  IV bolus 1L  Albumin Q6h x 4  Repeat MRI brain shows new R midbrain infarct and high posterior L occipital lobe infarcts, confluent right PCA infarct extension and right medullary and high cervical cord infarct extension  MRI C-spine R>L PCA and brainstem infarcts, including medulla and upper spinal cord.   BP stable at goal  BP goal 120-160  on midodrine 5mg  tid   Atrial Fibrillation w/ Pauses - new diagnosis  EKG captured afib  Persistent afib on tele with a episode of 5 sec pause 4/22 overnight  Rate controlled at this time  Will consider metoprolol if RVR  On eliquis 5 bid  Respiratory distress, improved  4/20 requiring O2 and albuterol nebs.   CXR on 4/17 w/ stable cardiomegaly w/o overt edema.   On chest PT  NT suction PRN  Stable now  Alcohol abuse  Heavy drinker at home as per son  CIWA protocol for another day  Vitamin B1, multivitamin, folic acid  Seizure precaution  Hypotension  Hx of hypertension  Home BP meds: Amlodipine 10  Current BP meds: none  Was low end and now improved to 120s-140s . BP goal 120-160 . On midodrine 5  Tobacco abuse  Current smoker  Smoking cessation counseling provided  Nicotine patch provided  Pt is willing to quit  Dysphagia . Secondary to stroke . Cortrak out by pt 4/22 early am . On dysphagia 3 and thin . encourage and monitor po intake . On gentle hydration . Speech on board   Other Stroke Risk Factors  Obesity, Body mass index is 32.99 kg/m., recommend weight loss, diet and exercise as appropriate   Other Active Problems  Code status - Full  code  Meralgia paresthetica - gabapentin and lidoderm patches prn  Dry eyes - saline drops  Hospital day # 10 Continue therapies and mobilize out of bed.  Continue Eliquis for A. fib and stroke prevention.  Transfer to inpatient rehab at [facility] over the next few days when bed becomes available.  Discussed with patient and his wife and care team.  Greater than 50% time during this 25-minute visit was spent on counseling and coordination of care about his embolic stroke and atrial fibrillation and answering questions. [facility], MD To contact Stroke Continuity provider, please  refer to [email or web address] After hours, contact General Neurology

## 2019-11-14 NOTE — Progress Notes (Signed)
Occupational Therapy Treatment Patient Details Name: Nicholas Estes MRN: [id number] DOB: [birth year] Today's Date: 11/14/2019    History of present illness  59 y.o. male presenting from home with acute onset of dysarthria, left hemiplegia and left facial droop. Delayed coming to ED therefore no tPA. NIHSS 14 Interventional radiology for right PCA occlusion with minimal recanalization completed. 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    OT comments  Patient continues to make steady progress towards goals in skilled OT session. Patient's session encompassed co-treat with PT in order to address functional mobility in transfer and continue to advance towards goals. Pt continues to demonstrate bilateral weakness and increased extensor tone on left (especially when manipulated at trunk to challenge balance or weight shift). Pt motivated and participatory however requires increased cues for attention and positioning in order to maintain upright posture. Pt dependent to bring anything to mouth due to decreased ability grasp and hold. Pt would continue to benefit from skilled services in order to address deficits; will continue to follow.   Follow Up Recommendations  CIR;Supervision/Assistance - 24 hour    Equipment Recommendations  Other (comment)(defer to next venue)    Recommendations for Other Services      Precautions / Restrictions Precautions Precautions: Fall Precaution Comments: bilat hemiparesis with ataxia Restrictions Weight Bearing Restrictions: No       Mobility Bed Mobility Overal bed mobility:  Needs Assistance Bed Mobility: Supine to Sit     Supine to sit: Max assist;+2 for physical assistance;+2 for safety/equipment     General bed mobility comments: Pt continues to require cues to coordinate BLE to come to side of bed, able to incorportate BUEs to use bed rail to bring trunk up with increased proficiency to date, however requires cues to transition into sitting (giving hand to therapist) in order to not scoot hips too far off bed. LUE continues to demonstrate extensor tone in movement, however is is demonstrating increased ability to shift at the trunk in order to regain proprioception  Transfers Overall transfer level: Needs assistance Equipment used: Ambulation equipment used Transfers: Sit to/from Stand Sit to Stand: +2 physical assistance;Max assist;From elevated surface         General transfer comment: Continues to require cues for hand placement, but is able to power up from an elevated surface, fatigues quickly and requires mod A and cues in order to maintain upright posture in stedy    Balance Overall balance assessment: Needs assistance Sitting-balance support: Feet supported;Bilateral upper extremity supported Sitting balance-Leahy Scale: Poor Sitting balance - Comments: mod/maxA of 1 to stabilize in bed, L lateral lean. pt able to correct only when cued. poor hand positioning without cues. pt could fall in any direction without support Postural control: Left lateral lean;Posterior lean Standing balance support: Bilateral upper extremity supported Standing balance-Leahy Scale: Zero Standing balance comment: requires cues to maintain upright posture and mod A x1                           ADL either performed or assessed with clinical judgement   ADL Overall ADL's : Needs assistance/impaired  Eating/Feeding: Moderate assistance;Sitting Eating/Feeding Details (indicate cue type and reason): Pt continues to demonstrate difficulty grasping cup and  bringing to mouth                 Lower Body Dressing: Total assistance Lower Body Dressing Details (indicate cue type and reason): Donning Socks Toilet Transfer: Maximal assistance;+2 for physical assistance;+2 for safety/equipment Toilet Transfer Details (indicate cue type and reason): used stedy to transfer pt from EOB to recliner         Functional mobility during ADLs: Maximal assistance;+2 for physical assistance;+2 for safety/equipment;Cueing for safety;Cueing for sequencing General ADL Comments: maxA for majority of ADL, pt limited by decreased cognition, visual limitations, decreased strength and use of BUE     Vision       Perception     Praxis      Cognition Arousal/Alertness: Awake/alert Behavior During Therapy: WFL for tasks assessed/performed Overall Cognitive Status: Impaired/Different from baseline Area of Impairment: Attention;Safety/judgement                   Current Attention Level: Sustained   Following Commands: Follows one step commands consistently;Follows one step commands with increased time Safety/Judgement: Decreased awareness of deficits;Decreased awareness of safety   Problem Solving: Decreased initiation;Requires verbal cues;Requires tactile cues General Comments: decreased awareness of deficits, decreased body awareness, left inattention;slow processing, often requiring multiple VCs to attened body parts or fix positioning        Exercises     Shoulder Instructions       General Comments      Pertinent Vitals/ Pain       Pain Assessment: Faces Faces Pain Scale: Hurts a little bit Pain Location: R hip "stiffness" Pain Descriptors / Indicators: Tightness;Aching;Sharp Pain Intervention(s): Limited activity within patient's tolerance;Monitored during session;Repositioned  Home Living                                          Prior Functioning/Environment              Frequency  Min 2X/week         Progress Toward Goals  OT Goals(current goals can now be found in the care plan section)  Progress towards OT goals: Progressing toward goals  Acute Rehab OT Goals Patient Stated Goal: regain function OT Goal Formulation: With patient Time For Goal Achievement: 11/20/19 Potential to Achieve Goals: Good  Plan Discharge plan remains appropriate    Co-evaluation    PT/OT/SLP Co-Evaluation/Treatment: Yes Reason for Co-Treatment: Complexity of the patient's impairments (multi-system involvement);Necessary to address cognition/behavior during functional activity;For patient/therapist safety;To address functional/ADL transfers   OT goals addressed during session: ADL's and self-care;Strengthening/ROM      AM-PAC OT "6 Clicks" Daily Activity     Outcome Measure   Help from another person eating meals?: Total Help from another person taking care of personal grooming?: A Lot Help from another person toileting, which includes using toliet, bedpan, or urinal?: A Lot Help from another person bathing (including washing, rinsing, drying)?: A Lot Help from another person to put on and taking off regular upper body clothing?: A Lot Help from another person to put on and taking off regular lower body clothing?: A Lot 6 Click Score: 11    End of Session Equipment Utilized During Treatment: Gait belt(stedy)  OT Visit Diagnosis: Unsteadiness on feet (R26.81);Other abnormalities of gait and mobility (R26.89);Muscle  weakness (generalized) (M62.81);Pain Pain - Right/Left: Right Pain - part of body: Hip   Activity Tolerance Patient tolerated treatment well   Patient Left with call bell/phone within reach;in chair;with chair alarm set   Nurse Communication Mobility status        Time: 4268-3419 OT Time Calculation (min): 28 min  Charges: OT General Charges $OT Visit: 1 Visit OT Treatments $Self Care/Home Management : 8-22 mins  Pollyann Glen E. Soper, COTA/L Acute  Rehabilitation Services [phone]   Chelsea Nusz 11/14/2019, 3:23 PM

## 2019-11-14 NOTE — Progress Notes (Signed)
Physical Therapy Treatment Patient Details Name: Nicholas Estes MRN: [id number] DOB: 11/23/1960 Today's Date: 11/14/2019    History of Present Illness  59 y.o. male presenting from home with acute onset of dysarthria, left hemiplegia and left facial droop. Delayed coming to ED therefore no tPA. NIHSS 14 Interventional radiology for right PCA occlusion with minimal recanalization completed. 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     PT Comments    Patient seen for mobility progression. Pt continues to present with generalized weakness and ataxia. R UE ataxia more prominent and L UE extensor tone more prominent however seen in bilat UE during session. Pt has tendency to push with L UE when attempting to right trunk to midline in sitting. Pt requires +2 assist for bed mobility and functional transfer training. Continue to recommend CIR for further skilled PT services to maximize independence and safety with mobility.    Follow Up Recommendations  CIR;Supervision/Assistance - 24 hour     Equipment Recommendations  (defer to post acute)    Recommendations for Other Services       Precautions / Restrictions Precautions Precautions: Fall Precaution Comments: bilat hemiparesis with ataxia Restrictions Weight Bearing Restrictions: No    Mobility  Bed Mobility Overal bed mobility: Needs Assistance Bed Mobility: Supine to Sit     Supine to sit: Max assist;+2 for physical assistance;+2 for safety/equipment     General bed mobility comments: Pt continues to require cues to coordinate BLE to come to side  of bed, able to incorportate BUEs to use bed rail to bring trunk up with increased proficiency to date, however requires cues to transition into sitting (giving hand to therapist) in order to not scoot hips too far off bed. LUE continues to demonstrate extensor tone in movement, however is is demonstrating increased ability to shift at the trunk in order to regain proprioception  Transfers Overall transfer level: Needs assistance Equipment used: Ambulation equipment used Transfers: Sit to/from Stand Sit to Stand: +2 physical assistance;Max assist;From elevated surface         General transfer comment: hand over hand assist for placement of R hand on Stedy frame; +2 assist to power up and maintain standing from elevated bed height and standing frame seat; pt unable to achieve full upright posture  Ambulation/Gait                 Stairs             Wheelchair Mobility    Modified Rankin (Stroke Patients Only) Modified Rankin (Stroke Patients Only) Pre-Morbid Rankin Score: No symptoms Modified Rankin: Severe disability     Balance Overall balance assessment: Needs assistance Sitting-balance support: Feet supported;Bilateral upper extremity supported Sitting balance-Leahy Scale: Poor Sitting balance - Comments: poor to zero Postural control: Left lateral lean;Posterior lean Standing balance support: Bilateral upper extremity supported Standing balance-Leahy Scale: Zero Standing balance comment: requires cues to maintain upright posture and mod A x1                            Cognition Arousal/Alertness: Awake/alert Behavior During Therapy: [facility] for  tasks assessed/performed Overall Cognitive Status: Impaired/Different from baseline Area of Impairment: Attention;Safety/judgement                   Current Attention Level: Sustained   Following Commands: Follows one step commands consistently;Follows one step commands with increased  time Safety/Judgement: Decreased awareness of deficits;Decreased awareness of safety   Problem Solving: Decreased initiation;Requires verbal cues;Requires tactile cues General Comments: decreased awareness of deficits, decreased body awareness, left inattention;slow processing, often requiring multiple VCs to attened body parts or fix positioning      Exercises      General Comments        Pertinent Vitals/Pain Pain Assessment: Faces Faces Pain Scale: Hurts a little bit Pain Location: R hip "stiffness" Pain Descriptors / Indicators: Tightness;Aching;Sharp Pain Intervention(s): Limited activity within patient's tolerance;Monitored during session;Repositioned    Home Living                      Prior Function            PT Goals (current goals can now be found in the care plan section) Acute Rehab PT Goals Patient Stated Goal: regain function Progress towards PT goals: Progressing toward goals    Frequency    Min 4X/week      PT Plan Current plan remains appropriate    Co-evaluation PT/OT/SLP Co-Evaluation/Treatment: Yes Reason for Co-Treatment: Complexity of the patient's impairments (multi-system involvement);Necessary to address cognition/behavior during functional activity;For patient/therapist safety;To address functional/ADL transfers PT goals addressed during session: Mobility/safety with mobility;Balance OT goals addressed during session: ADL's and self-care;Strengthening/ROM      AM-PAC PT "6 Clicks" Mobility   Outcome Measure  Help needed turning from your back to your side while in a flat bed without using bedrails?: A Lot Help needed moving from lying on your back to sitting on the side of a flat bed without using bedrails?: A Lot Help needed moving to and from a bed to a chair (including a wheelchair)?: Total Help needed standing up from a chair using your arms (e.g., wheelchair or bedside chair)?: A Lot Help needed to walk in hospital  room?: Total Help needed climbing 3-5 steps with a railing? : Total 6 Click Score: 9    End of Session Equipment Utilized During Treatment: Gait belt Activity Tolerance: Patient tolerated treatment well Patient left: with call bell/phone within reach;in chair;with chair alarm set;Other (comment)(lift pad in chair) Nurse Communication: Mobility status;Need for lift equipment PT Visit Diagnosis: Hemiplegia and hemiparesis;Pain;Muscle weakness (generalized) (M62.81);Ataxic gait (R26.0);Difficulty in walking, not elsewhere classified (R26.2) Hemiplegia - Right/Left: Right Hemiplegia - dominant/non-dominant: Dominant Hemiplegia - caused by: Cerebral infarction Pain - Right/Left: Right Pain - part of body: Hip     Time: 1335-1403 PT Time Calculation (min) (ACUTE ONLY): 28 min  Charges:  $Therapeutic Activity: 8-22 mins                     Erline Levine, PTA Acute Rehabilitation Services Pager: [phone] Office: [phone]     Carolynne Edouard 11/14/2019, 5:30 PM

## 2019-11-14 NOTE — Progress Notes (Signed)
Inpatient Rehabilitation-Admissions Coordinator   Noted [facility] CIR currently working the case and per [facility] team, has begun insurance authorization process for possible admit. AC will sign off at this time. If for some reason, [facility] unable to accept this patient, please re-consult [facility] CIR.   Cheri Rous, OTR/L  Rehab Admissions Coordinator  [phone] 11/14/2019 10:02 AM

## 2019-11-14 NOTE — Progress Notes (Signed)
Notified by Central Tele at 0759 ECG HR reading 26. This author entered the room to assess patient. Apical heart rate auscultated HR 68 and ECG HR reading 70 on tele box. Leads adjusted in proper position. Patient stable at this time without complaints. Will continue to monitor.

## 2019-11-14 NOTE — Plan of Care (Signed)

## 2019-11-15 DIAGNOSIS — R1312 Dysphagia, oropharyngeal phase: Secondary | ICD-10-CM

## 2019-11-15 DIAGNOSIS — R531 Weakness: Secondary | ICD-10-CM

## 2019-11-15 DIAGNOSIS — R4781 Slurred speech: Secondary | ICD-10-CM

## 2019-11-15 DIAGNOSIS — I63541 Cerebral infarction due to unspecified occlusion or stenosis of right cerebellar artery: Secondary | ICD-10-CM

## 2019-11-15 MED ORDER — GABAPENTIN 300 MG PO CAPS: 300.0000 mg | ORAL_CAPSULE | Freq: Three times a day (TID) | ORAL | 1 refills | Status: DC

## 2019-11-15 MED ORDER — APIXABAN 5 MG PO TABS: 5.0000 mg | ORAL_TABLET | Freq: Two times a day (BID) | ORAL | 0 refills | Status: AC

## 2019-11-15 MED ORDER — MIDODRINE HCL 5 MG PO TABS: 5.0000 mg | ORAL_TABLET | Freq: Three times a day (TID) | ORAL | 0 refills | Status: DC

## 2019-11-15 MED ORDER — ACETAMINOPHEN 325 MG PO TABS: 650.0000 mg | ORAL_TABLET | ORAL | Status: DC | PRN

## 2019-11-15 MED ORDER — THIAMINE HCL 100 MG PO TABS: 100.0000 mg | ORAL_TABLET | Freq: Every day | ORAL | Status: DC

## 2019-11-15 MED ORDER — ALBUTEROL SULFATE (2.5 MG/3ML) 0.083% IN NEBU: 2.5000 mg | INHALATION_SOLUTION | RESPIRATORY_TRACT | 12 refills | Status: DC | PRN

## 2019-11-15 MED ORDER — FOLIC ACID 1 MG PO TABS: 1.0000 mg | ORAL_TABLET | Freq: Every day | ORAL | Status: DC

## 2019-11-15 NOTE — Discharge Summary (Addendum)
Stroke Discharge Summary  Patient ID: Nicholas Estes   MRN: [id number]      DOB: 1961/01/06  Date of Admission: 11/04/2019 Date of Discharge: 11/15/2019  Attending Physician:  Garvin Fila, MD, Stroke MD Consultant(s): rehab team Patient's PCP:  No primary care provider on file.  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 s/p unsuccessful thrombectomy with postprocedure small SAH - embolic likely due to new diagnosed atrial fibrillation Afib Hypotension ETOH abuse Dysphagia Left hemiplegia Active Problems:   Stroke (cerebrum) (HCC)   Stroke (HCC)   Medications to be continued on Rehab Allergies as of 11/15/2019   No Known Allergies      Medication List     STOP taking these medications    amLODipine 10 MG tablet Commonly known as: NORVASC       TAKE these medications    acetaminophen 325 MG tablet Commonly known as: TYLENOL Take 2 tablets (650 mg total) by mouth every 4 (four) hours as needed for mild pain (or temp > 37.5 C (99.5 F)).   albuterol (2.5 MG/3ML) 0.083% nebulizer solution Commonly known as: PROVENTIL Take 3 mLs (2.5 mg total) by nebulization every 4 (four) hours as needed for wheezing or shortness of breath.   apixaban 5 MG Tabs tablet Commonly known as: ELIQUIS Take 1 tablet (5 mg total) by mouth 2 (two) times daily.   folic acid 1 MG tablet Commonly known as: FOLVITE Take 1 tablet (1 mg total) by mouth daily. Start taking on: November 16, 2019   gabapentin 300 MG capsule Commonly known as: NEURONTIN Take 1 capsule (300 mg total) by mouth 3 (three) times daily.   midodrine 5 MG tablet Commonly known as: PROAMATINE Take 1 tablet (5 mg total) by mouth every 8 (eight) hours.   thiamine 100 MG tablet Take 1 tablet (100 mg total) by mouth daily. Start taking on: November 16, 2019        LABORATORY STUDIES CBC    Component Value Date/Time   WBC 6.2 11/09/2019 0616   RBC 4.16 (L) 11/09/2019 0616   HGB  13.6 11/09/2019 0616   HCT 39.3 11/09/2019 0616   PLT 223 11/09/2019 0616   MCV 94.5 11/09/2019 0616   MCH 32.7 11/09/2019 0616   MCHC 34.6 11/09/2019 0616   RDW 12.4 11/09/2019 0616   LYMPHSABS 1.6 11/04/2019 0415   MONOABS 0.5 11/04/2019 0415   EOSABS 0.1 11/04/2019 0415   BASOSABS 0.0 11/04/2019 0415   CMP    Component Value Date/Time   NA 133 (L) 11/09/2019 0616   K 4.0 11/09/2019 0616   CL 97 (L) 11/09/2019 0616   CO2 27 11/09/2019 0616   GLUCOSE 107 (H) 11/09/2019 0616   BUN 10 11/09/2019 0616   CREATININE 0.76 11/09/2019 0616   CALCIUM 8.9 11/09/2019 0616   PROT 7.1 11/04/2019 0415   ALBUMIN 3.9 11/04/2019 0415   AST 24 11/04/2019 0415   ALT 41 11/04/2019 0415   ALKPHOS 72 11/04/2019 0415   BILITOT 0.8 11/04/2019 0415   GFRNONAA >60 11/09/2019 0616   GFRAA >60 11/09/2019 0616   COAGS Lab Results  Component Value Date   INR 1.0 11/04/2019   Lipid Panel    Component Value Date/Time   CHOL 128 11/05/2019 0552   TRIG 82 11/05/2019 0552   HDL 37 (L) 11/05/2019 0552   CHOLHDL 3.5 11/05/2019 0552   VLDL 16 11/05/2019 0552   LDLCALC 75 11/05/2019 0552   HgbA1C  Lab Results  Component Value Date   HGBA1C 6.0 (H) 11/05/2019   Urinalysis No results found for: COLORURINE, APPEARANCEUR, LABSPEC, PHURINE, GLUCOSEU, HGBUR, BILIRUBINUR, KETONESUR, PROTEINUR, UROBILINOGEN, NITRITE, LEUKOCYTESUR Urine Drug Screen     Component Value Date/Time   LABOPIA NONE DETECTED 11/04/2019 1927   COCAINSCRNUR NONE DETECTED 11/04/2019 1927   LABBENZ NONE DETECTED 11/04/2019 1927   AMPHETMU NONE DETECTED 11/04/2019 1927   THCU NONE DETECTED 11/04/2019 1927   LABBARB NONE DETECTED 11/04/2019 1927    Alcohol Level No results found for: [place] STUDIES CT Code Stroke CTA Head W/WO contrast  Result Date: 11/04/2019 CLINICAL DATA:  Visual field cut. EXAM: CT ANGIOGRAPHY HEAD AND NECK CT PERFUSION BRAIN TECHNIQUE: Multidetector CT imaging of the head and  neck was performed using the standard protocol during bolus administration of intravenous contrast. Multiplanar CT image reconstructions and MIPs were obtained to evaluate the vascular anatomy. Carotid stenosis measurements (when applicable) are obtained utilizing NASCET criteria, using the distal internal carotid diameter as the denominator. Multiphase CT imaging of the brain was performed following IV bolus contrast injection. Subsequent parametric perfusion maps were calculated using RAPID software. CONTRAST:  144m OMNIPAQUE IOHEXOL 350 MG/ML SOLN COMPARISON:  None. FINDINGS: CTA NECK FINDINGS SKELETON: There is no bony spinal canal stenosis. No lytic or blastic lesion. OTHER NECK: Normal pharynx, larynx and major salivary glands. No cervical lymphadenopathy. Unremarkable thyroid gland. UPPER CHEST: No pneumothorax or pleural effusion. No nodules or masses. AORTIC ARCH: There is no calcific atherosclerosis of the aortic arch. There is no aneurysm, dissection or hemodynamically significant stenosis of the visualized portion of the aorta. Conventional 3 vessel aortic branching pattern. The visualized proximal subclavian arteries are widely patent. RIGHT CAROTID SYSTEM: Normal without aneurysm, dissection or stenosis. LEFT CAROTID SYSTEM: Normal without aneurysm, dissection or stenosis. VERTEBRAL ARTERIES: Left dominant configuration. Both origins are clearly patent. There is no dissection, occlusion or flow-limiting stenosis to the skull base (V1-V3 segments). CTA HEAD FINDINGS POSTERIOR CIRCULATION: --Vertebral arteries: Normal V4 segments. --Posterior inferior cerebellar arteries (PICA): Patent origins from the vertebral arteries. --Anterior inferior cerebellar arteries (AICA): Patent origins from the basilar artery. --Basilar artery: Normal. --Superior cerebellar arteries: Normal. 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 Normal left. ANTERIOR CIRCULATION:  --Intracranial internal carotid arteries: Normal. --Anterior cerebral arteries (ACA): Normal. Both A1 segments are present. Patent anterior communicating artery (a-comm). --Middle cerebral arteries (MCA): Normal. VENOUS SINUSES: As permitted by contrast timing, patent. ANATOMIC VARIANTS: None Review of the MIP images confirms the above findings. CT Brain Perfusion Findings: ASPECTS: On further review of the earlier noncontrast head CT images, ASPECTS is downgraded to 9 based on the presence hypoattenuation in the right thalamus. CBF (<30%) Volume: 07mPerfusion (Tmax>6.0s) volume: 3174mismatch Volume: 57m20mfarction Location:No core infarct. The area of ischemic penumbra is in the right occipital lobe. IMPRESSION: 1. 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 No other intracranial arterial occlusion or high-grade stenosis. Critical Value/emergent results were called by telephone at the time of interpretation on 11/04/2019 at 4:43 am to provider ER[facility] LINDNov[facility] Centerho verbally acknowledged these results. 2. 31 mL ischemic penumbra in the right occipital lobe without core infarct. Electronically Signed   By: KeviUlyses Jarred.   On: 11/04/2019 04:49   DG Chest 2 View  Result Date: 11/04/2019 CLINICAL DATA:  CVA, left-sided weakness EXAM: CHEST - 2 VIEW COMPARISON:  11/04/2019 chest radiograph. FINDINGS: Stable cardiomediastinal silhouette with moderate  cardiomegaly. No pneumothorax. No pleural effusion. Slightly low lung volumes. No overt pulmonary edema. No acute consolidative airspace disease. IMPRESSION: Stable moderate cardiomegaly without overt pulmonary edema. No active pulmonary disease. Electronically Signed   By: Ilona Sorrel M.D.   On: 11/04/2019 12:39   CT HEAD WO CONTRAST  Result Date: 11/04/2019 CLINICAL DATA:  Cerebral hemorrhage suspected. Abnormal MRI. EXAM: CT HEAD WITHOUT CONTRAST TECHNIQUE: Contiguous axial images were obtained from the base of the skull through the vertex without  intravenous contrast. COMPARISON:  MR head without contrast 11/04/2019 FINDINGS: Brain: A small amount of subarachnoid hemorrhage is confirmed within the right para mesencephalic cistern and sitting just below the tentorium on the right. No significant intraventricular hemorrhage is present. Evolving posteroinferior PCA territory infarct is again noted. The medullary infarct is below the resolution of this study. No other new infarcts are present. Basal ganglia are intact. The ventricles are of normal size. No other significant extraaxial fluid collection is present. Vascular: No hyperdense vessel or unexpected calcification. Skull: Calvarium is intact. No focal lytic or blastic lesions are present. No significant extracranial soft tissue lesion is present. Sinuses/Orbits: Scattered mucosal thickening is again seen throughout the paranasal sinuses. The mastoid air cells are clear. The globes and orbits are within normal limits. IMPRESSION: 1. Small amount of subarachnoid hemorrhage within the right para mesencephalic cistern and sitting just below the tentorium on the right. 2. Evolving posteroinferior PCA territory infarct. 3. The medullary infarct is below the resolution of this study. Electronically Signed   By: San Morelle M.D.   On: 11/04/2019 16:34   CT Code Stroke CTA Neck W/WO contrast  Result Date: 11/04/2019 CLINICAL DATA:  Visual field cut. EXAM: CT ANGIOGRAPHY HEAD AND NECK CT PERFUSION BRAIN TECHNIQUE: Multidetector CT imaging of the head and neck was performed using the standard protocol during bolus administration of intravenous contrast. Multiplanar CT image reconstructions and MIPs were obtained to evaluate the vascular anatomy. Carotid stenosis measurements (when applicable) are obtained utilizing NASCET criteria, using the distal internal carotid diameter as the denominator. Multiphase CT imaging of the brain was performed following IV bolus contrast injection. Subsequent parametric  perfusion maps were calculated using RAPID software. CONTRAST:  172m OMNIPAQUE IOHEXOL 350 MG/ML SOLN COMPARISON:  None. FINDINGS: CTA NECK FINDINGS SKELETON: There is no bony spinal canal stenosis. No lytic or blastic lesion. OTHER NECK: Normal pharynx, larynx and major salivary glands. No cervical lymphadenopathy. Unremarkable thyroid gland. UPPER CHEST: No pneumothorax or pleural effusion. No nodules or masses. AORTIC ARCH: There is no calcific atherosclerosis of the aortic arch. There is no aneurysm, dissection or hemodynamically significant stenosis of the visualized portion of the aorta. Conventional 3 vessel aortic branching pattern. The visualized proximal subclavian arteries are widely patent. RIGHT CAROTID SYSTEM: Normal without aneurysm, dissection or stenosis. LEFT CAROTID SYSTEM: Normal without aneurysm, dissection or stenosis. VERTEBRAL ARTERIES: Left dominant configuration. Both origins are clearly patent. There is no dissection, occlusion or flow-limiting stenosis to the skull base (V1-V3 segments). CTA HEAD FINDINGS POSTERIOR CIRCULATION: --Vertebral arteries: Normal V4 segments. --Posterior inferior cerebellar arteries (PICA): Patent origins from the vertebral arteries. --Anterior inferior cerebellar arteries (AICA): Patent origins from the basilar artery. --Basilar artery: Normal. --Superior cerebellar arteries: Normal. 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 Normal left. ANTERIOR CIRCULATION: --Intracranial internal carotid arteries: Normal. --Anterior cerebral arteries (ACA): Normal. Both A1 segments are present. Patent anterior communicating artery (a-comm). --Middle cerebral arteries (MCA): Normal. VENOUS SINUSES: As permitted by contrast  timing, patent. ANATOMIC VARIANTS: None Review of the MIP images confirms the above findings. CT Brain Perfusion Findings: ASPECTS: On further review of the earlier noncontrast head CT images, ASPECTS is  downgraded to 9 based on the presence hypoattenuation in the right thalamus. CBF (<30%) Volume: 38m Perfusion (Tmax>6.0s) volume: 324mMismatch Volume: 3135mnfarction Location:No core infarct. The area of ischemic penumbra is in the right occipital lobe. IMPRESSION: 1. 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 No other intracranial arterial occlusion or high-grade stenosis. Critical Value/emergent results were called by telephone at the time of interpretation on 11/04/2019 at 4:43 am to provider ERIC LINAdvanced [facility] Center Pscwho verbally acknowledged these results. 2. 31 mL ischemic penumbra in the right occipital lobe without core infarct. Electronically Signed   By: KevUlyses JarredD.   On: 11/04/2019 04:49   MR BRAIN WO CONTRAST  Result Date: 11/05/2019 CLINICAL DATA:  Progressive left-sided weakness. New right-sided weakness. EXAM: MRI HEAD WITHOUT CONTRAST TECHNIQUE: Multiplanar, multiecho pulse sequences of the brain and surrounding structures were obtained without intravenous contrast. COMPARISON:  CT head without contrast 11/04/2019. MR head without contrast 11/04/2019 FINDINGS: Brain: Diffusion changes have progressed significantly. Right PCA territory infarct is now more confluent involving the medial and inferior right occipital lobe as well as significant portions of the right thalamus. The right posterior medullary infarct is again seen. A new focus of restricted diffusion is present in the posterior right midbrain. Additional new foci acute nonhemorrhagic infarct are present in the high posterior left occipital lobe. T2 signal changes are again noted with these areas of acute infarction. No new hemorrhage or mass lesion is evident. Previously seen hemorrhage in the right perimesencephalic cistern has decreased. Vascular: Flow is present in the major intracranial arteries. Skull and upper cervical spine: The craniocervical junction is normal. Upper cervical spine is within normal limits. Marrow signal  is unremarkable. Sinuses/Orbits: Mild mucosal thickening is stable. Conjugate rightward gaze is noted. Globes and orbits are within normal limits otherwise. IMPRESSION: 1. New cute/subacute nonhemorrhagic infarcts involving the posterior right midbrain and high posterior left occipital lobe. 2. The posteromedial right PCA infarct has become more confluent. No hemorrhagic conversion. 3. Stable right posterior medullary infarct. 4. Small focus of subarachnoid hemorrhage in the right para mesencephalic cistern is improving. No new hemorrhage is present. Electronically Signed   By: ChrSan MorelleD.   On: 11/05/2019 15:34   MR BRAIN WO CONTRAST  Addendum Date: 11/04/2019   ADDENDUM REPORT: 11/04/2019 16:03 ADDENDUM: A punctate nonhemorrhagic infarct is present in the posterior right medulla. This may explain the patient's extremity numbness. Recommend CT of the head to assess possible hemorrhage. Critical Value/emergent results were called by telephone at the time of interpretation on 11/04/2019 at 3: 55 pm to provider [facility]INN[facility] Hospitalwho verbally acknowledged these results. Electronically Signed   By: ChrSan MorelleD.   On: 11/04/2019 16:03   Result Date: 11/04/2019 CLINICAL DATA:  Stroke. Visual field cut. Status post attempted reperfusion the right P2 segment. TICI 2A EXAM: MRI HEAD WITHOUT CONTRAST TECHNIQUE: Multiplanar, multiecho pulse sequences of the brain and surrounding structures were obtained without intravenous contrast. COMPARISON:  CT HEAD WITHOUT CONTRAST CTA HEAD AND NECK, AND CT PERFUSION 11/04/2019 FINDINGS: Brain: Diffusion-weighted images demonstrate restricted diffusion within the right thalamus and along the medial and inferior posterior right temporal and occipital lobe. Focal signal is present within the right para mesencephalic cistern suggesting a small subarachnoid hemorrhage. The areas of infarction correspond to  the area of ischemia in the right PCA territory on the CT  perfusion study and are consistent with incomplete reperfusion of the right PCA branch. Vascular: Flow is present in the major intracranial arteries. Skull and upper cervical spine: Skull base is unremarkable. Sinuses/Orbits: Mild diffuse mucosal thickening present throughout paranasal sinuses. No significant fluid levels are present. The mastoid air cells are clear. The globes and orbits are within normal limits. IMPRESSION: 1. Acute/subacute nonhemorrhagic infarct involving the right thalamus, right posterior temporal and occipital lobe, and right para mesencephalic cistern. 2. Decreased reperfusion of the right PCA branch. 3. Mild diffuse sinus disease. Electronically Signed: By: San Morelle M.D. On: 11/04/2019 15:33   MR CERVICAL SPINE WO CONTRAST  Result Date: 11/08/2019 CLINICAL DATA:  Stroke follow-up. EXAM: MRI CERVICAL SPINE WITHOUT CONTRAST TECHNIQUE: Multiplanar, multisequence MR imaging of the cervical spine was performed. No intravenous contrast was administered. COMPARISON:  Neck CTA 11/04/2019. Head MRI 11/05/2019. FINDINGS: The study is moderately motion degraded. Alignment: Cervical spine straightening. No significant listhesis. Vertebrae: No fracture, suspicious osseous lesion, or significant marrow edema. Cord: Abnormal T2 hyperintensity and diffusion weighted signal in the right medulla extending into the spinal cord to the C1-2 level corresponding to the infarct on the recent prior brain MRI. No definite signal abnormality identified in the more distal cervical spinal cord although motion artifact limits assessment. Posterior Fossa, vertebral arteries, paraspinal tissues: Partially visualized diffusion and T2 signal abnormality in the midbrain and right greater than left PCA territory is corresponding to the infarcts on the prior brain MRI. Preserved vertebral artery flow voids. Disc levels: C2-3: Negative. C3-4: Broad-based posterior disc osteophyte complex results in mild  bilateral neural foraminal stenosis and mild spinal stenosis. C4-5: Mild disc bulging, asymmetric left uncovertebral spurring, and moderate to severe left facet arthrosis result in severe left neural foraminal stenosis without significant spinal stenosis. C5-6: Broad-based posterior disc osteophyte complex results in mild spinal stenosis and severe bilateral neural foraminal stenosis. C6-7: Broad-based posterior disc osteophyte complex results in severe left greater than right neural foraminal stenosis without significant spinal stenosis. C7-T1: Negative. IMPRESSION: 1. Motion degraded examination. 2. Known acute to subacute infarcts involving the right greater than left PCA territories and brainstem including the medulla and upper cervical spinal cord. 3. Cervical spondylosis with severe neural foraminal stenosis at C4-5, C5-6, and C6-7. 4. Mild spinal stenosis at C3-4 and C5-6. Electronically Signed   By: Logan Bores M.D.   On: 11/08/2019 21:46   IR CT Head Ltd  Result Date: 11/06/2019 INDICATION: 59 year old male with past medical history significant for hypertension, tobacco and alcohol abuse. He presented to the ED with sudden onset of dysarthria, left hemiplegia and facial droop started at 2 p.m. on 11/03/2018 which is spontaneously resolved but recurred at 6:30 p.m. His baseline modified Rankin scale was 0, NIHSS at presentation was 14. No intravenous tPA administered as he was outside of window. Head CT showed no evidence of large acute infarct or hemorrhage. CT angiogram of the head and neck showed a proximal right P2/PCA occlusion. CT EXAM: Ultrasound-guided vascular access Diagnostic cerebral angiogram Mechanical thrombectomy Flat panel head CT. COMPARISON:  CT/CT angiogram of the head and neck 11/04/2019. MEDICATIONS: None. ANESTHESIA/SEDATION: General anesthesia performed. CONTRAST:  96 mL of Omnipaque 240 FLUOROSCOPY TIME:  Fluoroscopy Time: 49 minutes 0 seconds (1874 mGy). COMPLICATIONS: None  immediate. TECHNIQUE: Informed written consent was obtained from the patient next of kin after a thorough discussion of the procedural risks, benefits and alternatives. All questions  were addressed. Maximal Sterile Barrier Technique was utilized including caps, mask, sterile gowns, sterile gloves, sterile drape, hand hygiene and skin antiseptic. A timeout was performed prior to the initiation of the procedure. Real-time ultrasound guidance was utilized for vascular access including the acquisition of a permanent ultrasound image documenting patency of the accessed vessel. Using the modified Seldinger technique and a micropuncture kit, access was gained to the distal right radial artery at the anatomical snuffbox and a 7 French sheath was placed. Then, a right radial artery roadmap was obtained via sheath side port. Normal brachial artery branching pattern seen. No significant anatomical variation. The right radial artery caliber is adequate for vascular access. At under fluoroscopy, a 5 French vert catheter was navigated over a 0.035 inch Terumo Glidewire into the right subclavian artery. The right subclavian roadmap was obtained and the the catheter was exchanged over the wire for a 7 [place] Rist catheter which was placed in the distal V2 segment of the right vertebral artery. Frontal and lateral angiograms of the head were then obtained. FINDINGS: There is no occlusion of the proximal P2 segment of the right posterior cerebral artery. PROCEDURE: Under biplane roadmap, a [place] catheter was navigated over a phenom 21 microcatheter and a synchro support microguidewire into the basilar artery. The microcatheter was then navigated over the wire into the right P3/PCA. Then, a 4 x 40 mm solitaire stent retriever was deployed spanning the P1-P2 segment. The device was allowed to intercalated with the clot for 4 minutes. The microcatheter was removed. The [place] was advanced to the level of the P1 segment and  connected to a penumbra aspiration pump. The thrombectomy device and Sofia catheter were removed under constant aspiration. 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 In a similar fashion, another stent retriever pass was performed with the solitaire. Follow-up she angiograms showed persistent occlusion of the right PCA. Under biplane roadmap, a [place] catheter was navigated over a phenom 21 microcatheter and a synchro support microguidewire into the basilar artery. The microcatheter was then navigated over the wire into the right P3/PCA. Then, a 5 x 37 mm embotrap stent retriever was deployed spanning the P1-P2 segment. The device was allowed to intercalated with the clot for 4 minutes. The microcatheter was removed. The [place] was advanced to the level of the P1 segment and connected to a penumbra aspiration pump. The thrombectomy device and Sofia catheter were removed under constant aspiration. 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 Flat panel CT of the head was obtained and post processed in a separate workstation with concurrent attending physician supervision. Selected images were sent to PACS. No evidence of hemorrhagic complication seen. On the biplane roadmap, a 3 max aspiration catheter was navigated over a synchro support microguidewire to the level of occlusion at the right P2 segment. The 3 max was connected to a separation on in contain is aspiration was performed for 5 minutes. The 3 max was subsequently retracted under constant aspiration. A follow-up right vertebral artery angiogram showed occlusion al at the level of the distal P2 segment (P2P). Under biplane roadmap, a [place] catheter was navigated over a phenom 21 microcatheter and a synchro support microguidewire into the basilar artery. The microcatheter was then navigated over the wire  into the right P3/PCA. Then, a 5 x 37 mm embotrap stent retriever was deployed spanning the P1-P2 segment. The  device was allowed to intercalated with the clot for 4 minutes. The microcatheter was removed. The [place] was advanced to the level of the P1 segment and connected to a penumbra aspiration pump. The thrombectomy device and Sofia catheter were removed under constant aspiration. Follow-up right artery angiogram showed to occlusion at the level of the distal P2 segment. Flat panel CT of the head was obtained and post processed in a separate workstation with concurrent attending physician supervision. Selected images were sent to PACS. No evidence of hemorrhagic complication seen. The catheter was subsequently withdrawn. The radial sheath was removed and inflatable band was placed over the access site. 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 PLAN: Transferred to ICU for continued monitoring. Electronically Signed   By: Pedro Earls M.D.   On: 11/06/2019 15:42   IR CT Head Ltd  Result Date: 11/06/2019 INDICATION: 59 year old male with past medical history significant for hypertension, tobacco and alcohol abuse. He presented to the ED with sudden onset of dysarthria, left hemiplegia and facial droop started at 2 p.m. on 11/03/2018 which is spontaneously resolved but recurred at 6:30 p.m. His baseline modified Rankin scale was 0, NIHSS at presentation was 14. No intravenous tPA administered as he was outside of window. Head CT showed no evidence of large acute infarct or hemorrhage. CT angiogram of the head and neck showed a proximal right P2/PCA occlusion. CT EXAM: Ultrasound-guided vascular access Diagnostic cerebral angiogram Mechanical thrombectomy Flat panel head CT. COMPARISON:  CT/CT angiogram of the head and neck 11/04/2019. MEDICATIONS: None.  ANESTHESIA/SEDATION: General anesthesia performed. CONTRAST:  96 mL of Omnipaque 240 FLUOROSCOPY TIME:  Fluoroscopy Time: 49 minutes 0 seconds (1874 mGy). COMPLICATIONS: None immediate. TECHNIQUE: Informed written consent was obtained from the patient next of kin after a thorough discussion of the procedural risks, benefits and alternatives. All questions were addressed. Maximal Sterile Barrier Technique was utilized including caps, mask, sterile gowns, sterile gloves, sterile drape, hand hygiene and skin antiseptic. A timeout was performed prior to the initiation of the procedure. Real-time ultrasound guidance was utilized for vascular access including the acquisition of a permanent ultrasound image documenting patency of the accessed vessel. Using the modified Seldinger technique and a micropuncture kit, access was gained to the distal right radial artery at the anatomical snuffbox and a 7 French sheath was placed. Then, a right radial artery roadmap was obtained via sheath side port. Normal brachial artery branching pattern seen. No significant anatomical variation. The right radial artery caliber is adequate for vascular access. At under fluoroscopy, a 5 French vert catheter was navigated over a 0.035 inch Terumo Glidewire into the right subclavian artery. The right subclavian roadmap was obtained and the the catheter was exchanged over the wire for a 7 [place] Rist catheter which was placed in the distal V2 segment of the right vertebral artery. Frontal and lateral angiograms of the head were then obtained. FINDINGS: There is no occlusion of the proximal P2 segment of the right posterior cerebral artery. PROCEDURE: Under biplane roadmap, a [place] catheter was navigated over a phenom 21 microcatheter and a synchro support microguidewire into the basilar artery. The microcatheter was then navigated over the wire into the right P3/PCA. Then, a 4 x 40 mm solitaire stent retriever was deployed spanning the  P1-P2 segment. The device was allowed to intercalated with the clot for 4 minutes. The microcatheter was removed. The [place]  was advanced to the level of the P1 segment and connected to a penumbra aspiration pump. The thrombectomy device and Sofia catheter were removed under constant aspiration. 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 In a similar fashion, another stent retriever pass was performed with the solitaire. Follow-up she angiograms showed persistent occlusion of the right PCA. Under biplane roadmap, a [place] catheter was navigated over a phenom 21 microcatheter and a synchro support microguidewire into the basilar artery. The microcatheter was then navigated over the wire into the right P3/PCA. Then, a 5 x 37 mm embotrap stent retriever was deployed spanning the P1-P2 segment. The device was allowed to intercalated with the clot for 4 minutes. The microcatheter was removed. The [place] was advanced to the level of the P1 segment and connected to a penumbra aspiration pump. The thrombectomy device and Sofia catheter were removed under constant aspiration. 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 Flat panel CT of the head was obtained and post processed in a separate workstation with concurrent attending physician supervision. Selected images were sent to PACS. No evidence of hemorrhagic complication seen. On the biplane roadmap, a 3 max aspiration catheter was navigated over a synchro support microguidewire to the level of occlusion at the right P2 segment. The 3 max was connected to a separation on in contain is aspiration was performed for 5 minutes. The 3 max was subsequently retracted under constant aspiration. A follow-up right vertebral artery angiogram showed occlusion al at the level of the distal P2 segment (P2P). Under biplane roadmap, a [place] catheter was navigated over a phenom 21 microcatheter and a synchro support microguidewire into the basilar artery. The microcatheter was then navigated over the wire into the right P3/PCA. Then, a 5 x 37 mm embotrap stent retriever was deployed spanning the P1-P2 segment. The device was allowed to intercalated with the clot for 4 minutes. The microcatheter was removed. The [place] was advanced to the level of the P1 segment and connected to a penumbra aspiration pump. The thrombectomy device and Sofia catheter were removed under constant aspiration. Follow-up right artery angiogram showed to occlusion at the level of the distal P2 segment. Flat panel CT of the head was obtained and post processed in a separate workstation with concurrent attending physician supervision. Selected images were sent to PACS. No evidence of hemorrhagic complication seen. The catheter was subsequently withdrawn. The radial sheath was removed and inflatable band was placed over the access site. 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 PLAN: Transferred to ICU for continued monitoring. Electronically Signed   By: Pedro Earls M.D.   On: 11/06/2019 15:42   IR US Guide Vasc Access Right  Result Date: 11/06/2019 INDICATION: 59 year old male with past medical history significant for hypertension, tobacco and alcohol abuse. He presented to the ED with sudden onset of dysarthria, left hemiplegia and facial droop started at 2 p.m. on 11/03/2018 which is spontaneously resolved but recurred at 6:30 p.m. His baseline modified Rankin scale was 0, NIHSS at presentation was 14. No intravenous tPA administered as he was outside of window. Head CT showed no evidence of large acute infarct or hemorrhage. CT angiogram of the head and neck showed a proximal right P2/PCA occlusion. CT EXAM:  Ultrasound-guided vascular access Diagnostic cerebral angiogram Mechanical thrombectomy  Flat panel head CT. COMPARISON:  CT/CT angiogram of the head and neck 11/04/2019. MEDICATIONS: None. ANESTHESIA/SEDATION: General anesthesia performed. CONTRAST:  96 mL of Omnipaque 240 FLUOROSCOPY TIME:  Fluoroscopy Time: 49 minutes 0 seconds (1874 mGy). COMPLICATIONS: None immediate. TECHNIQUE: Informed written consent was obtained from the patient next of kin after a thorough discussion of the procedural risks, benefits and alternatives. All questions were addressed. Maximal Sterile Barrier Technique was utilized including caps, mask, sterile gowns, sterile gloves, sterile drape, hand hygiene and skin antiseptic. A timeout was performed prior to the initiation of the procedure. Real-time ultrasound guidance was utilized for vascular access including the acquisition of a permanent ultrasound image documenting patency of the accessed vessel. Using the modified Seldinger technique and a micropuncture kit, access was gained to the distal right radial artery at the anatomical snuffbox and a 7 French sheath was placed. Then, a right radial artery roadmap was obtained via sheath side port. Normal brachial artery branching pattern seen. No significant anatomical variation. The right radial artery caliber is adequate for vascular access. At under fluoroscopy, a 5 French vert catheter was navigated over a 0.035 inch Terumo Glidewire into the right subclavian artery. The right subclavian roadmap was obtained and the the catheter was exchanged over the wire for a 7 [place] Rist catheter which was placed in the distal V2 segment of the right vertebral artery. Frontal and lateral angiograms of the head were then obtained. FINDINGS: There is no occlusion of the proximal P2 segment of the right posterior cerebral artery. PROCEDURE: Under biplane roadmap, a [place] catheter was navigated over a phenom 21 microcatheter and a synchro  support microguidewire into the basilar artery. The microcatheter was then navigated over the wire into the right P3/PCA. Then, a 4 x 40 mm solitaire stent retriever was deployed spanning the P1-P2 segment. The device was allowed to intercalated with the clot for 4 minutes. The microcatheter was removed. The [place] was advanced to the level of the P1 segment and connected to a penumbra aspiration pump. The thrombectomy device and Sofia catheter were removed under constant aspiration. 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 In a similar fashion, another stent retriever pass was performed with the solitaire. Follow-up she angiograms showed persistent occlusion of the right PCA. Under biplane roadmap, a [place] catheter was navigated over a phenom 21 microcatheter and a synchro support microguidewire into the basilar artery. The microcatheter was then navigated over the wire into the right P3/PCA. Then, a 5 x 37 mm embotrap stent retriever was deployed spanning the P1-P2 segment. The device was allowed to intercalated with the clot for 4 minutes. The microcatheter was removed. The [place] was advanced to the level of the P1 segment and connected to a penumbra aspiration pump. The thrombectomy device and Sofia catheter were removed under constant aspiration. 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 Flat panel CT of the head was obtained and post processed in a separate workstation with concurrent attending physician supervision. Selected images were sent to PACS. No evidence of hemorrhagic complication seen. On the biplane roadmap, a 3 max aspiration catheter was navigated over a synchro support microguidewire to the level of occlusion at the right P2 segment. The 3 max was connected to a separation on in contain is aspiration was performed for 5 minutes. The 3 max was  subsequently retracted under constant aspiration. A follow-up right vertebral  artery angiogram showed occlusion al at the level of the distal P2 segment (P2P). Under biplane roadmap, a [place] catheter was navigated over a phenom 21 microcatheter and a synchro support microguidewire into the basilar artery. The microcatheter was then navigated over the wire into the right P3/PCA. Then, a 5 x 37 mm embotrap stent retriever was deployed spanning the P1-P2 segment. The device was allowed to intercalated with the clot for 4 minutes. The microcatheter was removed. The [place] was advanced to the level of the P1 segment and connected to a penumbra aspiration pump. The thrombectomy device and Sofia catheter were removed under constant aspiration. Follow-up right artery angiogram showed to occlusion at the level of the distal P2 segment. Flat panel CT of the head was obtained and post processed in a separate workstation with concurrent attending physician supervision. Selected images were sent to PACS. No evidence of hemorrhagic complication seen. The catheter was subsequently withdrawn. The radial sheath was removed and inflatable band was placed over the access site. 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 PLAN: Transferred to ICU for continued monitoring. Electronically Signed   By: Pedro Earls M.D.   On: 11/06/2019 15:42   CT Code Stroke Cerebral Perfusion with contrast  Result Date: 11/04/2019 CLINICAL DATA:  Visual field cut. EXAM: CT ANGIOGRAPHY HEAD AND NECK CT PERFUSION BRAIN TECHNIQUE: Multidetector CT imaging of the head and neck was performed using the standard protocol during bolus administration of intravenous contrast. Multiplanar CT image reconstructions and MIPs were obtained to evaluate the vascular anatomy. Carotid stenosis measurements (when  applicable) are obtained utilizing NASCET criteria, using the distal internal carotid diameter as the denominator. Multiphase CT imaging of the brain was performed following IV bolus contrast injection. Subsequent parametric perfusion maps were calculated using RAPID software. CONTRAST:  176m OMNIPAQUE IOHEXOL 350 MG/ML SOLN COMPARISON:  None. FINDINGS: CTA NECK FINDINGS SKELETON: There is no bony spinal canal stenosis. No lytic or blastic lesion. OTHER NECK: Normal pharynx, larynx and major salivary glands. No cervical lymphadenopathy. Unremarkable thyroid gland. UPPER CHEST: No pneumothorax or pleural effusion. No nodules or masses. AORTIC ARCH: There is no calcific atherosclerosis of the aortic arch. There is no aneurysm, dissection or hemodynamically significant stenosis of the visualized portion of the aorta. Conventional 3 vessel aortic branching pattern. The visualized proximal subclavian arteries are widely patent. RIGHT CAROTID SYSTEM: Normal without aneurysm, dissection or stenosis. LEFT CAROTID SYSTEM: Normal without aneurysm, dissection or stenosis. VERTEBRAL ARTERIES: Left dominant configuration. Both origins are clearly patent. There is no dissection, occlusion or flow-limiting stenosis to the skull base (V1-V3 segments). CTA HEAD FINDINGS POSTERIOR CIRCULATION: --Vertebral arteries: Normal V4 segments. --Posterior inferior cerebellar arteries (PICA): Patent origins from the vertebral arteries. --Anterior inferior cerebellar arteries (AICA): Patent origins from the basilar artery. --Basilar artery: Normal. --Superior cerebellar arteries: Normal. 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 Normal left. ANTERIOR CIRCULATION: --Intracranial internal carotid arteries: Normal. --Anterior cerebral arteries (ACA): Normal. Both A1 segments are present. Patent anterior communicating artery (a-comm). --Middle cerebral arteries (MCA): Normal. VENOUS SINUSES: As permitted  by contrast timing, patent. ANATOMIC VARIANTS: None Review of the MIP images confirms the above findings. CT Brain Perfusion Findings: ASPECTS: On further review of the earlier noncontrast head CT images, ASPECTS is downgraded to 9 based on the presence hypoattenuation in the right thalamus. CBF (<30%) Volume: 028mPerfusion (Tmax>6.0s) volume:  67m Mismatch Volume: 336mInfarction Location:No core infarct. The area of ischemic penumbra is in the right occipital lobe. IMPRESSION: 1. 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 No other intracranial arterial occlusion or high-grade stenosis. Critical Value/emergent results were called by telephone at the time of interpretation on 11/04/2019 at 4:43 am to provider ERIC LIThe [facility] who verbally acknowledged these results. 2. 31 mL ischemic penumbra in the right occipital lobe without core infarct. Electronically Signed   By: KeUlyses Jarred.D.   On: 11/04/2019 04:49   DG Swallowing Func-Speech Pathology  Result Date: 11/08/2019 Objective Swallowing Evaluation: Type of Study: MBS-Modified Barium Swallow Study  Patient Details Name: Nicholas CarreonRN: [id number] of Birth: 1004/05/1962oday's Date: 11/08/2019 Time: SLP Start Time (ACUTE ONLY): 1410 -SLP Stop Time (ACUTE ONLY): 1430 SLP Time Calculation (min) (ACUTE ONLY): 20 min Past Medical History: Past Medical History: Diagnosis Date . Hypertension  Past Surgical History: Past Surgical History: Procedure Laterality Date . IR CT HEAD LTD  11/04/2019 . IR CT HEAD LTD  11/04/2019 . IR PERCUTANEOUS ART THROMBECTOMY/INFUSION INTRACRANIAL INC DIAG ANGIO  11/04/2019 . IR [place] VASC ACCESS RIGHT  11/04/2019 . RADIOLOGY WITH ANESTHESIA N/A 11/04/2019  Procedure: IR WITH ANESTHESIA;  Surgeon: DeLuanne BrasMD;  [facility] Service: Radiology;  Laterality: N/A; HPI: Pt is a [age].o. male presenting from home with acute onset of dysarthria, left hemiplegia and left facial droop. 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 Pt s/p cerebral mechanical thrombectomy of right PCA P2 occlusion and revascularization. Pt developed progressive left-sided weakness and new right-sided weakness on 4/18 and repeat MRI showed new acute/subacute nonhemorrhagic infarcts involving the posterior right midbrain and high posterior left occipital lobe.  Subjective: alert, cooperative Assessment / Plan / Recommendation [facility] IP CLINICAL IMPRESSIONS 11/08/2019 Clinical Impression Patient presents with mild oropharyngeal dysphagia secondary to a timing deficit. Oral phase is remarkable for trace lingual residue, but clears with multiple swallows and/or a liquid wash. Patient's hyoid movement/mobility was noted to be reduced. Penetration (PAS 2) occured during the swallow with thin liquids secondary to a timing deficit as the pt was challeneged with large consecutive sips. No aspiration was observed. Given the pt's current mentation and fluctuating alert state, recommend starting Dys 3 diet and thin liquids. Enusre the pt has full supervision while eating/drinking and is alert/awake before offering POs. Will upgrade diet as clinically appropriate, and as pt's mentation/alert state improve. SLP Visit Diagnosis Dysphagia, oropharyngeal phase (R13.12) Attention and concentration deficit following -- Frontal lobe and executive function deficit following -- Impact on safety and function Mild aspiration risk   CH[facility] IP TREATMENT RECOMMENDATION 11/08/2019 Treatment Recommendations Therapy as outlined in treatment plan below   Prognosis 11/08/2019 Prognosis for Safe Diet Advancement Good Barriers to Reach Goals Cognitive deficits Barriers/Prognosis Comment -- [facility] IP DIET RECOMMENDATION 11/08/2019 SLP Diet Recommendations Dysphagia 3 (Mech soft) solids;Thin liquid Liquid Administration via Cup;Straw  Medication Administration Whole meds with liquid Compensations Slow rate;Small sips/bites Postural Changes Seated upright at 90 degrees   CH[facility] IP OTHER RECOMMENDATIONS 11/08/2019 Recommended Consults -- Oral Care Recommendations Oral care BID Other Recommendations --   [facility] IP FOLLOW UP RECOMMENDATIONS 11/08/2019 Follow up Recommendations Inpatient Rehab   [facility] IP FREQUENCY AND DURATION 11/08/2019 Speech Therapy Frequency (ACUTE ONLY) min 2x/week Treatment Duration 2 weeks      [facility] IP ORAL PHASE  11/08/2019 Oral Phase Impaired Oral - Pudding Teaspoon -- Oral - Pudding Cup -- Oral - Honey Teaspoon -- Oral - Honey Cup -- Oral - Nectar Teaspoon -- Oral - Nectar Cup -- Oral - Nectar Straw -- Oral - Thin Teaspoon -- Oral - Thin Cup Lingual/palatal residue Oral - Thin Straw Lingual/palatal residue Oral - Puree Lingual/palatal residue Oral - Mech Soft -- Oral - Regular Lingual/palatal residue Oral - Multi-Consistency -- Oral - Pill WFL Oral Phase - Comment --  [facility] IP PHARYNGEAL PHASE 11/08/2019 Pharyngeal Phase Impaired Pharyngeal- Pudding Teaspoon -- Pharyngeal -- Pharyngeal- Pudding Cup -- Pharyngeal -- Pharyngeal- Honey Teaspoon -- Pharyngeal -- Pharyngeal- Honey Cup -- Pharyngeal -- Pharyngeal- Nectar Teaspoon -- Pharyngeal -- Pharyngeal- Nectar Cup -- Pharyngeal -- Pharyngeal- Nectar Straw -- Pharyngeal -- Pharyngeal- Thin Teaspoon -- Pharyngeal -- Pharyngeal- Thin Cup Penetration/Aspiration during swallow;Reduced laryngeal elevation;Reduced anterior laryngeal mobility Pharyngeal Material enters airway, remains ABOVE vocal cords then ejected out Pharyngeal- Thin Straw Penetration/Aspiration during swallow;Reduced anterior laryngeal mobility;Reduced laryngeal elevation Pharyngeal Material enters airway, remains ABOVE vocal cords then ejected out Pharyngeal- Puree Reduced anterior laryngeal mobility;Reduced laryngeal elevation Pharyngeal -- Pharyngeal- Mechanical Soft -- Pharyngeal -- Pharyngeal- Regular Reduced anterior  laryngeal mobility;Reduced laryngeal elevation Pharyngeal -- Pharyngeal- Multi-consistency -- Pharyngeal -- Pharyngeal- Pill Reduced anterior laryngeal mobility;Reduced laryngeal elevation Pharyngeal -- Pharyngeal Comment --  [facility] IP CERVICAL ESOPHAGEAL PHASE 11/08/2019 Cervical Esophageal Phase WFL Pudding Teaspoon -- Pudding Cup -- Honey Teaspoon -- Honey Cup -- Nectar Teaspoon -- Nectar Cup -- Nectar Straw -- Thin Teaspoon -- Thin Cup -- Thin Straw -- Puree -- Mechanical Soft -- Regular -- Multi-consistency -- Pill -- Cervical Esophageal Comment -- Osie Bond., M.A. [facility] Pager [phone] Office [phone] 11/08/2019, 3:08 PM              ECHOCARDIOGRAM COMPLETE  Result Date: 11/05/2019    ECHOCARDIOGRAM REPORT   Patient Name:   Nicholas Estes Date of Exam: 11/05/2019 Medical Rec #:  [id number]     Height: Accession #:    [id number]    Weight: Date of Birth:  1961/06/05    BSA: Patient Age:    28 years      BP:           135/87 mmHg Patient Gender: M             HR:           64 bpm. Exam Location:  Inpatient Procedure: 2D Echo, Cardiac Doppler and Color Doppler Indications:    Stroke  History:        Patient has no prior history of Echocardiogram examinations.  Sonographer:    Clayton Lefort RDCS (AE) Referring Phys: [id number] ERIC LINDZEN  Sonographer Comments: Patient is morbidly obese. Image acquisition challenging due to respiratory motion and Image acquisition challenging due to patient body habitus. IMPRESSIONS  1. Left ventricular ejection fraction, by estimation, is 55 to 60%. The left ventricle has normal function. The left ventricle has no regional wall motion abnormalities. There is moderate asymmetric left ventricular hypertrophy of the basal-septal segment. Left ventricular diastolic parameters are indeterminate.  2. Right ventricle is poorly visualized but appears grossly normal size and mildly reduced systolic function. Tricuspid regurgitation signal is inadequate for  assessing PA pressure.  3. Left atrial size was mildly dilated.  4. Right atrial size was mildly dilated.  5. The mitral valve is normal in structure. No evidence of mitral valve regurgitation.  6. The aortic valve was not well  visualized. Aortic valve regurgitation is not visualized. Mild aortic valve sclerosis is present, with no evidence of aortic valve stenosis.  7. Aortic dilatation noted. 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  8. The inferior vena cava is dilated in size with <50% respiratory variability, suggesting right atrial pressure of 15 mmHg. FINDINGS  Left Ventricle: Left ventricular ejection fraction, by estimation, is 55 to 60%. The left ventricle has normal function. The left ventricle has no regional wall motion abnormalities. The left ventricular internal cavity size was normal in size. There is  moderate asymmetric left ventricular hypertrophy of the basal-septal segment. Left ventricular diastolic parameters are indeterminate. Right Ventricle: The right ventricular size is normal. Right vetricular wall thickness was not assessed. Right ventricular systolic function is mildly reduced. Tricuspid regurgitation signal is inadequate for assessing PA pressure. Left Atrium: Left atrial size was mildly dilated. Right Atrium: Right atrial size was mildly dilated. Pericardium: There is no evidence of pericardial effusion. Mitral Valve: The mitral valve is normal in structure. No evidence of mitral valve regurgitation. MV peak gradient, 3.9 mmHg. The mean mitral valve gradient is 2.0 mmHg. Tricuspid Valve: The tricuspid valve is grossly normal. Tricuspid valve regurgitation is not demonstrated. Aortic Valve: The aortic valve was not well visualized. Aortic valve regurgitation is not visualized. Mild aortic valve sclerosis is present, with no evidence of aortic valve stenosis. Aortic valve mean gradient measures 3.4 mmHg. Aortic valve peak gradient measures 6.2 mmHg. Aortic valve  area, by VTI measures 2.44 cm. Pulmonic Valve: The pulmonic valve was not well visualized. Pulmonic valve regurgitation is not visualized. Aorta: Aortic dilatation noted. 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 Venous: The inferior vena cava is dilated in size with less than 50% respiratory variability, suggesting right atrial pressure of 15 mmHg. IAS/Shunts: The interatrial septum was not well visualized.  LEFT VENTRICLE PLAX 2D LVIDd:         4.20 cm LVIDs:         2.90 cm LV PW:         1.20 cm LV IVS:        1.30 cm LVOT diam:     2.10 cm LV SV:         56 LVOT Area:     3.46 cm  RIGHT VENTRICLE            IVC RV Basal diam:  3.40 cm    IVC diam: 2.70 cm RV S prime:     8.27 cm/s TAPSE (M-mode): 2.2 cm LEFT ATRIUM           RIGHT ATRIUM LA diam:      4.10 cm RA Area:     22.90 cm LA Vol (A2C): 79.2 ml RA Volume:   61.80 ml LA Vol (A4C): 83.5 ml  AORTIC VALVE AV Area (Vmax):    2.36 cm AV Area (Vmean):   2.27 cm AV Area (VTI):     2.44 cm AV Vmax:           124.20 cm/s AV Vmean:          85.900 cm/s AV VTI:            0.230 m AV Peak Grad:      6.2 mmHg AV Mean Grad:      3.4 mmHg LVOT Vmax:         84.68 cm/s LVOT Vmean:        56.260 cm/s LVOT  VTI:          0.162 m LVOT/AV VTI ratio: 0.70  AORTA Ao Root diam: 3.90 cm Ao Asc diam:  3.70 cm MITRAL VALVE MV Area (PHT): 2.06 cm  SHUNTS MV Peak grad:  3.9 mmHg  Systemic VTI:  0.16 m MV Mean grad:  2.0 mmHg  Systemic Diam: 2.10 cm MV Vmax:       0.98 m/s MV Vmean:      58.3 cm/s Oswaldo Milian MD Electronically signed by Oswaldo Milian MD Signature Date/Time: 11/05/2019/10:04:00 PM    Final    IR PERCUTANEOUS ART THROMBECTOMY/INFUSION INTRACRANIAL INC DIAG ANGIO  Result Date: 11/06/2019 INDICATION: 59 year old male with past medical history significant for hypertension, tobacco and alcohol abuse. He presented to the ED with sudden onset of dysarthria, left hemiplegia and facial droop started at 2 p.m. on 11/03/2018 which  is spontaneously resolved but recurred at 6:30 p.m. His baseline modified Rankin scale was 0, NIHSS at presentation was 14. No intravenous tPA administered as he was outside of window. Head CT showed no evidence of large acute infarct or hemorrhage. CT angiogram of the head and neck showed a proximal right P2/PCA occlusion. CT EXAM: Ultrasound-guided vascular access Diagnostic cerebral angiogram Mechanical thrombectomy Flat panel head CT. COMPARISON:  CT/CT angiogram of the head and neck 11/04/2019. MEDICATIONS: None. ANESTHESIA/SEDATION: General anesthesia performed. CONTRAST:  96 mL of Omnipaque 240 FLUOROSCOPY TIME:  Fluoroscopy Time: 49 minutes 0 seconds (1874 mGy). COMPLICATIONS: None immediate. TECHNIQUE: Informed written consent was obtained from the patient next of kin after a thorough discussion of the procedural risks, benefits and alternatives. All questions were addressed. Maximal Sterile Barrier Technique was utilized including caps, mask, sterile gowns, sterile gloves, sterile drape, hand hygiene and skin antiseptic. A timeout was performed prior to the initiation of the procedure. Real-time ultrasound guidance was utilized for vascular access including the acquisition of a permanent ultrasound image documenting patency of the accessed vessel. Using the modified Seldinger technique and a micropuncture kit, access was gained to the distal right radial artery at the anatomical snuffbox and a 7 French sheath was placed. Then, a right radial artery roadmap was obtained via sheath side port. Normal brachial artery branching pattern seen. No significant anatomical variation. The right radial artery caliber is adequate for vascular access. At under fluoroscopy, a 5 French vert catheter was navigated over a 0.035 inch Terumo Glidewire into the right subclavian artery. The right subclavian roadmap was obtained and the the catheter was exchanged over the wire for a 7 [place] Rist catheter which was placed in the  distal V2 segment of the right vertebral artery. Frontal and lateral angiograms of the head were then obtained. FINDINGS: There is no occlusion of the proximal P2 segment of the right posterior cerebral artery. PROCEDURE: Under biplane roadmap, a [place] catheter was navigated over a phenom 21 microcatheter and a synchro support microguidewire into the basilar artery. The microcatheter was then navigated over the wire into the right P3/PCA. Then, a 4 x 40 mm solitaire stent retriever was deployed spanning the P1-P2 segment. The device was allowed to intercalated with the clot for 4 minutes. The microcatheter was removed. The [place] was advanced to the level of the P1 segment and connected to a penumbra aspiration pump. The thrombectomy device and Sofia catheter were removed under constant aspiration. 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 In a similar fashion, another stent retriever pass was performed with  the solitaire. Follow-up she angiograms showed persistent occlusion of the right PCA. Under biplane roadmap, a [place] catheter was navigated over a phenom 21 microcatheter and a synchro support microguidewire into the basilar artery. The microcatheter was then navigated over the wire into the right P3/PCA. Then, a 5 x 37 mm embotrap stent retriever was deployed spanning the P1-P2 segment. The device was allowed to intercalated with the clot for 4 minutes. The microcatheter was removed. The [place] was advanced to the level of the P1 segment and connected to a penumbra aspiration pump. The thrombectomy device and Sofia catheter were removed under constant aspiration. 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 Flat panel CT of the head was obtained and post processed in a separate workstation with concurrent attending physician supervision. Selected images were  sent to PACS. No evidence of hemorrhagic complication seen. On the biplane roadmap, a 3 max aspiration catheter was navigated over a synchro support microguidewire to the level of occlusion at the right P2 segment. The 3 max was connected to a separation on in contain is aspiration was performed for 5 minutes. The 3 max was subsequently retracted under constant aspiration. A follow-up right vertebral artery angiogram showed occlusion al at the level of the distal P2 segment (P2P). Under biplane roadmap, a [place] catheter was navigated over a phenom 21 microcatheter and a synchro support microguidewire into the basilar artery. The microcatheter was then navigated over the wire into the right P3/PCA. Then, a 5 x 37 mm embotrap stent retriever was deployed spanning the P1-P2 segment. The device was allowed to intercalated with the clot for 4 minutes. The microcatheter was removed. The [place] was advanced to the level of the P1 segment and connected to a penumbra aspiration pump. The thrombectomy device and Sofia catheter were removed under constant aspiration. Follow-up right artery angiogram showed to occlusion at the level of the distal P2 segment. Flat panel CT of the head was obtained and post processed in a separate workstation with concurrent attending physician supervision. Selected images were sent to PACS. No evidence of hemorrhagic complication seen. The catheter was subsequently withdrawn. The radial sheath was removed and inflatable band was placed over the access site. 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 PLAN: Transferred to ICU for continued monitoring. Electronically Signed   By: Pedro Earls M.D.   On: 11/06/2019 15:42   CT HEAD CODE STROKE WO CONTRAST  Result Date: 11/04/2019 CLINICAL DATA:  Code stroke.  Slurred speech and left-sided  numbness EXAM: CT HEAD WITHOUT CONTRAST TECHNIQUE: Contiguous axial images were obtained from the base of the skull through the vertex without intravenous contrast. COMPARISON:  None. FINDINGS: Brain: There is no mass, hemorrhage or extra-axial collection. The size and configuration of the ventricles and extra-axial CSF spaces are normal. The brain parenchyma is normal, without evidence of acute or chronic infarction. Vascular: No abnormal hyperdensity of the major intracranial arteries or dural venous sinuses. No intracranial atherosclerosis. Skull: The visualized skull base, calvarium and extracranial soft tissues are normal. Sinuses/Orbits: No fluid levels or advanced mucosal thickening of the visualized paranasal sinuses. No mastoid or middle ear effusion. The orbits are normal. ASPECTS [facility] Stroke Program Early CT Score) - Ganglionic level infarction (caudate, lentiform nuclei, internal capsule, insula, M1-M3 cortex): 7 - Supraganglionic infarction (M4-M6 cortex): 3 Total score (0-10 with  10 being normal): 10 IMPRESSION: 1. Normal brain 2. ASPECTS is 10 3. These results were communicated to Dr. Kerney Elbe at 4:16 am on 11/04/2019 by text page via the [facility] messaging system. Electronically Signed   By: Ulyses Jarred M.D.   On: 11/04/2019 04:17   VAS [place] LOWER EXTREMITY VENOUS (DVT)  Result Date: 11/04/2019  Lower Venous DVTStudy Indications: Stroke.  Limitations: Movement. Comparison Study: No prior study on file Performing Technologist: Sharion Dove RVS  Examination Guidelines: A complete evaluation includes B-mode imaging, spectral Doppler, color Doppler, and power Doppler as needed of all accessible portions of each vessel. Bilateral testing is considered an integral part of a complete examination. Limited examinations for reoccurring indications may be performed as noted. The reflux portion of the exam is performed with the patient in reverse Trendelenburg.   +---------+---------------+---------+-----------+----------+--------------+ RIGHT    CompressibilityPhasicitySpontaneityPropertiesThrombus Aging +---------+---------------+---------+-----------+----------+--------------+ CFV      Full           Yes      Yes                                 +---------+---------------+---------+-----------+----------+--------------+ SFJ      Full                                                        +---------+---------------+---------+-----------+----------+--------------+ FV Prox  Full                                                        +---------+---------------+---------+-----------+----------+--------------+ FV Mid   Full                                                        +---------+---------------+---------+-----------+----------+--------------+ FV DistalFull                                                        +---------+---------------+---------+-----------+----------+--------------+ PFV      Full                                                        +---------+---------------+---------+-----------+----------+--------------+ POP      Full           Yes      Yes                                 +---------+---------------+---------+-----------+----------+--------------+ PTV      Full                                                        +---------+---------------+---------+-----------+----------+--------------+  PERO     Full                                                        +---------+---------------+---------+-----------+----------+--------------+   +---------+---------------+---------+-----------+----------+--------------+ LEFT     CompressibilityPhasicitySpontaneityPropertiesThrombus Aging +---------+---------------+---------+-----------+----------+--------------+ CFV      Full           Yes      Yes                                  +---------+---------------+---------+-----------+----------+--------------+ SFJ      Full                                                        +---------+---------------+---------+-----------+----------+--------------+ FV Prox  Full                                                        +---------+---------------+---------+-----------+----------+--------------+ FV Mid   Full                                                        +---------+---------------+---------+-----------+----------+--------------+ FV DistalFull                                                        +---------+---------------+---------+-----------+----------+--------------+ PFV      Full                                                        +---------+---------------+---------+-----------+----------+--------------+ POP      Full           Yes      Yes                                 +---------+---------------+---------+-----------+----------+--------------+ PTV      Full                                                        +---------+---------------+---------+-----------+----------+--------------+ PERO     Full                                                        +---------+---------------+---------+-----------+----------+--------------+  Summary: BILATERAL: - No evidence of deep vein thrombosis seen in the lower extremities, bilaterally.  RIGHT: - A cystic structure is found in the popliteal fossa.  LEFT: - No cystic structure found in the popliteal fossa.  *See table(s) above for measurements and observations. Electronically signed by Monica Martinez MD on 11/04/2019 at 3:12:03 PM.    Final        HISTORY OF PRESENT ILLNESS Farhaan Mabee is an 59 y.o. male presenting to [facility] ER from home with acute onset of dysarthria, left hemiplegia and left facial droop. He states that he got dizzy the day before, but that he then returned back to baseline. He then experienced sudden  onset of left sided numbness and weakness. He waited to call EMS as he thought that his symptoms would improve on their own as they did the day before. EMS was called in the early AM hours the following day after symptoms first noted. Thus, he was out side of treatment window for IV tPA. CTA showed right P2A occlusion and he was taken to [facility] for thrombectomy. Multiple passes performed (2x solitaire, 3x embotrap, !x aspiration) with minimal recanalization to the level of the P2P segment (TICI2A). 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 ASPECTS is 10   CTA H&N - Occlusion of the right PCA at the proximal P2 segment.   CT Perfusion - positive for penumbra  IR unsuccessful right P2 thrombectomy  MRI head 11/04/19 - bilateral PCA scattered infarcts and right medullary infarcts, small right paramesencephalic cistern SAH  CT 9/70/26 - confirmed small right perimesencephalic cistern SAH  MRI 4/19 new posterior R midbrain and high posterior L occipital lobe infarcts. R PCA infarct w/o hemorrhagic transformation. Small  R paramesencephalic cistern SAH improving.    MRI C-spine R>L PCA and brainstem infarcts, including medulla and upper spinal cord.   2D Echo - EF 55-60%. No source of embolus. LA mildly dilated  LE venous Doppler negative for DVT  Lacey Jensen Virus 2 - negative  LDL 75  HgbA1c 6.0  UDS - negative  VTE prophylaxis - Heparin 5000 units sq tid   No antithrombotic prior to admission, now on eliquis for stroke prevention. Continue on discharge.    Therapy recommendations:  CIR   Disposition:  D/c today to [facility] rehab   Neuro worsening with left sided weakness - BP dependent  BP low 4/17-> improved after IVF and albumin  IV bolus 1L  Albumin Q6h x 4  Repeat MRI brain shows new R midbrain infarct and high  posterior L occipital lobe infarcts, confluent right PCA infarct extension and right medullary and high cervical cord infarct extension  MRI C-spine R>L PCA and brainstem infarcts, including medulla and upper spinal cord.   BP stable at goal  BP goal 120-160  on midodrine 17m tid    Atrial Fibrillation w/ Pauses - new diagnosis  EKG captured afib  Persistent afib on tele with a episode of 5 sec pause 4/22 overnight  Rate controlled at this time  Will consider metoprolol if RVR  On eliquis 5 bid   Respiratory distress, improved  4/20 requiring O2 and albuterol nebs.   CXR on 4/17 w/ stable cardiomegaly w/o overt edema.   On chest PT  NT suction PRN  Stable now   Alcohol abuse  Heavy drinker at home as  per son  CIWA protocol for another day  Vitamin B1, multivitamin, folic acid  Seizure precaution   Hypotension  Hx of hypertension  Home BP meds: Amlodipine 10  Current BP meds: none  Was low end and now improved to 120s-140s  BP goal 120-160  On midodrine 5   Tobacco abuse  Current smoker  Smoking cessation counseling provided  Nicotine patch provided  Pt is willing to quit   Dysphagia  Secondary to stroke  Cortrak out by pt 4/22 early am  On dysphagia 3 and thin  encourage and monitor po intake  On gentle hydration  Speech on board  RN Pressure Injury Documentation:     Malnutrition Type:  Nutrition Problem: Inadequate oral intake Etiology: lethargy/confusion   Malnutrition Characteristics:  Signs/Symptoms: meal completion < 50%   Nutrition Interventions:  Interventions: Ensure Enlive (each supplement provides 350kcal and 20 grams of protein)  DISCHARGE EXAM Blood pressure (!) 145/93, pulse 89, temperature 97.7 F (36.5 C), temperature source Oral, resp. rate 19, height _0  (1.803 m), weight 107.3 kg, SpO2 96 %. General - Well nourished, well developed, nodistress Ophthalmologic - fundi not visualized due to  noncooperation.   Cardiovascular - irregularly irregular heart rate and rhythm.   Neuro - awake alert, orientated. Mild dysarthria but understandable, no aphasia. Able to name and repeat.  Left hemianopia. gaze midline, but noted initial right preference but can cross the midline and track examiner,   Left facial droop. Tongue protrusion midline. LUE at least 4+/5 and RUE 3/5 proximal and distal. LLE 4+/5 and RLE 3/5. Sensation subjectively symmetrical but complains of RLE tingling at the thigh (in no acute distress), coordination intact FTN left hand and gait not tested.  Discharge Diet      Diet   DIET DYS 3 Room service appropriate? Yes with Assist; Fluid consistency: Thin   liquids  DISCHARGE PLAN  Disposition:  Transfer to Inpatient Rehab for ongoing PT, OT and ST  Eliquis for secondary stroke prevention   Recommend ongoing stroke risk factor control by Primary Care Physician at time of discharge from inpatient rehabilitation.  Follow-up PCP No primary care provider on file. in 2 weeks following discharge from rehab.  Follow-up in [facility] in 4 weeks following discharge from rehab, office to schedule an appointment.   35 minutes were spent preparing discharge.  Desiree Metzger-Cihelka, ARNP-C, ANVP-BC Pager: [phone]  I have personally obtained history,examined this patient, reviewed notes, independently viewed imaging studies, participated in medical decision making and plan of care.ROS completed by me personally and pertinent positives fully documented  I have made any additions or clarifications directly to the above note. Agree with note above.    Antony Contras, MD Medical Director [facility] Pager: [phone] 11/15/2019 3:27 PM

## 2019-11-15 NOTE — Progress Notes (Signed)
HR in 20s/30s non-sustained asymptomatic. Kirkpatrick informed.

## 2019-11-15 NOTE — Progress Notes (Signed)
At 0704 telemetry informed me of pt having 4.2 sec Asystole. Pt AOx4 asymptomatic, Aroor informed.

## 2019-11-15 NOTE — TOC Transition Note (Signed)
Transition of Care [facility]) - CM/SW Discharge Note   Patient Details  Name: Marsden Zaino MRN: [id number] Date of Birth: 1960-11-19  [facility] CM/SW Contact:  Kermit Balo, RN Phone Number: 11/15/2019, 10:24 AM   Clinical Narrative:    Pt discharging to [facility] In[facility] Rehab today. CM has spoken with patients son and significant other and they are in agreement.  Pt to transport via [facility]. D/c packet at the desk and bedside RN updated.   Number for report: [phone] or [phone]   Final next level of care: IP Rehab Facility Barriers to Discharge: No Barriers Identified   Patient Goals and CMS Choice   CMS Medicare.gov Compare Post Acute Care list provided to:: Patient Represenative (must comment) Choice offered to / list presented to : Patient, Spouse, Adult Children  Discharge Placement                       Discharge Plan and Services   Discharge Planning Services: CM Consult Post Acute Care Choice: IP Rehab                               Social Determinants of Health (SDOH) Interventions     Readmission Risk Interventions No flowsheet data found.

## 2019-11-15 NOTE — Plan of Care (Signed)
  Problem: Education: Goal: Knowledge of General Education information will improve Description: Including pain rating scale, medication(s)/side effects and non-pharmacologic comfort measures Outcome: Progressing   Problem: Health Behavior/Discharge Planning: Goal: Ability to manage health-related needs will improve Outcome: Progressing   Problem: Clinical Measurements: Goal: Ability to maintain clinical measurements within normal limits will improve Outcome: Progressing Goal: Will remain free from infection Outcome: Progressing Goal: Diagnostic test results will improve Outcome: Progressing Goal: Respiratory complications will improve Outcome: Progressing Goal: Cardiovascular complication will be avoided Outcome: Progressing   Problem: Activity: Goal: Risk for activity intolerance will decrease Outcome: Progressing   Problem: Nutrition: Goal: Adequate nutrition will be maintained Outcome: Progressing   Problem: Coping: Goal: Level of anxiety will decrease Outcome: Progressing   Problem: Elimination: Goal: Will not experience complications related to bowel motility Outcome: Progressing Goal: Will not experience complications related to urinary retention Outcome: Progressing   Problem: Pain Managment: Goal: General experience of comfort will improve Outcome: Progressing   Problem: Safety: Goal: Ability to remain free from injury will improve Outcome: Progressing   Problem: Skin Integrity: Goal: Risk for impaired skin integrity will decrease Outcome: Progressing   Problem: Education: Goal: Knowledge of disease or condition will improve Outcome: Progressing Goal: Knowledge of secondary prevention will improve Outcome: Progressing Goal: Knowledge of patient specific risk factors addressed and post discharge goals established will improve Outcome: Progressing Goal: Individualized Educational Video(s) Outcome: Progressing   Problem: Coping: Goal: Will verbalize  positive feelings about self Outcome: Progressing   Problem: Self-Care: Goal: Ability to participate in self-care as condition permits will improve Outcome: Progressing   Problem: Ischemic Stroke/TIA Tissue Perfusion: Goal: Complications of ischemic stroke/TIA will be minimized Outcome: Progressing   

## 2019-11-15 NOTE — Progress Notes (Signed)
STROKE TEAM PROGRESS NOTE   INTERVAL HISTORY Patient is sitting up in bed and is eating breakfast with assistance from CNA. He is stable and ready for d/c today to [facility] inpatient rehab  OBJECTIVE Vitals:   11/14/19 2308 11/15/19 0406 11/15/19 0653 11/15/19 0815  BP:  121/74  (!) 145/93  Pulse:  85 79 89  Resp:  18  19  Temp:  97.7 F (36.5 C)  97.7 F (36.5 C)  TempSrc:  Oral  Oral  SpO2: 97% 95%  96%  Weight:      Height:        CBC:  Recent Labs  Lab 11/09/19 0616  WBC 6.2  HGB 13.6  HCT 39.3  MCV 94.5  PLT 223    Basic Metabolic Panel:  Recent Labs  Lab 11/09/19 0616  NA 133*  K 4.0  CL 97*  CO2 27  GLUCOSE 107*  BUN 10  CREATININE 0.76  CALCIUM 8.9    Lipid Panel:     Component Value Date/Time   CHOL 128 11/05/2019 0552   TRIG 82 11/05/2019 0552   HDL 37 (L) 11/05/2019 0552   CHOLHDL 3.5 11/05/2019 0552   VLDL 16 11/05/2019 0552   LDLCALC 75 11/05/2019 0552   HgbA1c:  Lab Results  Component Value Date   HGBA1C 6.0 (H) 11/05/2019   Urine Drug Screen:     Component Value Date/Time   LABOPIA NONE DETECTED 11/04/2019 1927   COCAINSCRNUR NONE DETECTED 11/04/2019 1927   LABBENZ NONE DETECTED 11/04/2019 1927   AMPHETMU NONE DETECTED 11/04/2019 1927   THCU NONE DETECTED 11/04/2019 1927   LABBARB NONE DETECTED 11/04/2019 1927    Alcohol Level No results found for: ETH  IMAGING psat 24h No results found.   PHYSICAL EXAM    General - Well nourished, well developed, nodistress Ophthalmologic - fundi not visualized due to noncooperation.  Cardiovascular - irregularly irregular heart rate and rhythm.  Neuro - awake alert, orientated. Mild dysarthria but understandable, no aphasia. Able to name and repeat.  Left hemianopia. gaze midline, but noted initial right preference but can cross the midline and track examiner,   Left facial droop. Tongue protrusion midline. LUE at least 4+/5 and RUE 3/5 proximal and distal. LLE 4+/5 and RLE 3/5. Sensation  subjectively symmetrical but complains of RLE tingling at the thigh (in no acute distress), coordination intact FTN left hand and gait not tested.   ASSESSMENT/PLAN Nicholas Estes is a 59 y.o. male with history of hypertension presenting with acute onset of dysarthria, left hemiplegia and left facial droop. He did not receive IV t-PA due to late presentation (>4.5 hours from time of onset) IR - Right P2A occlusion. Multiple passes performed (2x solitaire, 3x embotrap, !x aspiration) with minimal recanalization to the level of the P2P segment (TICI2A).  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 ASPECTS is 10   CTA H&N - Occlusion of the right PCA at the proximal P2 segment.   CT Perfusion - positive for penumbra  IR unsuccessful right P2 thrombectomy  MRI head 11/04/19 - bilateral PCA scattered infarcts and right medullary infarcts, small right paramesencephalic cistern SAH  CT 11/04/19 - confirmed small right perimesencephalic cistern SAH  MRI 4/19 new posterior R midbrain and high posterior L occipital lobe infarcts. R PCA infarct w/o hemorrhagic transformation. Small  R paramesencephalic cistern SAH  improving.    MRI C-spine R>L PCA and brainstem infarcts, including medulla and upper spinal cord.   2D Echo - EF 55-60%. No source of embolus. LA mildly dilated  LE venous Doppler negative for DVT  Lacey Jensen Virus 2 - negative  LDL 75  HgbA1c 6.0  UDS - negative  VTE prophylaxis - Heparin 5000 units sq tid   No antithrombotic prior to admission, now on eliquis for stroke prevention. Continue on discharge.    Therapy recommendations:  CIR   Disposition:  D/c today to [facility] rehab  Neuro worsening with left sided weakness - BP dependent  BP low 4/17-> improved after IVF and albumin  IV bolus 1L  Albumin Q6h x  4  Repeat MRI brain shows new R midbrain infarct and high posterior L occipital lobe infarcts, confluent right PCA infarct extension and right medullary and high cervical cord infarct extension  MRI C-spine R>L PCA and brainstem infarcts, including medulla and upper spinal cord.   BP stable at goal  BP goal 120-160  on midodrine 5mg  tid   Atrial Fibrillation w/ Pauses - new diagnosis  EKG captured afib  Persistent afib on tele with a episode of 5 sec pause 4/22 overnight  Rate controlled at this time  Will consider metoprolol if RVR  On eliquis 5 bid  Respiratory distress, improved  4/20 requiring O2 and albuterol nebs.   CXR on 4/17 w/ stable cardiomegaly w/o overt edema.   On chest PT  NT suction PRN  Stable now  Alcohol abuse  Heavy drinker at home as per son  CIWA protocol for another day  Vitamin B1, multivitamin, folic acid  Seizure precaution  Hypotension  Hx of hypertension  Home BP meds: Amlodipine 10  Current BP meds: none  Was low end and now improved to 120s-140s . BP goal 120-160 . On midodrine 5  Tobacco abuse  Current smoker  Smoking cessation counseling provided  Nicotine patch provided  Pt is willing to quit  Dysphagia . Secondary to stroke . Cortrak out by pt 4/22 early am . On dysphagia 3 and thin . encourage and monitor po intake . On gentle hydration . Speech on board   Other Stroke Risk Factors  Obesity, Body mass index is 32.99 kg/m., recommend weight loss, diet and exercise as appropriate   Other Active Problems  Code status - Full code  Meralgia paresthetica - gabapentin and lidoderm patches prn  Dry eyes - saline drops  Hospital day # 11 Continue therapies and mobilize out of bed.  Continue Eliquis for A. fib and stroke prevention.  Transfer to inpatient rehab at [facility] today.  Omarrion Carmer Metzger-Cihelka, ARNP-C, ANVP-BC Pager: [phone]  To contact Stroke Continuity provider, please refer to  [email or web address] After hours, contact General Neurology

## 2019-11-15 NOTE — Plan of Care (Signed)

## 2020-01-01 ENCOUNTER — Encounter: Payer: Self-pay | Admitting: Neurology

## 2020-01-01 ENCOUNTER — Ambulatory Visit: Admitting: Neurology

## 2020-01-02 ENCOUNTER — Telehealth: Payer: Self-pay

## 2020-01-02 NOTE — Telephone Encounter (Signed)
Patient no show for appointment on June 14/,2021. 

## 2020-02-13 ENCOUNTER — Other Ambulatory Visit: Payer: Self-pay

## 2020-02-13 NOTE — Patient Outreach (Signed)
[facility]) Care Management  02/13/2020  Nicholas Estes [birth year] [id number]   Telephone outreach to patient to obtain mRS was successfully completed. MRS=2  [name] [facility] Management Assistant [phone]

## 2021-08-04 ENCOUNTER — Encounter (HOSPITAL_COMMUNITY): Payer: Self-pay | Admitting: *Deleted

## 2021-08-04 ENCOUNTER — Emergency Department (HOSPITAL_COMMUNITY)
Admission: EM | Admit: 2021-08-04 | Discharge: 2021-08-05 | Disposition: A | Discharge status: Home / Self Care | Attending: Emergency Medicine | Admitting: Emergency Medicine

## 2021-08-04 ENCOUNTER — Other Ambulatory Visit: Payer: Self-pay

## 2021-08-04 ENCOUNTER — Emergency Department (HOSPITAL_COMMUNITY)

## 2021-08-04 DIAGNOSIS — Z7901 Long term (current) use of anticoagulants: Secondary | ICD-10-CM | POA: Diagnosis not present

## 2021-08-04 DIAGNOSIS — N390 Urinary tract infection, site not specified: Secondary | ICD-10-CM | POA: Diagnosis not present

## 2021-08-04 DIAGNOSIS — Z7982 Long term (current) use of aspirin: Secondary | ICD-10-CM | POA: Diagnosis not present

## 2021-08-04 DIAGNOSIS — R000 Tachycardia, unspecified: Secondary | ICD-10-CM | POA: Diagnosis not present

## 2021-08-04 DIAGNOSIS — R251 Tremor, unspecified: Secondary | ICD-10-CM | POA: Diagnosis not present

## 2021-08-04 DIAGNOSIS — G252 Other specified forms of tremor: Secondary | ICD-10-CM

## 2021-08-04 DIAGNOSIS — R4182 Altered mental status, unspecified: Secondary | ICD-10-CM | POA: Diagnosis present

## 2021-08-04 LAB — CBC WITH DIFFERENTIAL/PLATELET
Abs Immature Granulocytes: 0.02 10*3/uL (ref 0.00–0.07)
Basophils Absolute: 0 10*3/uL (ref 0.0–0.1)
Basophils Relative: 0 %
Eosinophils Absolute: 0.1 10*3/uL (ref 0.0–0.5)
Eosinophils Relative: 2 %
HCT: 41.6 % (ref 39.0–52.0)
Hemoglobin: 14.9 g/dL (ref 13.0–17.0)
Immature Granulocytes: 0 %
Lymphocytes Relative: 26 %
Lymphs Abs: 1.9 10*3/uL (ref 0.7–4.0)
MCH: 35.1 pg — ABNORMAL HIGH (ref 26.0–34.0)
MCHC: 35.8 g/dL (ref 30.0–36.0)
MCV: 98.1 fL (ref 80.0–100.0)
Monocytes Absolute: 0.5 10*3/uL (ref 0.1–1.0)
Monocytes Relative: 7 %
Neutro Abs: 4.9 10*3/uL (ref 1.7–7.7)
Neutrophils Relative %: 65 %
Platelets: 249 10*3/uL (ref 150–400)
RBC: 4.24 MIL/uL (ref 4.22–5.81)
RDW: 13 % (ref 11.5–15.5)
WBC: 7.4 10*3/uL (ref 4.0–10.5)
nRBC: 0 % (ref 0.0–0.2)

## 2021-08-04 LAB — COMPREHENSIVE METABOLIC PANEL
ALT: 18 U/L (ref 0–44)
AST: 17 U/L (ref 15–41)
Albumin: 3.7 g/dL (ref 3.5–5.0)
Alkaline Phosphatase: 95 U/L (ref 38–126)
Anion gap: 18 — ABNORMAL HIGH (ref 5–15)
BUN: 7 mg/dL — ABNORMAL LOW (ref 8–23)
CO2: 20 mmol/L — ABNORMAL LOW (ref 22–32)
Calcium: 9 mg/dL (ref 8.9–10.3)
Chloride: 98 mmol/L (ref 98–111)
Creatinine, Ser: 0.97 mg/dL (ref 0.61–1.24)
GFR, Estimated: >60 mL/min (ref >60)
Glucose, Bld: 89 mg/dL (ref 70–99)
Potassium: 3.3 mmol/L — ABNORMAL LOW (ref 3.5–5.1)
Sodium: 136 mmol/L (ref 135–145)
Total Bilirubin: 0.4 mg/dL (ref 0.3–1.2)
Total Protein: 6.6 g/dL (ref 6.5–8.1)

## 2021-08-04 LAB — CBG MONITORING, ED: Glucose-Capillary: 93 mg/dL (ref 70–99)

## 2021-08-04 LAB — TSH: TSH: 1.957 u[IU]/mL (ref 0.350–4.500)

## 2021-08-04 LAB — ETHANOL: Alcohol, Ethyl (B): 46 mg/dL — ABNORMAL HIGH (ref <10)

## 2021-08-04 LAB — MAGNESIUM: Magnesium: 1.8 mg/dL (ref 1.7–2.4)

## 2021-08-04 MED ORDER — LACTATED RINGERS IV BOLUS
1000.0000 mL | Freq: Once | INTRAVENOUS | Status: AC
  Administered 2021-08-04: 1000 mL via INTRAVENOUS

## 2021-08-04 MED ORDER — METOPROLOL TARTRATE 25 MG PO TABS
12.5000 mg | ORAL_TABLET | Freq: Once | ORAL | Status: AC
  Administered 2021-08-04: 12.5 mg via ORAL
  Filled 2021-08-04: qty 1

## 2021-08-04 MED ORDER — CALCIUM CARBONATE ANTACID 500 MG PO CHEW
1.0000 | CHEWABLE_TABLET | Freq: Once | ORAL | Status: AC
  Administered 2021-08-04: 200 mg via ORAL
  Filled 2021-08-04: qty 1

## 2021-08-04 MED ORDER — ONDANSETRON HCL 4 MG/2ML IJ SOLN
4.0000 mg | Freq: Once | INTRAMUSCULAR | Status: AC
  Administered 2021-08-04: 4 mg via INTRAVENOUS
  Filled 2021-08-04: qty 2

## 2021-08-04 NOTE — ED Notes (Signed)
Contacted lab on delay in testing; labs sent prior to orders and were placed as save tubes; tech running remaining labwork now

## 2021-08-04 NOTE — ED Provider Notes (Signed)
[facility] EMERGENCY DEPARTMENT Provider Note   CSN: [id number] Arrival date & time: 08/04/21  2106     History  Chief Complaint  Patient presents with   Altered Mental Status    Nicholas Estes is a 61 y.o. male.  HPI  This is a 61 yo male who presents to our ED for evaluation of AMS and tremors.   Hx obtained from both patient and patient's wife. Reportedly the patient was at his baseline today and was participating in a garage clean out. He drove his wheelchair outside when its battery died.   The wife then observed the patient crawling on the ground when he started having a tremulous episode. During the episode the patient was attempting to verbalize to his wife.   Patient admits to drinking a few beers today but denies infectious symptoms. Denies head trauma. Does admit to starting Trileptal recently.   The patient was initially mildly confused but is now alert and oriented x4. Denies chest pain, sob, abdominal pain, NEW weakness, or numbness.       Home Medications Prior to Admission medications   Medication Sig Start Date End Date Taking? Authorizing Provider  acetaminophen (TYLENOL) 325 MG tablet Take 2 tablets (650 mg total) by mouth every 4 (four) hours as needed for mild pain (or temp > 37.5 C (99.5 F)). 11/15/19  Yes Metzger-Cihelka, Desiree, NP  albuterol (PROVENTIL) (2.5 MG/3ML) 0.083% nebulizer solution Take 3 mLs (2.5 mg total) by nebulization every 4 (four) hours as needed for wheezing or shortness of breath. 11/15/19  Yes Metzger-Cihelka, Desiree, NP  apixaban (ELIQUIS) 5 MG TABS tablet Take 1 tablet (5 mg total) by mouth 2 (two) times daily. 11/15/19  Yes Metzger-Cihelka, Desiree, NP  aspirin EC 81 MG tablet Take 81 mg by mouth daily. Swallow whole.   Yes [provider]  buPROPion (WELLBUTRIN XL) 150 MG 24 hr tablet Take 150 mg by mouth daily. 04/14/21  Yes [provider]  cephALEXin (KEFLEX) 500 MG capsule Take 1 capsule  (500 mg total) by mouth 3 (three) times daily. 08/05/21  Yes Tilden Fossa, MD  folic acid (FOLVITE) 1 MG tablet Take 1 tablet (1 mg total) by mouth daily. 11/16/19  Yes Metzger-Cihelka, Desiree, NP  furosemide (LASIX) 40 MG tablet Take 40 mg by mouth daily. 07/11/21  Yes [provider]  OXcarbazepine (TRILEPTAL) 150 MG tablet Take 150 mg by mouth daily. Take one half tab by mouth every morning and noon   Yes [provider]  Oxcarbazepine (TRILEPTAL) 300 MG tablet Take 300 mg by mouth 2 (two) times daily. **also 3 tablets at bedtime** 04/14/21  Yes [provider]  tamsulosin (FLOMAX) 0.4 MG CAPS capsule Take 1 capsule by mouth daily. 07/11/21  Yes [provider]  gabapentin (NEURONTIN) 300 MG capsule Take 1 capsule (300 mg total) by mouth 3 (three) times daily. Patient not taking: Reported on 08/05/2021 11/15/19   Metzger-Cihelka, Cristie Hem, NP  midodrine (PROAMATINE) 5 MG tablet Take 1 tablet (5 mg total) by mouth every 8 (eight) hours. Patient not taking: Reported on 08/05/2021 11/15/19   Metzger-Cihelka, Cristie Hem, NP  potassium chloride (MICRO-K) 10 MEQ CR capsule TAKE ONE CAPSULE BY MOUTH DAILY (TAKE WITH FOOD) Patient not taking: Reported on 08/05/2021 12/26/20   [provider]  thiamine 100 MG tablet Take 1 tablet (100 mg total) by mouth daily. Patient not taking: Reported on 08/05/2021 11/16/19   Metzger-Cihelka, Cristie Hem, NP      Allergies  Patient has no known allergies.    Review of Systems   Review of Systems  All other systems reviewed and are negative.  Physical Exam Updated Vital Signs BP 104/60    Pulse (!) 116    Temp 97.9 F (36.6 C) (Tympanic)    Resp (!) 21    SpO2 96%  Physical Exam Vitals and nursing note reviewed.  Constitutional:      General: He is not in acute distress.    Appearance: He is well-developed. He is obese. He is not ill-appearing.  HENT:     Head: Normocephalic and atraumatic.  Eyes:     Conjunctiva/sclera:  Conjunctivae normal.  Cardiovascular:     Rate and Rhythm: Tachycardia present. Rhythm irregular.     Heart sounds: No murmur heard. Pulmonary:     Effort: Pulmonary effort is normal. No respiratory distress.     Breath sounds: Normal breath sounds.  Abdominal:     Palpations: Abdomen is soft.     Tenderness: There is no abdominal tenderness.  Musculoskeletal:        General: No swelling.     Cervical back: Neck supple.  Skin:    General: Skin is warm and dry.     Capillary Refill: Capillary refill takes less than 2 seconds.  Neurological:     Mental Status: He is alert and oriented to person, place, and time. Mental status is at baseline.     Cranial Nerves: No cranial nerve deficit.     Sensory: Sensory deficit (baseline LLE weakness.) present.     Motor: Weakness (symetric lower exstremity weakness.) present.     Comments: Mild tremor noted.    Psychiatric:        Mood and Affect: Mood normal.    ED Results / Procedures / Treatments   Labs (all labs ordered are listed, but only abnormal results are displayed) Labs Reviewed  COMPREHENSIVE METABOLIC PANEL - Abnormal; Notable for the following components:      Result Value   Potassium 3.3 (*)    CO2 20 (*)    BUN 7 (*)    Anion gap 18 (*)    All other components within normal limits  CBC WITH DIFFERENTIAL/PLATELET - Abnormal; Notable for the following components:   MCH 35.1 (*)    All other components within normal limits  ETHANOL - Abnormal; Notable for the following components:   Alcohol, Ethyl (B) 46 (*)    All other components within normal limits  URINALYSIS, ROUTINE W REFLEX MICROSCOPIC - Abnormal; Notable for the following components:   APPearance CLOUDY (*)    Hgb urine dipstick LARGE (*)    Nitrite POSITIVE (*)    Leukocytes,Ua LARGE (*)    All other components within normal limits  RAPID URINE DRUG SCREEN, HOSP PERFORMED - Abnormal; Notable for the following components:   Tetrahydrocannabinol POSITIVE (*)     All other components within normal limits  URINALYSIS, MICROSCOPIC (REFLEX) - Abnormal; Notable for the following components:   Bacteria, UA RARE (*)    All other components within normal limits  URINE CULTURE  TSH  MAGNESIUM  CBG MONITORING, ED    EKG EKG Interpretation  Date/Time:  Monday August 04 2021 21:14:49 EST Ventricular Rate:  119 PR Interval:    QRS Duration: 103 QT Interval:  338 QTC Calculation: 476 R Axis:   28 Text Interpretation: Atrial fibrillation Borderline prolonged QT interval No significant change since last tracing Confirmed by Linwood Dibbles [phone]) on 08/04/2021  9:23:54 PM  Radiology No results found.  Procedures Procedures    Medications Ordered in ED Medications  lactated ringers bolus 1,000 mL (0 mLs Intravenous Stopped 08/04/21 2333)  lactated ringers bolus 1,000 mL (0 mLs Intravenous Stopped 08/04/21 2333)  metoprolol tartrate (LOPRESSOR) tablet 12.5 mg (12.5 mg Oral Given 08/04/21 2241)  calcium carbonate (TUMS - dosed in mg elemental calcium) chewable tablet 200 mg of elemental calcium (200 mg of elemental calcium Oral Given 08/04/21 2322)  ondansetron (ZOFRAN) injection 4 mg (4 mg Intravenous Given 08/04/21 2334)  cefTRIAXone (ROCEPHIN) 1 g in sodium chloride 0.9 % 100 mL IVPB (0 g Intravenous Stopped 08/05/21 0210)    ED Course/ Medical Decision Making/ A&P                           Medical Decision Making Amount and/or Complexity of Data Reviewed Labs: ordered. Radiology: ordered.  Risk OTC drugs. Prescription drug management.   This is a 61 yo presenting for AMS and tremors. Patient is HDS afebrile and non-toxic appearing.  Ddx considered but not limited to the following: Focal Seizure, electrolyte abnormality, intoxication, ischemic stroke, hemorrhagic stroke.   Imaging reviewed independently does not reveal acute ischemic or hemorrhagic stroke. CXR without focal consolidation to suggest pna.   Lab workup reviewed independently  reveals: mildly elevated ethanol of 46, CBC wnl, CMP with mild hypokalemia, tsh wnl. Urine Nitrite (+) will discharge patient with Abx prescription.  Keflex.  Patient had full return to baseline while in the ED. I suspect the patient did have a sz secondary to etoh use and was post ictal on EMS arrival. EKG obtained and reviewed reveals the patient was tachycardic in atrial fibrillation. Patient had not taken his rate control agents prior to arrival. Oral metoprolol and zofran were administered to the patient.    Discussion w/ patient on the importance of following up with PCP for medication adjustments were had. Patient was Discharged home to the care of his wife. Strict return precautions were provided.      Final Clinical Impression(s) / ED Diagnoses Final diagnoses:  Coarse tremors  Acute UTI    Rx / DC Orders ED Discharge Orders          Ordered    cephALEXin (KEFLEX) 500 MG capsule  3 times daily        08/05/21 0118              Camila LiMcIlwain, Josehua Hammar S, MD 08/07/21 1457    Linwood DibblesKnapp, Jon, MD 08/08/21 [phone]

## 2021-08-04 NOTE — ED Triage Notes (Signed)
Per EMS report, Pt from home for AMS and tremors. EMS reported significant tremors in all limbs on their arrival that have resolved on arrival. Per wife, they had been working in the garage, pt drove his wheelchair around the house and started shaking and slid out of the wheelchair. Pt is a&Ox 3 on arrival, admits to 2-3 drinks

## 2021-08-05 LAB — URINALYSIS, ROUTINE W REFLEX MICROSCOPIC
Bilirubin Urine: NEGATIVE
Glucose, UA: NEGATIVE mg/dL
Ketones, ur: NEGATIVE mg/dL
Nitrite: POSITIVE — AB
Protein, ur: NEGATIVE mg/dL
Specific Gravity, Urine: 1.015 (ref 1.005–1.030)
pH: 6.5 (ref 5.0–8.0)

## 2021-08-05 LAB — RAPID URINE DRUG SCREEN, HOSP PERFORMED
Amphetamines: NOT DETECTED
Barbiturates: NOT DETECTED
Benzodiazepines: NOT DETECTED
Cocaine: NOT DETECTED
Opiates: NOT DETECTED
Tetrahydrocannabinol: POSITIVE — AB

## 2021-08-05 LAB — URINALYSIS, MICROSCOPIC (REFLEX)

## 2021-08-05 MED ORDER — CEPHALEXIN 500 MG PO CAPS: 500.0000 mg | ORAL_CAPSULE | Freq: Three times a day (TID) | ORAL | 0 refills | Status: DC

## 2021-08-05 MED ORDER — SODIUM CHLORIDE 0.9 % IV SOLN
1.0000 g | Freq: Once | INTRAVENOUS | Status: AC
  Administered 2021-08-05: 1 g via INTRAVENOUS
  Filled 2021-08-05: qty 10

## 2021-08-05 NOTE — Discharge Instructions (Signed)
[facility] with [facility] for [facility] of [facility]ors.   Specifically would recommend Trileptal levels.  CT Head and CXR within normal limits here. Electrolytes significant for mildly low potassium level.

## 2021-10-28 ENCOUNTER — Emergency Department (HOSPITAL_COMMUNITY)

## 2021-10-28 ENCOUNTER — Encounter (HOSPITAL_COMMUNITY): Payer: Self-pay

## 2021-10-28 ENCOUNTER — Inpatient Hospital Stay (HOSPITAL_COMMUNITY)
Admission: EM | Admit: 2021-10-28 | Discharge: 2021-11-11 | DRG: 917 | Disposition: A | Discharge status: Home Health | Attending: Family Medicine | Admitting: Family Medicine

## 2021-10-28 ENCOUNTER — Other Ambulatory Visit: Payer: Self-pay

## 2021-10-28 DIAGNOSIS — R339 Retention of urine, unspecified: Secondary | ICD-10-CM | POA: Diagnosis not present

## 2021-10-28 DIAGNOSIS — D649 Anemia, unspecified: Secondary | ICD-10-CM

## 2021-10-28 DIAGNOSIS — F10931 Alcohol use, unspecified with withdrawal delirium: Secondary | ICD-10-CM | POA: Diagnosis not present

## 2021-10-28 DIAGNOSIS — R609 Edema, unspecified: Secondary | ICD-10-CM | POA: Diagnosis not present

## 2021-10-28 DIAGNOSIS — I10 Essential (primary) hypertension: Secondary | ICD-10-CM | POA: Diagnosis not present

## 2021-10-28 DIAGNOSIS — I4819 Other persistent atrial fibrillation: Secondary | ICD-10-CM | POA: Diagnosis present

## 2021-10-28 DIAGNOSIS — R1310 Dysphagia, unspecified: Secondary | ICD-10-CM | POA: Diagnosis not present

## 2021-10-28 DIAGNOSIS — R4182 Altered mental status, unspecified: Secondary | ICD-10-CM | POA: Diagnosis present

## 2021-10-28 DIAGNOSIS — N210 Calculus in bladder: Secondary | ICD-10-CM | POA: Diagnosis present

## 2021-10-28 DIAGNOSIS — F1210 Cannabis abuse, uncomplicated: Secondary | ICD-10-CM | POA: Diagnosis present

## 2021-10-28 DIAGNOSIS — I69398 Other sequelae of cerebral infarction: Secondary | ICD-10-CM | POA: Diagnosis not present

## 2021-10-28 DIAGNOSIS — I480 Paroxysmal atrial fibrillation: Secondary | ICD-10-CM

## 2021-10-28 DIAGNOSIS — R45851 Suicidal ideations: Secondary | ICD-10-CM | POA: Diagnosis present

## 2021-10-28 DIAGNOSIS — J9602 Acute respiratory failure with hypercapnia: Secondary | ICD-10-CM | POA: Diagnosis present

## 2021-10-28 DIAGNOSIS — F10139 Alcohol abuse with withdrawal, unspecified: Secondary | ICD-10-CM | POA: Diagnosis present

## 2021-10-28 DIAGNOSIS — I9589 Other hypotension: Secondary | ICD-10-CM

## 2021-10-28 DIAGNOSIS — G629 Polyneuropathy, unspecified: Secondary | ICD-10-CM | POA: Diagnosis present

## 2021-10-28 DIAGNOSIS — T466X1A Poisoning by antihyperlipidemic and antiarteriosclerotic drugs, accidental (unintentional), initial encounter: Secondary | ICD-10-CM | POA: Diagnosis present

## 2021-10-28 DIAGNOSIS — J9601 Acute respiratory failure with hypoxia: Secondary | ICD-10-CM | POA: Diagnosis present

## 2021-10-28 DIAGNOSIS — E861 Hypovolemia: Secondary | ICD-10-CM | POA: Diagnosis not present

## 2021-10-28 DIAGNOSIS — G928 Other toxic encephalopathy: Secondary | ICD-10-CM | POA: Diagnosis present

## 2021-10-28 DIAGNOSIS — H53462 Homonymous bilateral field defects, left side: Secondary | ICD-10-CM | POA: Diagnosis present

## 2021-10-28 DIAGNOSIS — M6282 Rhabdomyolysis: Secondary | ICD-10-CM | POA: Diagnosis present

## 2021-10-28 DIAGNOSIS — I5032 Chronic diastolic (congestive) heart failure: Secondary | ICD-10-CM | POA: Diagnosis present

## 2021-10-28 DIAGNOSIS — G9340 Encephalopathy, unspecified: Secondary | ICD-10-CM | POA: Diagnosis present

## 2021-10-28 DIAGNOSIS — K567 Ileus, unspecified: Secondary | ICD-10-CM | POA: Diagnosis not present

## 2021-10-28 DIAGNOSIS — M7989 Other specified soft tissue disorders: Secondary | ICD-10-CM | POA: Diagnosis not present

## 2021-10-28 DIAGNOSIS — G40909 Epilepsy, unspecified, not intractable, without status epilepticus: Secondary | ICD-10-CM | POA: Diagnosis present

## 2021-10-28 DIAGNOSIS — F32A Depression, unspecified: Secondary | ICD-10-CM | POA: Diagnosis present

## 2021-10-28 DIAGNOSIS — Z781 Physical restraint status: Secondary | ICD-10-CM

## 2021-10-28 DIAGNOSIS — J690 Pneumonitis due to inhalation of food and vomit: Secondary | ICD-10-CM | POA: Diagnosis present

## 2021-10-28 DIAGNOSIS — Z6832 Body mass index (BMI) 32.0-32.9, adult: Secondary | ICD-10-CM

## 2021-10-28 DIAGNOSIS — T421X1A Poisoning by iminostilbenes, accidental (unintentional), initial encounter: Principal | ICD-10-CM | POA: Diagnosis present

## 2021-10-28 DIAGNOSIS — I110 Hypertensive heart disease with heart failure: Secondary | ICD-10-CM | POA: Diagnosis present

## 2021-10-28 DIAGNOSIS — Z9911 Dependence on respirator [ventilator] status: Secondary | ICD-10-CM | POA: Diagnosis not present

## 2021-10-28 DIAGNOSIS — N179 Acute kidney failure, unspecified: Secondary | ICD-10-CM | POA: Diagnosis present

## 2021-10-28 DIAGNOSIS — E7800 Pure hypercholesterolemia, unspecified: Secondary | ICD-10-CM | POA: Diagnosis present

## 2021-10-28 DIAGNOSIS — Z79899 Other long term (current) drug therapy: Secondary | ICD-10-CM

## 2021-10-28 DIAGNOSIS — F449 Dissociative and conversion disorder, unspecified: Secondary | ICD-10-CM | POA: Diagnosis present

## 2021-10-28 DIAGNOSIS — N202 Calculus of kidney with calculus of ureter: Secondary | ICD-10-CM | POA: Diagnosis present

## 2021-10-28 DIAGNOSIS — F10939 Alcohol use, unspecified with withdrawal, unspecified: Secondary | ICD-10-CM | POA: Diagnosis present

## 2021-10-28 DIAGNOSIS — Z7901 Long term (current) use of anticoagulants: Secondary | ICD-10-CM

## 2021-10-28 DIAGNOSIS — E876 Hypokalemia: Secondary | ICD-10-CM | POA: Diagnosis not present

## 2021-10-28 DIAGNOSIS — N401 Enlarged prostate with lower urinary tract symptoms: Secondary | ICD-10-CM | POA: Diagnosis present

## 2021-10-28 DIAGNOSIS — I959 Hypotension, unspecified: Secondary | ICD-10-CM | POA: Diagnosis present

## 2021-10-28 DIAGNOSIS — E860 Dehydration: Secondary | ICD-10-CM | POA: Diagnosis present

## 2021-10-28 DIAGNOSIS — N39490 Overflow incontinence: Secondary | ICD-10-CM | POA: Diagnosis present

## 2021-10-28 DIAGNOSIS — R208 Other disturbances of skin sensation: Secondary | ICD-10-CM | POA: Diagnosis not present

## 2021-10-28 DIAGNOSIS — L8990 Pressure ulcer of unspecified site, unspecified stage: Secondary | ICD-10-CM | POA: Insufficient documentation

## 2021-10-28 DIAGNOSIS — L89151 Pressure ulcer of sacral region, stage 1: Secondary | ICD-10-CM | POA: Diagnosis present

## 2021-10-28 DIAGNOSIS — G9341 Metabolic encephalopathy: Secondary | ICD-10-CM | POA: Diagnosis not present

## 2021-10-28 DIAGNOSIS — Z7982 Long term (current) use of aspirin: Secondary | ICD-10-CM

## 2021-10-28 LAB — MAGNESIUM: Magnesium: 1.6 mg/dL — ABNORMAL LOW (ref 1.7–2.4)

## 2021-10-28 LAB — URINALYSIS, ROUTINE W REFLEX MICROSCOPIC
Bilirubin Urine: NEGATIVE
Glucose, UA: NEGATIVE mg/dL
Ketones, ur: 5 mg/dL — AB
Nitrite: NEGATIVE
Protein, ur: NEGATIVE mg/dL
Specific Gravity, Urine: 1.006 (ref 1.005–1.030)
pH: 5 (ref 5.0–8.0)

## 2021-10-28 LAB — CBC WITH DIFFERENTIAL/PLATELET
Abs Immature Granulocytes: 0.07 10*3/uL (ref 0.00–0.07)
Basophils Absolute: 0.1 10*3/uL (ref 0.0–0.1)
Basophils Relative: 0 %
Eosinophils Absolute: 0.1 10*3/uL (ref 0.0–0.5)
Eosinophils Relative: 1 %
HCT: 43.9 % (ref 39.0–52.0)
Hemoglobin: 15.2 g/dL (ref 13.0–17.0)
Immature Granulocytes: 1 %
Lymphocytes Relative: 12 %
Lymphs Abs: 1.7 10*3/uL (ref 0.7–4.0)
MCH: 33.6 pg (ref 26.0–34.0)
MCHC: 34.6 g/dL (ref 30.0–36.0)
MCV: 96.9 fL (ref 80.0–100.0)
Monocytes Absolute: 0.8 10*3/uL (ref 0.1–1.0)
Monocytes Relative: 5 %
Neutro Abs: 12.1 10*3/uL — ABNORMAL HIGH (ref 1.7–7.7)
Neutrophils Relative %: 81 %
Platelets: 235 10*3/uL (ref 150–400)
RBC: 4.53 MIL/uL (ref 4.22–5.81)
RDW: 12.1 % (ref 11.5–15.5)
WBC: 14.8 10*3/uL — ABNORMAL HIGH (ref 4.0–10.5)
nRBC: 0 % (ref 0.0–0.2)

## 2021-10-28 LAB — I-STAT VENOUS BLOOD GAS, ED
Acid-Base Excess: 4 mmol/L — ABNORMAL HIGH (ref 0.0–2.0)
Bicarbonate: 29.4 mmol/L — ABNORMAL HIGH (ref 20.0–28.0)
Calcium, Ion: 1 mmol/L — ABNORMAL LOW (ref 1.15–1.40)
HCT: 45 % (ref 39.0–52.0)
Hemoglobin: 15.3 g/dL (ref 13.0–17.0)
O2 Saturation: 68 %
Potassium: 5.2 mmol/L — ABNORMAL HIGH (ref 3.5–5.1)
Sodium: 135 mmol/L (ref 135–145)
TCO2: 31 mmol/L (ref 22–32)
pCO2, Ven: 46 mmHg (ref 44–60)
pH, Ven: 7.414 (ref 7.25–7.43)
pO2, Ven: 35 mmHg (ref 32–45)

## 2021-10-28 LAB — COMPREHENSIVE METABOLIC PANEL
ALT: 23 U/L (ref 0–44)
ALT: 21 U/L (ref 0–44)
AST: 24 U/L (ref 15–41)
AST: 22 U/L (ref 15–41)
Albumin: 4.1 g/dL (ref 3.5–5.0)
Albumin: 3.7 g/dL (ref 3.5–5.0)
Alkaline Phosphatase: 96 U/L (ref 38–126)
Alkaline Phosphatase: 84 U/L (ref 38–126)
Anion gap: 14 (ref 5–15)
Anion gap: 12 (ref 5–15)
BUN: 12 mg/dL (ref 8–23)
BUN: 13 mg/dL (ref 8–23)
CO2: 25 mmol/L (ref 22–32)
CO2: 27 mmol/L (ref 22–32)
Calcium: 9 mg/dL (ref 8.9–10.3)
Calcium: 8.7 mg/dL — ABNORMAL LOW (ref 8.9–10.3)
Chloride: 97 mmol/L — ABNORMAL LOW (ref 98–111)
Chloride: 100 mmol/L (ref 98–111)
Creatinine, Ser: 1.09 mg/dL (ref 0.61–1.24)
Creatinine, Ser: 1.33 mg/dL — ABNORMAL HIGH (ref 0.61–1.24)
GFR, Estimated: >60 mL/min (ref >60)
GFR, Estimated: >60 mL/min (ref >60)
Glucose, Bld: 159 mg/dL — ABNORMAL HIGH (ref 70–99)
Glucose, Bld: 119 mg/dL — ABNORMAL HIGH (ref 70–99)
Potassium: 4.1 mmol/L (ref 3.5–5.1)
Potassium: 3.2 mmol/L — ABNORMAL LOW (ref 3.5–5.1)
Sodium: 136 mmol/L (ref 135–145)
Sodium: 139 mmol/L (ref 135–145)
Total Bilirubin: 1.5 mg/dL — ABNORMAL HIGH (ref 0.3–1.2)
Total Bilirubin: 1 mg/dL (ref 0.3–1.2)
Total Protein: 7.3 g/dL (ref 6.5–8.1)
Total Protein: 6.8 g/dL (ref 6.5–8.1)

## 2021-10-28 LAB — RAPID URINE DRUG SCREEN, HOSP PERFORMED
Amphetamines: NOT DETECTED
Barbiturates: NOT DETECTED
Benzodiazepines: NOT DETECTED
Cocaine: NOT DETECTED
Opiates: NOT DETECTED
Tetrahydrocannabinol: POSITIVE — AB

## 2021-10-28 LAB — ACETAMINOPHEN LEVEL
Acetaminophen (Tylenol), Serum: <10 ug/mL — ABNORMAL LOW (ref 10–30)
Acetaminophen (Tylenol), Serum: <10 ug/mL — ABNORMAL LOW (ref 10–30)

## 2021-10-28 LAB — POCT I-STAT 7, (LYTES, BLD GAS, ICA,H+H)
Acid-base deficit: 2 mmol/L (ref 0.0–2.0)
Bicarbonate: 24.5 mmol/L (ref 20.0–28.0)
Calcium, Ion: 1.12 mmol/L — ABNORMAL LOW (ref 1.15–1.40)
HCT: 36 % — ABNORMAL LOW (ref 39.0–52.0)
Hemoglobin: 12.2 g/dL — ABNORMAL LOW (ref 13.0–17.0)
O2 Saturation: 96 %
Patient temperature: 99.7
Potassium: 3.4 mmol/L — ABNORMAL LOW (ref 3.5–5.1)
Sodium: 143 mmol/L (ref 135–145)
TCO2: 26 mmol/L (ref 22–32)
pCO2 arterial: 48.7 mmHg — ABNORMAL HIGH (ref 32–48)
pH, Arterial: 7.312 — ABNORMAL LOW (ref 7.35–7.45)
pO2, Arterial: 90 mmHg (ref 83–108)

## 2021-10-28 LAB — CK: Total CK: 235 U/L (ref 49–397)

## 2021-10-28 LAB — TROPONIN I (HIGH SENSITIVITY)
Troponin I (High Sensitivity): 6 ng/L (ref <18)
Troponin I (High Sensitivity): 7 ng/L (ref <18)

## 2021-10-28 LAB — HIV ANTIBODY (ROUTINE TESTING W REFLEX): HIV Screen 4th Generation wRfx: NONREACTIVE

## 2021-10-28 LAB — ETHANOL: Alcohol, Ethyl (B): <10 mg/dL (ref <10)

## 2021-10-28 LAB — SALICYLATE LEVEL: Salicylate Lvl: <7 mg/dL — ABNORMAL LOW (ref 7.0–30.0)

## 2021-10-28 LAB — AMMONIA: Ammonia: 28 umol/L (ref 9–35)

## 2021-10-28 LAB — CBG MONITORING, ED: Glucose-Capillary: 179 mg/dL — ABNORMAL HIGH (ref 70–99)

## 2021-10-28 LAB — LACTIC ACID, PLASMA: Lactic Acid, Venous: 1.7 mmol/L (ref 0.5–1.9)

## 2021-10-28 MED ORDER — THIAMINE HCL 100 MG/ML IJ SOLN
500.0000 mg | Freq: Three times a day (TID) | INTRAVENOUS | Status: DC
  Filled 2021-10-28 (×3): qty 5

## 2021-10-28 MED ORDER — HALOPERIDOL LACTATE 5 MG/ML IJ SOLN
1.0000 mg | Freq: Once | INTRAMUSCULAR | Status: AC | PRN
  Administered 2021-10-28: 1 mg via INTRAVENOUS
  Filled 2021-10-28: qty 1

## 2021-10-28 MED ORDER — NALOXONE HCL 2 MG/2ML IJ SOSY
2.0000 mg | PREFILLED_SYRINGE | Freq: Once | INTRAMUSCULAR | Status: AC
Start: 2021-10-28 — End: 2021-10-28
  Administered 2021-10-28: 2 mg via INTRAVENOUS

## 2021-10-28 MED ORDER — SODIUM CHLORIDE 0.9 % IV SOLN: INTRAVENOUS | Status: AC

## 2021-10-28 MED ORDER — POTASSIUM CHLORIDE 10 MEQ/100ML IV SOLN
10.0000 meq | INTRAVENOUS | Status: AC
  Administered 2021-10-28 (×4): 10 meq via INTRAVENOUS
  Filled 2021-10-28 (×4): qty 100

## 2021-10-28 MED ORDER — HALOPERIDOL LACTATE 5 MG/ML IJ SOLN
2.5000 mg | Freq: Once | INTRAMUSCULAR | Status: AC
  Administered 2021-10-28: 2.5 mg via INTRAVENOUS
  Filled 2021-10-28: qty 1

## 2021-10-28 MED ORDER — NALOXONE HCL 2 MG/2ML IJ SOSY
PREFILLED_SYRINGE | INTRAMUSCULAR | Status: AC
  Filled 2021-10-28: qty 2

## 2021-10-28 MED ORDER — MAGNESIUM SULFATE 2 GM/50ML IV SOLN
2.0000 g | Freq: Once | INTRAVENOUS | Status: AC
  Administered 2021-10-28: 2 g via INTRAVENOUS
  Filled 2021-10-28: qty 50

## 2021-10-28 MED ORDER — CHLORHEXIDINE GLUCONATE CLOTH 2 % EX PADS
6.0000 | MEDICATED_PAD | Freq: Every day | CUTANEOUS | Status: DC
  Administered 2021-10-28 – 2021-11-08 (×12): 6 via TOPICAL

## 2021-10-28 MED ORDER — SODIUM CHLORIDE 0.9 % IV SOLN
3.0000 g | Freq: Four times a day (QID) | INTRAVENOUS | Status: DC
  Administered 2021-10-28 – 2021-10-31 (×11): 3 g via INTRAVENOUS
  Filled 2021-10-28 (×11): qty 8

## 2021-10-28 MED ORDER — THIAMINE HCL 100 MG/ML IJ SOLN
500.0000 mg | Freq: Three times a day (TID) | INTRAVENOUS | Status: AC
  Administered 2021-10-28 – 2021-10-30 (×6): 500 mg via INTRAVENOUS
  Filled 2021-10-28 (×8): qty 5

## 2021-10-28 MED ORDER — ENOXAPARIN SODIUM 40 MG/0.4ML IJ SOSY
40.0000 mg | PREFILLED_SYRINGE | INTRAMUSCULAR | Status: DC
  Administered 2021-10-29: 40 mg via SUBCUTANEOUS
  Filled 2021-10-28: qty 0.4

## 2021-10-28 MED ORDER — LORAZEPAM 1 MG PO TABS: 1.0000 mg | ORAL_TABLET | ORAL | Status: DC | PRN

## 2021-10-28 MED ORDER — SODIUM CHLORIDE 0.9 % IV SOLN
260.0000 mg | Freq: Once | INTRAVENOUS | Status: AC
  Administered 2021-10-28: 260 mg via INTRAVENOUS
  Filled 2021-10-28: qty 2

## 2021-10-28 MED ORDER — LACTATED RINGERS IV BOLUS
1000.0000 mL | Freq: Once | INTRAVENOUS | Status: AC
Start: 2021-10-28 — End: 2021-10-28
  Administered 2021-10-28: 1000 mL via INTRAVENOUS

## 2021-10-28 MED ORDER — LORAZEPAM 2 MG/ML IJ SOLN
0.5000 mg | Freq: Once | INTRAMUSCULAR | Status: AC
  Administered 2021-10-28: 0.5 mg via INTRAVENOUS
  Filled 2021-10-28: qty 1

## 2021-10-28 MED ORDER — LACTATED RINGERS IV BOLUS
1000.0000 mL | Freq: Once | INTRAVENOUS | Status: AC
  Administered 2021-10-28: 1000 mL via INTRAVENOUS

## 2021-10-28 MED ORDER — LORAZEPAM 2 MG/ML IJ SOLN
1.0000 mg | INTRAMUSCULAR | Status: DC | PRN
  Administered 2021-10-28: 2 mg via INTRAVENOUS
  Filled 2021-10-28: qty 1

## 2021-10-28 MED ORDER — DEXMEDETOMIDINE HCL IN NACL 400 MCG/100ML IV SOLN
0.2000 ug/kg/h | INTRAVENOUS | Status: DC
  Administered 2021-10-28: 0.2 ug/kg/h via INTRAVENOUS
  Administered 2021-10-29: 0.6 ug/kg/h via INTRAVENOUS
  Filled 2021-10-28 (×2): qty 100

## 2021-10-28 NOTE — Progress Notes (Signed)
eLink Physician-Brief Progress Note ?Patient Name: Nicholas Estes ?DOB: [birth year] ?MRN: [id number] ? ? ?Date of Service ? 10/28/2021  ?HPI/Events of Note ? On 5L [place] unable to get pulse ox reading in ED. Nursing requesting ABG   ?eICU Interventions ? Plan: ?ABG STAT.  ? ? ? ?Intervention Category ?Major Interventions: Other: ? ?Fizza Scales Cornelia Copa ?10/28/2021, 11:18 PM ?

## 2021-10-28 NOTE — ED Notes (Signed)
Pt w extreme episode of agitation; no PRN IV medication. This RN paged admitting MD, to come reassess pt. No new orders at this time ?

## 2021-10-28 NOTE — Progress Notes (Signed)
Received page from nurse. Patient's agitation improved with Ativan administration, but blood pressures did drop to softer measurements with several repeat checks. Requested manual BP, which was improved to 110/60 with a MAP of 77. Patient was somewhat agitated during this reading, which may have elevated the measurement. At this time, we will continue to closely monitor, if worsening agitation they can page back for further management. If increasing agitation and decreased pressures, low threshold to contact CCM. ? ? ?Nicholas Maloof, DO  ?

## 2021-10-28 NOTE — ED Notes (Signed)
Paged admitting d/t pt BP 60/41 ?

## 2021-10-28 NOTE — ED Notes (Signed)
Notified admitting team of pt manual BP ?

## 2021-10-28 NOTE — Progress Notes (Addendum)
FPTS Interim Progress Note ? ?S: Notified by nurse that patient yelling and agitated. Went to bedside alongside Dr. Joelyn Oms. Patient has soft restraints but is thrashing in bed, screaming and significantly agitated as he attempts to get out of bed consistently.  ? ?O: ?BP (!) 96/56   Pulse 93   Temp 98.2 ?F (36.8 ?C) (Axillary)   Resp 18   Ht 5\' 11"  (1.803 m)   Wt 107.3 kg   SpO2 97%   BMI 32.99 kg/m?   ?General: Patient in bed with soft restraints and mittens, attempting to get out of bed, in moderate to severe distress. ?Psych: very agitated  ? ?A/P: ?Given patient's severe agitation and prior hypotension, want to avoid Ativan. Ordered IV phenobarbital 260 mg per ICU recommendation earlier. CIWA >20. Called CCM and spoke with Dr. Milon Dikes who agrees to evaluate patient for possible ICU admission given severe agitation and possible need to initiate precedex. Appreciate assistance and recommendations of CCM team.  ? Donney Dice, DO ?10/28/2021, 10:00 PM ?PGY-2, [place] ?Service pager [phone]  ?

## 2021-10-28 NOTE — ED Notes (Signed)
Admitting MD and Critical Care providers at bedside ?

## 2021-10-28 NOTE — ED Notes (Signed)
Per MD continue to monitor pt and if agitated page them back to reassess withdrawal treatment options. ?

## 2021-10-28 NOTE — Progress Notes (Signed)
FPTS Interim Progress Note ? ?S:Went to bedside to check on patient alongside Dr. Joelyn Oms shortly after sign out. Sitter also at bedside. Patient resting comfortably, I did not wake the patient.  ? ?O: ?BP (!) 96/56   Pulse 93   Temp 98.2 ?F (36.8 ?C) (Axillary)   Resp 18   Ht 5\' 11"  (1.803 m)   Wt 107.3 kg   SpO2 97%   BMI 32.99 kg/m?   ?General: Patient resting comfortably, in no acute distress. ?Resp: normal work of breathing  ?Psych: mood appropriate  ? ?A/P: ?Reassuringly patient's agitation significantly improved. Discussed plan extensively with ED nurse and sitter. Concerned for possible history of heart failure so scheduled fluids to stop at 7 am 4/12. Consider echo in the morning but do not want to bother patient during the night. Notified nurse to inform primary team before administering Ativan unless urgent situation then please notify us immediately after. Will plan to touch base with ICU again if needing to start phenoibarbital for concern of ativan decreasing blood pressure. Vitals, orders and telemetry reviewed. Will continue to monitor closely.  ? Donney Dice, DO ?10/28/2021, 8:42 PM ?PGY-2, [place] ?Service pager [phone]  ?

## 2021-10-28 NOTE — ED Notes (Signed)
Pt transported back from CT by RN. Transport uneventful and VSS. ?

## 2021-10-28 NOTE — ED Provider Notes (Signed)
?[facility] EMERGENCY DEPARTMENT ?Provider Note ? ?CSN: [id number] ?Arrival date & time:   ? ?Chief Complaint(s) ?Altered Mental Status ? ?HPI ?Nicholas Estes is a 61 y.o. male with PMH A-fib, seizure disorder, previous CVA, HTN who presents emergency department for evaluation of altered mental status.  History limited and EMS states that the patient was found with empty pill bottles.  The empty pill bottles in question include oxcarbazepine which was filled in November 2022 and atorvastatin.  Patient arrives encephalopathic and hypoxic on room air, arrives saturating 100% on a nonrebreather.  Patient transition to nasal cannula on arrival and saturating 94% on nasal cannula.  Additional history unable to be obtained as the patient is altered. ? ? ?Altered Mental Status ? ?Past Medical History ?Past Medical History:  ?Diagnosis Date  ? Atrial fibrillation (HCC)   ? Hypertension   ? 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 ?Patient Active Problem List  ? Diagnosis Date Noted  ? Encephalopathy 10/28/2021  ? Left-sided weakness   ? Slurred speech   ? Oropharyngeal dysphagia   ? 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 Stroke (HCC) 11/04/2019  ? ?Home Medication(s) ?Prior to Admission medications   ?Medication Sig Start Date End Date Taking? Authorizing Provider  ?acetaminophen (TYLENOL) 325 MG tablet Take 2 tablets (650 mg total) by mouth every 4 (four) hours as needed for mild pain (or temp > 37.5 C (99.5 F)). 11/15/19   Metzger-Cihelka, Desiree, NP  ?albuterol (PROVENTIL) (2.5 MG/3ML) 0.083% nebulizer solution Take 3 mLs (2.5 mg total) by nebulization every 4 (four) hours as needed for wheezing or shortness of breath. 11/15/19   Metzger-Cihelka, Cristie Hem, NP  ?apixaban (ELIQUIS) 5 MG TABS tablet Take 1 tablet (5 mg total) by mouth 2 (two) times daily. 11/15/19   Metzger-Cihelka, Desiree, NP  ?aspirin EC 81 MG tablet Take 81 mg by mouth daily. Swallow whole.    [provider]  ?buPROPion (WELLBUTRIN  XL) 150 MG 24 hr tablet Take 150 mg by mouth daily. 04/14/21   [provider]  ?cephALEXin (KEFLEX) 500 MG capsule Take 1 capsule (500 mg total) by mouth 3 (three) times daily. 08/05/21   Tilden Fossa, MD  ?folic acid (FOLVITE) 1 MG tablet Take 1 tablet (1 mg total) by mouth daily. 11/16/19   Metzger-Cihelka, Desiree, NP  ?furosemide (LASIX) 40 MG tablet Take 40 mg by mouth daily. 07/11/21   [provider]  ?gabapentin (NEURONTIN) 300 MG capsule Take 1 capsule (300 mg total) by mouth 3 (three) times daily. ?Patient not taking: Reported on 08/05/2021 11/15/19   Metzger-Cihelka, Cristie Hem, NP  ?midodrine (PROAMATINE) 5 MG tablet Take 1 tablet (5 mg total) by mouth every 8 (eight) hours. ?Patient not taking: Reported on 08/05/2021 11/15/19   Metzger-Cihelka, Cristie Hem, NP  ?OXcarbazepine (TRILEPTAL) 150 MG tablet Take 150 mg by mouth daily. Take one half tab by mouth every morning and noon    [provider]  ?Oxcarbazepine (TRILEPTAL) 300 MG tablet Take 300 mg by mouth 2 (two) times daily. **also 3 tablets at bedtime** 04/14/21   [provider]  ?potassium chloride (MICRO-K) 10 MEQ CR capsule TAKE ONE CAPSULE BY MOUTH DAILY (TAKE WITH FOOD) ?Patient not taking: Reported on 08/05/2021 12/26/20   [provider]  ?tamsulosin (FLOMAX) 0.4 MG CAPS capsule Take 1 capsule by mouth daily. 07/11/21   [provider]  ?thiamine 100 MG tablet Take 1 tablet (100 mg total) by mouth daily. ?Patient not taking: Reported on 08/05/2021 11/16/19  Metzger-Cihelka, York Cerise, NP  ?                                                                                                                                  ?Past Surgical History ?Past Surgical History:  ?Procedure Laterality Date  ? IR CT HEAD LTD  11/04/2019  ? IR CT HEAD LTD  11/04/2019  ? IR PERCUTANEOUS ART THROMBECTOMY/INFUSION INTRACRANIAL INC DIAG ANGIO  11/04/2019  ? IR US GUIDE VASC ACCESS RIGHT  11/04/2019  ? RADIOLOGY WITH ANESTHESIA  N/A 11/04/2019  ? Procedure: IR WITH ANESTHESIA;  Surgeon: Luanne Bras, MD;  Location: [place];  Service: Radiology;  Laterality: N/A;  ? ?Family History ?History reviewed. No pertinent family history. ? ?Social History ?Social History  ? ?Tobacco Use  ? Smoking status: Unknown  ? ?Allergies ?Patient has no known allergies. ? ?Review of Systems ?Review of Systems  ?Unable to perform ROS: Mental status change  ? ?Physical Exam ?Vital Signs  ?I have reviewed the triage vital signs ?BP 103/67   Pulse 72   Temp (!) 97.2 ?F (36.2 ?C) (Oral)   Resp 16   Ht 5\' 11"  (1.803 m)   Wt 107.3 kg   SpO2 94%   BMI 32.99 kg/m?  ? ?Physical Exam ?Vitals and nursing note reviewed.  ?Constitutional:   ?   General: He is not in acute distress. ?   Appearance: He is well-developed. He is ill-appearing and diaphoretic.  ?HENT:  ?   Head: Normocephalic and atraumatic.  ?Eyes:  ?   Conjunctiva/sclera: Conjunctivae normal.  ?Cardiovascular:  ?   Rate and Rhythm: Normal rate and regular rhythm.  ?   Heart sounds: No murmur heard. ?Pulmonary:  ?   Effort: Pulmonary effort is normal. No respiratory distress.  ?   Breath sounds: Normal breath sounds.  ?Abdominal:  ?   Palpations: Abdomen is soft.  ?   Tenderness: There is no abdominal tenderness.  ?Musculoskeletal:     ?   General: No swelling.  ?   Cervical back: Neck supple.  ?Skin: ?   General: Skin is warm.  ?   Capillary Refill: Capillary refill takes less than 2 seconds.  ?Neurological:  ?   Mental Status: He is disoriented.  ?Psychiatric:     ?   Mood and Affect: Mood normal.  ? ? ?ED Results and Treatments ?Labs ?(all labs ordered are listed, but only abnormal results are displayed) ?Labs Reviewed  ?URINALYSIS, ROUTINE W REFLEX MICROSCOPIC - Abnormal; Notable for the following components:  ?    Result Value  ? Color, Urine STRAW (*)   ? Hgb urine dipstick MODERATE (*)   ? Ketones, ur 5 (*)   ? Leukocytes,Ua TRACE (*)   ? Bacteria, UA FEW (*)   ? All other components within  normal limits  ?COMPREHENSIVE METABOLIC PANEL - Abnormal; Notable for the following components:  ? Chloride 97 (*)   ?  Glucose, Bld 159 (*)   ? Total Bilirubin 1.5 (*)   ? All other components within normal limits  ?CBC WITH DIFFERENTIAL/PLATELET - Abnormal; Notable for the following components:  ? WBC 14.8 (*)   ? Neutro Abs 12.1 (*)   ? All other components within normal limits  ?SALICYLATE LEVEL - Abnormal; Notable for the following components:  ? Salicylate Lvl [id number] (*)   ? All other components within normal limits  ?ACETAMINOPHEN LEVEL - Abnormal; Notable for the following components:  ? Acetaminophen (Tylenol), Serum <10 (*)   ? All other components within normal limits  ?RAPID URINE DRUG SCREEN, HOSP PERFORMED - Abnormal; Notable for the following components:  ? Tetrahydrocannabinol POSITIVE (*)   ? All other components within normal limits  ?CBG MONITORING, ED - Abnormal; Notable for the following components:  ? Glucose-Capillary 179 (*)   ? All other components within normal limits  ?I-STAT VENOUS BLOOD GAS, ED - Abnormal; Notable for the following components:  ? Bicarbonate 29.4 (*)   ? Acid-Base Excess 4.0 (*)   ? Potassium 5.2 (*)   ? Calcium, Ion 1.00 (*)   ? All other components within normal limits  ?LACTIC ACID, PLASMA  ?AMMONIA  ?ETHANOL  ?BLOOD GAS, VENOUS  ?HIV ANTIBODY (ROUTINE TESTING W REFLEX)  ?10-HYDROXYCARBAZEPINE  ?COMPREHENSIVE METABOLIC PANEL  ?CK  ?ACETAMINOPHEN LEVEL  ?TROPONIN I (HIGH SENSITIVITY)  ?TROPONIN I (HIGH SENSITIVITY)  ?                                                                                                                       ? ?Radiology ?CT Head Wo Contrast ? ?Result Date: 10/28/2021 ?CLINICAL DATA:  Delirium EXAM: CT HEAD WITHOUT CONTRAST TECHNIQUE: Contiguous axial images were obtained from the base of the skull through the vertex without intravenous contrast. RADIATION DOSE REDUCTION: This exam was performed according to the departmental dose-optimization  program which includes automated exposure control, adjustment of the mA and/or kV according to patient size and/or use of iterative reconstruction technique. COMPARISON:  CT head 08/04/2021 FINDINGS: Brain: Chronic inf

## 2021-10-28 NOTE — ED Notes (Signed)
Pt agitated and thrashing in bed. Pt arching off of bed and screaming. Pt not redirectable.  ?

## 2021-10-28 NOTE — ED Notes (Signed)
Pt was given medicine and slept for 1 full hour. Pt's BP was taken by RN and Clinical research associate noticed that pt's BP was extremely low. Writer grabbed Charity fundraiser, and pt became awake again. Pt was hollering, thrashing their body and would yell out not making any sense. NT on floor attempted to get pt's BP manually, was successful after 2 attempt. Pt continued to thrash around till second time of medicine. Writer had become aware that pt had a dry, bloody mouth. Dry blood covered pt's tongue, upper mouth and teeth. RN made aware, writer washed up pt's face due to dry blood on cheek. Pt is still hot on touch, writer had a fan ordered and delivered so pt can cool down. Pt currently back asleep, writer at a safe distance from pt.  ?

## 2021-10-28 NOTE — ED Notes (Signed)
Providers at bedside to assess pt. Pt increased agitation and thrashing around in bed. Pt is talking, but confused.  ?

## 2021-10-28 NOTE — ED Notes (Signed)
RN transporting pt to Ct at this time ?

## 2021-10-28 NOTE — ED Notes (Signed)
Called staffing and updated them about updated safety observation order. Called tele-sitter to discontinue d/t pt not appropriate for that monitoring level at this time. Staffing will send someone when available, in the meantime EDT is sitting with pt for safety. ?

## 2021-10-28 NOTE — ED Triage Notes (Signed)
Pt arrived to ED via [facility] w/ c/o AMS. Pt's friends who he lives with found him this morning sitting in a chair altered. They witnessed him then vomit thick white content. Hx of taking excessive amount of tums. Pt had meds found beside him w/ 2 empty bottles, unknown how much was in them or if he ingested any. EMS reported that his med bottles are usually placed on a high shelf where can't get them and that his friends fill his weekly med container. Pt A&Ox4 at baseline. EMS reports pt w/ intermittent lethargy to the point they were bagging him w/ intermittent episodes of combativeness. EMS placed pt on NRB at 10L for O2 sat of 90%. VS BP 140/80, HR 80 irregular a-fib, capnography 35-45, RR 14-14 while resting and 20-22 while more responsive.18g R AC. ?

## 2021-10-28 NOTE — Consult Note (Addendum)
? ?NAME:  Nicholas Estes, MRN:  [id number], DOB:  10-18-1960, LOS: 0 ?ADMISSION DATE:  10/28/2021, CONSULTATION DATE:  4/11 ?REFERRING MD:  FMTS , REASON FOR CONSULT: Agitation and hypotension  ? ?History of Present Illness:  ?Patient is encephalopathic and/or intubated. Therefore history has been obtained from chart review.  ? ?Nicholas Estes, is a 61 y.o. male, who presented to the [facility] ED with a chief complaint of Altered mental status ? ?They have a pertinent past medical history of ETOH abuse, afib, HTN, stroke, tobacco use, and conversion disorder, non epileptiform spells. ? ?Prior to arrival, he was reportedly visited by a friend and found altered with vomit near the patient. EMS reports of empty med bottles of oxcarbazepine and atorvastatin.  ? ?The patient was admitted to the [facility] service. No acute changes on head CT. Poison control contacted in the ED. The patient developed agitation while in the ED. The were given 2.5 of haldol and 2 mg of ativan. They then developed on isolated episode of hypotension (60/41). 1 L of IVF was given. ? ?PCCM was consulted for evaluation of hypotension and agitation. ? ?Pertinent  Medical History  ?Etoh use disorder ?Mood disorder ?Substance abuse ?HFpEF ?Tobacco use  ? ?Significant Hospital Events: ?Including procedures, antibiotic start and stop dates in addition to other pertinent events   ?4/11 ED via EMS for AMS. Admit to FPTS with concern for Dts. PCCM consult for agitation + medication related hypotension  ? ?Interim History / Subjective:  ?Received additional 0.5mg  ativan ? ?SBPs are 100s  ? ?Looks like had transient hypotension after 2mg  ativan earlier this afternoon ? ?Objective   ?Blood pressure 122/68, pulse (!) 124, temperature (!) 97.2 ?F (36.2 ?C), temperature source Oral, resp. rate (!) 31, height 5\' 11"  (1.803 m), weight 107.3 kg, SpO2 96 %. ?   ?   ? ?Intake/Output Summary (Last 24 hours) at 10/28/2021 1750 ?Last data filed at 10/28/2021 1438 ?Gross per 24 hour   ?Intake 1000 ml  ?Output 600 ml  ?Net 400 ml  ? ?Filed Weights  ? 10/28/21 1028  ?Weight: 107.3 kg  ? ? ?Examination: ?General: Ill appearing obese M  ?HENT: NCAT. Dry lips. Tacky mm. Scant dried blood on lips ?Lungs: Intermittent snoring. Even and unlabored  ?Cardiovascular: tachycardic, irregular ?Abdomen: obese soft ndnt ?Extremities: no acute deformity. No edema ?Neuro: Awakens to noxious stimuli. PERRL 19mm. No hyperreflexia  ?GU: defer  ? ?Resolved Hospital Problem list   ? ? ?Assessment & Plan:  ? ?Acute metabolic encephalopathy with agitated delirium  ?-hx EtOH abuse, THC abuse, Mood disorder, conversion disorder ?-does take a few psych meds at home (wellbutrin, oxcarbazepine) -- but exam is not consistent with typical toxicity for these meds ?-looks more like etoh withdrawal -- has had 1mg  haldol + 1.5 mg haldol + 2mg  ativan + 0.5 mg ativan total. Responds well to ativan but had a drop in pressure after 2mg .  ?P ?-do not think at this point ICU transfer or precedex is indicated. protecting airway at this time  ?-would recommend continuing with ativan and optimize volume status ?-phenobarb could be a consideration as well, at this time I do think ativan is fine.  ?-agree with Bvitamin and micronutrient support as you are  ?-cont to follow qtc, electrolytes & while of lower suspicion cont to monitor for s/sx med overdose ?-agree with checking 10-hydroxycarbazepine  ?( ?-RE phenobarb -- this can be used outside of ICU for Dts depending on provider comfort level. Phenobarb for DTs  often is 130mg  PO q1hr x 2 doses (or 260mg  IV x1 dose if unable to take POs) followed by 100mg  PO TID x6, 60mg  PO TID x6, 30mg  TID x6  ?-if phenobarb is felt to be indicated and primary team is not comfortable with this we are happy to provide further guidance) ? ?Medication related hypotension ?-transient hypotension after ativan admin. He appears quite dry, hemodynamics likely sensitive in setting of hypovolemia. Hypovolemia would  also fit with picture of suspected etoh withdrawal. ?P ?-addition 1L LR bolus now ?-mIVf ok, but for hemodynamics, more effective to bolus as needed & greatest impact on CO would be via pressure bag  ? ?Possible aspiration event ?Leukocytosis ?-WBC is 14, CXR without obvious patchy opacity but lung volumes are low and could be confounded by atelectasis  ?-afebrile ?P ?-will add unasyn  ? ?AKI ?P ?-fluids as above ?-trend renal indices UOP ? ?Hypokalemia ?Hypomagnesemia ?-replace ? ? ?We will sign off at this time. Please let us know if the patient's clinical status changes or if we can be of further assistance.  ? ?Best Practice (right click and "Reselect all SmartList Selections" daily)  ? ?Diet/type: NPO ?DVT prophylaxis: LMWH ?GI prophylaxis: N/A ?Lines: N/A ?Foley:  N/A ?Code Status:  full code ?Last date of multidisciplinary goals of care discussion [per primary] ? ?Labs   ?CBC: ?Recent Labs  ?Lab 10/28/21 ?1016 10/28/21 ?1025  ?WBC 14.8*  --   ?NEUTROABS 12.1*  --   ?HGB 15.2 15.3  ?HCT 43.9 45.0  ?MCV 96.9  --   ?PLT 235  --   ? ? ?Basic Metabolic Panel: ?Recent Labs  ?Lab 10/28/21 ?1016 10/28/21 ?1025  ?NA 136 135  ?K 4.1 5.2*  ?CL 97*  --   ?CO2 25  --   ?GLUCOSE 159*  --   ?BUN 12  --   ?CREATININE 1.09  --   ?CALCIUM 9.0  --   ? ?GFR: ?Estimated Creatinine Clearance: 88.7 mL/min (by C-G formula based on SCr of 1.09 mg/dL). ?Recent Labs  ?Lab 10/28/21 ?1016  ?WBC 14.8*  ?LATICACIDVEN 1.7  ? ? ?Liver Function Tests: ?Recent Labs  ?Lab 10/28/21 ?1016  ?AST 24  ?ALT 23  ?ALKPHOS 96  ?BILITOT 1.5*  ?PROT 7.3  ?ALBUMIN 4.1  ? ?No results for input(s): LIPASE, AMYLASE in the last 168 hours. ?Recent Labs  ?Lab 10/28/21 ?1016  ?AMMONIA 28  ? ? ?ABG ?   ?Component Value Date/Time  ? PHART 7.374 11/04/2019 1310  ? PCO2ART 39.5 11/04/2019 1310  ? PO2ART 77.0 (L) 11/04/2019 1310  ? HCO3 29.4 (H) 10/28/2021 1025  ? TCO2 31 10/28/2021 1025  ? ACIDBASEDEF 2.0 11/04/2019 1310  ? O2SAT 68 10/28/2021 1025  ?  ? ?Coagulation  Profile: ?No results for input(s): INR, PROTIME in the last 168 hours. ? ?Cardiac Enzymes: ?No results for input(s): CKTOTAL, CKMB, CKMBINDEX, TROPONINI in the last 168 hours. ? ?HbA1C: ?Hgb A1c MFr Bld  ?Date/Time Value Ref Range Status  ?11/05/2019 05:52 AM 6.0 (H) 4.8 - 5.6 % Final  ?  Comment:  ?  (NOTE) ?Pre diabetes:          5.7%-6.4% ?Diabetes:              >6.4% ?Glycemic control for   <7.0% ?adults with diabetes ?  ? ? ?CBG: ?Recent Labs  ?Lab 10/28/21 ?1021  ?GLUCAP 179*  ? ? ?Review of Systems:   ?Unable to obtain, encephalopathy  ? ?Past Medical History:  ?He,  has a past medical history of Atrial fibrillation ([place]), Hypertension, and Stroke (cerebrum) ([place]) (11/04/2019).  ? ?Surgical History:  ? ?Past Surgical History:  ?Procedure Laterality Date  ? IR CT HEAD LTD  11/04/2019  ? IR CT HEAD LTD  11/04/2019  ? IR PERCUTANEOUS ART THROMBECTOMY/INFUSION INTRACRANIAL INC DIAG ANGIO  11/04/2019  ? IR US GUIDE VASC ACCESS RIGHT  11/04/2019  ? RADIOLOGY WITH ANESTHESIA N/A 11/04/2019  ? Procedure: IR WITH ANESTHESIA;  Surgeon: Luanne Bras, MD;  Location: [place];  Service: Radiology;  Laterality: N/A;  ?  ? ?Social History:  ?   ? ?Family History:  ?His family history is not on file.  ? ?Allergies ?No Known Allergies  ? ?Home Medications  ?Prior to Admission medications   ?Medication Sig Start Date End Date Taking? Authorizing Provider  ?acetaminophen (TYLENOL) 325 MG tablet Take 2 tablets (650 mg total) by mouth every 4 (four) hours as needed for mild pain (or temp > 37.5 C (99.5 F)). 11/15/19   Metzger-Cihelka, Desiree, NP  ?albuterol (PROVENTIL) (2.5 MG/3ML) 0.083% nebulizer solution Take 3 mLs (2.5 mg total) by nebulization every 4 (four) hours as needed for wheezing or shortness of breath. 11/15/19   Metzger-Cihelka, York Cerise, NP  ?apixaban (ELIQUIS) 5 MG TABS tablet Take 1 tablet (5 mg total) by mouth 2 (two) times daily. 11/15/19   Metzger-Cihelka, Desiree, NP  ?aspirin EC 81 MG tablet Take 81 mg by mouth  daily. Swallow whole.    [provider]  ?buPROPion (WELLBUTRIN XL) 150 MG 24 hr tablet Take 150 mg by mouth daily. 04/14/21   [provider]  ?cephALEXin (KEFLEX) 500 MG capsule Take

## 2021-10-28 NOTE — ED Notes (Signed)
Called staffing for sitter d/t pt behavior and AMS. Tele sitter being sent to monitor pt d/t not in person staff available at this time per staffing. CN aware ?

## 2021-10-28 NOTE — ED Notes (Signed)
Patient keeps waking up and asking for water and ice chips. NT states that he is NPO. Patient still continuing to ask.  ?

## 2021-10-28 NOTE — ED Notes (Signed)
Admitting team notified of pt BP and that LR bolus has been initiated ?

## 2021-10-28 NOTE — ED Notes (Addendum)
This RN was in another room when I heard the pt's bed alarm sounding. Tele sitter never sounded alarm for pt trying to get out of bed and or bed alarm going off. This RN never received a phone call to be notified of event. Staff that was sitting outside of the room had to intervene to keep pt from falling out of bed and pt was physically aggressive w/ staff. Tele-sitter never notified anyone of event. Restraint order obtained verbally by Kommor MD. Restraints applied. CN notified. Tele-sitter then called RN after everything to verify that bed alarm was on pt bed. Bed alarm is activated. Staff remains sitting outside of room to monitor pt. ?

## 2021-10-28 NOTE — ED Notes (Signed)
Poison control updated and want and ekg when pt is calm. Notified admitting team. Also notified admitting team that pt is very agitated, arching off of the bed and has dried blood in his mouth. ? ?

## 2021-10-28 NOTE — Progress Notes (Signed)
Pharmacy Antibiotic Note ? ?Nicholas Estes is a 61 y.o. male admitted on 10/28/2021 with  aspiration pneumonia .  Pharmacy has been consulted for Unasyn dosing. ? ?Plan: ?-Unasyn 3 gm IV Q 6 hours  ?-Monitor CBC, renal fx, cultures and clinical progress  ? ?Height: 5\' 11"  (180.3 cm) ?Weight: 107.3 kg (236 lb 8.9 oz) ?IBW/kg (Calculated) : 75.3 ? ?Temp (24hrs), Avg:97.2 ?F (36.2 ?C), Min:97.2 ?F (36.2 ?C), Max:97.2 ?F (36.2 ?C) ? ?Recent Labs  ?Lab 10/28/21 ?1016 10/28/21 ?1724  ?WBC 14.8*  --   ?CREATININE 1.09 1.33*  ?LATICACIDVEN 1.7  --   ?  ?Estimated Creatinine Clearance: 72.7 mL/min (A) (by C-G formula based on SCr of 1.33 mg/dL (H)).   ? ?No Known Allergies ? ?Antimicrobials this admission: ?Unasyn 4/11 >>  ? ? ?Dose adjustments this admission: ? ? ?Microbiology results: ? ? ?Thank you for allowing pharmacy to be a part of this patient?s care. ? ?6/11, PharmD., BCCCP ?Clinical Pharmacist ?Please refer to [facility] for unit-specific pharmacist  ? ?

## 2021-10-28 NOTE — ED Notes (Signed)
Tele sitter set up at this time.  

## 2021-10-28 NOTE — ED Notes (Signed)
Pt resting comfortably at this time.

## 2021-10-28 NOTE — ED Notes (Signed)
Called telesitter camera number and gave report to staff.  ?

## 2021-10-28 NOTE — H&P (Addendum)
Family Medicine Teaching Service ?Hospital Admission History and Physical ?Service Pager: [phone] ? ?Patient name: Nicholas Estes Medical record number: [id number] ?Date of birth: 1960-12-24 Age: 61 y.o. Gender: male ? ?Primary Care Provider: [facility] ?Consultants: Neurology ?Code Status: Full ?Preferred Emergency Contact: Cindy Alred? Denman George) [phone] ? ?Chief Complaint: Altered mental status ? ?Assessment and Plan: ?Nicholas Estes is a 61 y.o. male presenting with altered mental status . PMH is significant for alcohol abuse, tremors, afib on eliquis, stroke ? ?Acute Encephalopathy, Wernicke's vs Medication overdose ?Patient presented with altered mental status and thick white emesis. In the ER, waxing and waning mental status with intermittent agitation. In the ER was initially hypoxic requiring oxygen, received narcan x2 without improvement and haldol to obtain CT head, which was negative.  Labs without severe derangements. Negative alcohol, ammonia, tylenol and salicylate. UDS positive for THC. Given patient has empty bottle of trileptal and crestor, unclear if he had overdosed on them or spilled them. Differential includes psychiatric cause (see below), medication overdose, wernicke's encephalopathy (hx of alcohol abuse). Infectious etiology less likely given normal labs and afebrile with neg CXR. Patient aggressive and disoriented, will also need to prioritize staff safety while caring for the patient; currently in restraints.  ?- Admit to progressive observation, attending Dr. Owens Shark ?- Poison control notified, recommend supportive care at this time with electrolyte monitoring, will call back after 6pm labs result. ?- Vitals per routine ?- CIWA protocol with Ativan ?- 4 point restraints in place due to significant agitation ?- Delirium precautions ?- Fall precautions ?- 1:1 sitter ?- CMP, CK, mag, and repeat acetaminophen level at 1800 ?- Neurology consulted but does not suspect it is a  seizure, no EEG at this time ?- Thiamine Wernicke dosing (500mg  Q8H) x 2 days and re-evaluate dosing ?- Holding oral home medications due to encephalopathy, will add back as able ? ?Conversion Disorder  Hx of pseudoseizures ?[facility]. Unclear if there is an overdose component of presentation, home medications do include Wellbutrin XL 300mg  daily and Oxcarbazepine (600mg  twice daily, 900mg  before bed) which can cause sedation.  ?- Holding home meds in setting of encephalopathy ?- Needs outpatient psychiatric follow-up ? ?Leucocytosis ?WBC 14.8. Afebrile, VSS. Unclear etiology, no infectious source at this time, will continue to trend. Could be related to possible overdose. ?- AM CBC ? ?Presumed HFpEF?  ?Echo 10/2019 with normal EF, moderate asymmetry of the basal septal segment, mild dilation of b/l atria, mild dilation of ascending aorta. Home med: Lasix 40mg  daily, Metoprolol tartrate 25? mg BID, Klorcon 10 meq daily ?- Holding home medications until able to take PO ?- Consider echo. Last echo in 2021 per our records ? ?Urinary Incontinence ?Follows outpatient urology. Takes flomax at home ?- Hold Flomax until taking PO ? ?Smoking Cessation ?Takes bupropion for smoking cessation. Was tapered off nicotine patch last year ?- Holding Bupropion until taking PO ? ?Alcohol Abuse ?Marijuana Use ?History of alcohol abuse. Unable to take PO due to mental status. Ethanol<10. Concern for alcohol withdrawal worsening the presentation of altered mental status. ?- Thiamine 500mg  Q8H ?- Add folate when able to take oral, can start IV if continues to not tolerate PO ?- TOC for cessation counseling and resources ? 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 Follows outpatient neurology which per chart review has caused patient to have complete left homonymous hemianopsia. ?-  Holding home eliquis d/t mental  status, will add back as able ? ?Hypercholesterolemia ?Takes crestor 20mg  daily ?- Holding home crestor until able to take PO ? ?Atrial fibrillation ?Currently in afib with rate controlled. Unable to currently take oral medications. Home meds include Eliquis 5mg  BID, metoprolol 25mg ? BID, unclear if this is patient's beta blocker dose.  ?- Holding home eliquis and metoprolol d/t mental status ?- Holding home metoprolol until taking PO, can consider IV dosing if needed ? ?FEN/GI: NPO until mental status improves ?Prophylaxis: Lovenox to start 4/12 ?Disposition: Progressive ? ?History of Present Illness:  Nicholas Estes is a 61 y.o. male presenting with acute encephalopathy of unknown origin. ? ?Per friend, patient had spilled all of his medications everywhere 2 days ago after his partner Roxie left him. Patient has apparently been agitated since then. Denies having a seizure history or severe alcohol withdrawal. Does use a walker at home and has a CNA visit 4 times a week. ? ?Review Of Systems: Unable to complete due to altered mental status ? ?Review of Systems  ?Unable to perform ROS: Mental status change   ? ?Patient Active Problem List  ? Diagnosis Date Noted  ? Left-sided weakness   ? Slurred speech   ? Oropharyngeal dysphagia   ? Stroke (cerebrum) ([place]) 11/04/2019  ? Stroke [facility]) 11/04/2019  ? ? ?Past Medical History: ?Past Medical History:  ?Diagnosis Date  ? Atrial fibrillation ([place])   ? Hypertension   ? Stroke (cerebrum) ([place]) 11/04/2019  ? ? ?Past Surgical History: ?Past Surgical History:  ?Procedure Laterality Date  ? IR CT HEAD LTD  11/04/2019  ? IR CT HEAD LTD  11/04/2019  ? IR PERCUTANEOUS ART THROMBECTOMY/INFUSION INTRACRANIAL INC DIAG ANGIO  11/04/2019  ? IR US GUIDE VASC ACCESS RIGHT  11/04/2019  ? RADIOLOGY WITH ANESTHESIA N/A 11/04/2019  ? Procedure: IR WITH ANESTHESIA;  Surgeon: Luanne Bras, MD;  Location: [facility];  Service: Radiology;  Laterality: N/A;  ? ? ?Social History: ?Social History   ? ?Tobacco Use  ? Smoking status: Unknown  ? ?Additional social history:   ?Please also refer to relevant sections of EMR. ? ?Family History: ?History reviewed. No pertinent family history. ?Unable to obtain due to encephalopathy ? ?Allergies and Medications: ?No Known Allergies ?No current facility-administered medications on file prior to encounter.  ? ?Current Outpatient Medications on File Prior to Encounter  ?Medication Sig Dispense Refill  ? acetaminophen (TYLENOL) 325 MG tablet Take 2 tablets (650 mg total) by mouth every 4 (four) hours as needed for mild pain (or temp > 37.5 C (99.5 F)).    ? albuterol (PROVENTIL) (2.5 MG/3ML) 0.083% nebulizer solution Take 3 mLs (2.5 mg total) by nebulization every 4 (four) hours as needed for wheezing or shortness of breath. 75 mL 12  ? apixaban (ELIQUIS) 5 MG TABS tablet Take 1 tablet (5 mg total) by mouth 2 (two) times daily. 60 tablet 0  ? aspirin EC 81 MG tablet Take 81 mg by mouth daily. Swallow whole.    ? buPROPion (WELLBUTRIN XL) 150 MG 24 hr tablet Take 150 mg by mouth daily.    ? cephALEXin (KEFLEX) 500 MG capsule Take 1 capsule (500 mg total) by mouth 3 (three) times daily. 21 capsule 0  ? folic acid (FOLVITE) 1 MG tablet Take 1 tablet (1 mg total) by mouth daily.    ? furosemide (LASIX) 40 MG tablet Take 40 mg by mouth daily.    ? gabapentin (NEURONTIN) 300 MG capsule  Take 1 capsule (300 mg total) by mouth 3 (three) times daily. (Patient not taking: Reported on 08/05/2021) 30 capsule 1  ? midodrine (PROAMATINE) 5 MG tablet Take 1 tablet (5 mg total) by mouth every 8 (eight) hours. (Patient not taking: Reported on 08/05/2021) 30 tablet 0  ? OXcarbazepine (TRILEPTAL) 150 MG tablet Take 150 mg by mouth daily. Take one half tab by mouth every morning and noon    ? Oxcarbazepine (TRILEPTAL) 300 MG tablet Take 300 mg by mouth 2 (two) times daily. **also 3 tablets at bedtime**    ? potassium chloride (MICRO-K) 10 MEQ CR capsule TAKE ONE CAPSULE BY MOUTH DAILY (TAKE  WITH FOOD) (Patient not taking: Reported on 08/05/2021)    ? tamsulosin (FLOMAX) 0.4 MG CAPS capsule Take 1 capsule by mouth daily.    ? thiamine 100 MG tablet Take 1 tablet (100 mg total) by mouth daily. (Patient not t

## 2021-10-28 NOTE — Progress Notes (Signed)
FPTS Interim Progress Note ? ?S:Called to bedside by nursing due to significant agitation. Patient has been reaching out and yelling in the ER needing further evaluation. There is concern given the fact that the earlier 2mg  of Ativan caused a decrease in the blood pressure. The ativan dosing also only lasted about 1 hour for keeping him calm.  ? ?O: ?BP 106/60   Pulse (!) 112   Temp (!) 97.2 ?F (36.2 ?C) (Oral)   Resp (!) 21   Ht 5\' 11"  (1.803 m)   Wt 107.3 kg   SpO2 97%   BMI 32.99 kg/m?   ?General: agitated, laying in bed, nursing at bedside  ?CV: Tachycardic on telemetry ?HEENT: dried blood on the top of in the back of the oral palate, dried blood on the left cheek as well.  No signs of active bleeding at this time. ? ?A/P: ?Encephalopathy with agitated delirium ?ICU was consulted, recommendations are in their note.  Low threshold to contact again if not able to control blood pressure. ?- Closely monitor blood pressure closely ?- Giving IV fluid boluses and increasing maintenance fluids to 125 mL/h ?- CCM adding antibiotic coverage in case of aspiration ? ?Rise Patience, DO ?10/28/2021, 6:04 PM ?PGY-2, [place] ?Service pager [phone] ? ?

## 2021-10-28 NOTE — ED Notes (Signed)
Pt highly agitated, screaming, not redirectable. Unable to obtain automatic bp due to extreme agitation  ?

## 2021-10-28 NOTE — ED Notes (Signed)
Writer walked in to be Recruitment consultant, pt was yelling and throwing his hands around. Pt was given per report that pt has been that way for 20 minutes. When writer touched pt to pull him up, pt was burning up. Pt is going through withdrawal, writer asked if ice packs could be applied onto pt. Writer could visibly see sweat, pt burning up and pt warm to touch as well as pt stating "give me ice cubes!!!". 2 Ice packs applies, 1 on his neck and 1 on his chest. Seizure pads also applied due to pt banging his hands and head onto the bedside rail. RN was in room and is aware of pt's aggression. Was also said in report that pt has been physically aggressive, writer will maintain a safe distance from pt.  ?

## 2021-10-29 ENCOUNTER — Inpatient Hospital Stay (HOSPITAL_COMMUNITY)

## 2021-10-29 DIAGNOSIS — G9340 Encephalopathy, unspecified: Secondary | ICD-10-CM | POA: Diagnosis not present

## 2021-10-29 DIAGNOSIS — J9601 Acute respiratory failure with hypoxia: Secondary | ICD-10-CM | POA: Diagnosis not present

## 2021-10-29 DIAGNOSIS — R4182 Altered mental status, unspecified: Secondary | ICD-10-CM | POA: Diagnosis not present

## 2021-10-29 DIAGNOSIS — F10931 Alcohol use, unspecified with withdrawal delirium: Secondary | ICD-10-CM | POA: Diagnosis not present

## 2021-10-29 DIAGNOSIS — J9602 Acute respiratory failure with hypercapnia: Secondary | ICD-10-CM | POA: Diagnosis not present

## 2021-10-29 DIAGNOSIS — L8990 Pressure ulcer of unspecified site, unspecified stage: Secondary | ICD-10-CM | POA: Insufficient documentation

## 2021-10-29 LAB — URINALYSIS, ROUTINE W REFLEX MICROSCOPIC
Bilirubin Urine: NEGATIVE
Glucose, UA: NEGATIVE mg/dL
Ketones, ur: 80 mg/dL — AB
Leukocytes,Ua: NEGATIVE
Nitrite: NEGATIVE
Protein, ur: 30 mg/dL — AB
RBC / HPF: >50 RBC/hpf — ABNORMAL HIGH (ref 0–5)
Specific Gravity, Urine: 1.015 (ref 1.005–1.030)
pH: 6 (ref 5.0–8.0)

## 2021-10-29 LAB — BLOOD GAS, VENOUS
Acid-base deficit: 1.5 mmol/L (ref 0.0–2.0)
Bicarbonate: 23.7 mmol/L (ref 20.0–28.0)
O2 Saturation: 86.9 %
Patient temperature: 37
pCO2, Ven: 41 mmHg — ABNORMAL LOW (ref 44–60)
pH, Ven: 7.37 (ref 7.25–7.43)
pO2, Ven: 57 mmHg — ABNORMAL HIGH (ref 32–45)

## 2021-10-29 LAB — POCT I-STAT 7, (LYTES, BLD GAS, ICA,H+H)
Acid-base deficit: 2 mmol/L (ref 0.0–2.0)
Bicarbonate: 23 mmol/L (ref 20.0–28.0)
Calcium, Ion: 1.07 mmol/L — ABNORMAL LOW (ref 1.15–1.40)
HCT: 33 % — ABNORMAL LOW (ref 39.0–52.0)
Hemoglobin: 11.2 g/dL — ABNORMAL LOW (ref 13.0–17.0)
O2 Saturation: 100 %
Patient temperature: 98.7
Potassium: 3.6 mmol/L (ref 3.5–5.1)
Sodium: 143 mmol/L (ref 135–145)
TCO2: 24 mmol/L (ref 22–32)
pCO2 arterial: 37.4 mmHg (ref 32–48)
pH, Arterial: 7.397 (ref 7.35–7.45)
pO2, Arterial: 363 mmHg — ABNORMAL HIGH (ref 83–108)

## 2021-10-29 LAB — PHOSPHORUS
Phosphorus: 2.7 mg/dL (ref 2.5–4.6)
Phosphorus: 2.7 mg/dL (ref 2.5–4.6)

## 2021-10-29 LAB — GLUCOSE, CAPILLARY
Glucose-Capillary: 110 mg/dL — ABNORMAL HIGH (ref 70–99)
Glucose-Capillary: 89 mg/dL (ref 70–99)
Glucose-Capillary: 97 mg/dL (ref 70–99)
Glucose-Capillary: 96 mg/dL (ref 70–99)

## 2021-10-29 LAB — COMPREHENSIVE METABOLIC PANEL
ALT: 22 U/L (ref 0–44)
AST: 29 U/L (ref 15–41)
Albumin: 3.5 g/dL (ref 3.5–5.0)
Alkaline Phosphatase: 74 U/L (ref 38–126)
Anion gap: 15 (ref 5–15)
BUN: 13 mg/dL (ref 8–23)
CO2: 22 mmol/L (ref 22–32)
Calcium: 7.9 mg/dL — ABNORMAL LOW (ref 8.9–10.3)
Chloride: 105 mmol/L (ref 98–111)
Creatinine, Ser: 1.42 mg/dL — ABNORMAL HIGH (ref 0.61–1.24)
GFR, Estimated: 56 mL/min — ABNORMAL LOW (ref >60)
Glucose, Bld: 107 mg/dL — ABNORMAL HIGH (ref 70–99)
Potassium: 3.7 mmol/L (ref 3.5–5.1)
Sodium: 142 mmol/L (ref 135–145)
Total Bilirubin: 1 mg/dL (ref 0.3–1.2)
Total Protein: 6.2 g/dL — ABNORMAL LOW (ref 6.5–8.1)

## 2021-10-29 LAB — CBC
HCT: 36.6 % — ABNORMAL LOW (ref 39.0–52.0)
Hemoglobin: 12.7 g/dL — ABNORMAL LOW (ref 13.0–17.0)
MCH: 33.3 pg (ref 26.0–34.0)
MCHC: 34.7 g/dL (ref 30.0–36.0)
MCV: 96.1 fL (ref 80.0–100.0)
Platelets: 215 10*3/uL (ref 150–400)
RBC: 3.81 MIL/uL — ABNORMAL LOW (ref 4.22–5.81)
RDW: 12.4 % (ref 11.5–15.5)
WBC: 12.5 10*3/uL — ABNORMAL HIGH (ref 4.0–10.5)
nRBC: 0 % (ref 0.0–0.2)

## 2021-10-29 LAB — PROTIME-INR
INR: 2.1 — ABNORMAL HIGH (ref 0.8–1.2)
Prothrombin Time: 23.1 seconds — ABNORMAL HIGH (ref 11.4–15.2)

## 2021-10-29 LAB — CK: Total CK: 1587 U/L — ABNORMAL HIGH (ref 49–397)

## 2021-10-29 LAB — MRSA NEXT GEN BY PCR, NASAL: MRSA by PCR Next Gen: NOT DETECTED

## 2021-10-29 LAB — MAGNESIUM
Magnesium: 1.5 mg/dL — ABNORMAL LOW (ref 1.7–2.4)
Magnesium: 1.6 mg/dL — ABNORMAL LOW (ref 1.7–2.4)

## 2021-10-29 MED ORDER — ETOMIDATE 2 MG/ML IV SOLN
20.0000 mg | Freq: Once | INTRAVENOUS | Status: AC
  Administered 2021-10-29: 20 mg via INTRAVENOUS

## 2021-10-29 MED ORDER — MAGNESIUM SULFATE 4 GM/100ML IV SOLN
4.0000 g | Freq: Once | INTRAVENOUS | Status: AC
  Administered 2021-10-29: 4 g via INTRAVENOUS
  Filled 2021-10-29: qty 100

## 2021-10-29 MED ORDER — SODIUM CHLORIDE 0.9 % IV SOLN
2000.0000 mg | Freq: Once | INTRAVENOUS | Status: AC
  Administered 2021-10-29: 2000 mg via INTRAVENOUS
  Filled 2021-10-29: qty 20

## 2021-10-29 MED ORDER — ROCURONIUM BROMIDE 10 MG/ML (PF) SYRINGE
PREFILLED_SYRINGE | INTRAVENOUS | Status: AC
  Filled 2021-10-29: qty 10

## 2021-10-29 MED ORDER — PYRIDOSTIGMINE BROMIDE 10 MG/2ML IV SOLN: 2.0000 mg | Freq: Three times a day (TID) | INTRAVENOUS | Status: DC

## 2021-10-29 MED ORDER — FENTANYL CITRATE (PF) 100 MCG/2ML IJ SOLN
INTRAMUSCULAR | Status: AC
  Filled 2021-10-29: qty 2

## 2021-10-29 MED ORDER — PHENOBARBITAL SODIUM 130 MG/ML IJ SOLN
97.5000 mg | Freq: Three times a day (TID) | INTRAMUSCULAR | Status: AC
  Administered 2021-10-29 – 2021-10-31 (×6): 97.5 mg via INTRAVENOUS
  Filled 2021-10-29 (×6): qty 1

## 2021-10-29 MED ORDER — ETOMIDATE 2 MG/ML IV SOLN
INTRAVENOUS | Status: AC
  Filled 2021-10-29: qty 20

## 2021-10-29 MED ORDER — PHENOBARBITAL SODIUM 65 MG/ML IJ SOLN
32.5000 mg | Freq: Three times a day (TID) | INTRAMUSCULAR | Status: DC
  Administered 2021-11-02 – 2021-11-03 (×3): 32.5 mg via INTRAVENOUS
  Filled 2021-10-29 (×4): qty 1

## 2021-10-29 MED ORDER — DOCUSATE SODIUM 50 MG/5ML PO LIQD
100.0000 mg | Freq: Two times a day (BID) | ORAL | Status: DC
  Administered 2021-10-29 – 2021-11-04 (×9): 100 mg
  Filled 2021-10-29 (×10): qty 10

## 2021-10-29 MED ORDER — ROCURONIUM BROMIDE 50 MG/5ML IV SOLN
100.0000 mg | Freq: Once | INTRAVENOUS | Status: AC
  Administered 2021-10-29: 100 mg via INTRAVENOUS

## 2021-10-29 MED ORDER — SODIUM CHLORIDE 0.9 % IV SOLN
INTRAVENOUS | Status: DC | PRN
Start: 2021-10-29 — End: 2021-11-05

## 2021-10-29 MED ORDER — APIXABAN 5 MG PO TABS
5.0000 mg | ORAL_TABLET | Freq: Two times a day (BID) | ORAL | Status: DC
  Administered 2021-10-29 – 2021-10-31 (×5): 5 mg via ORAL
  Filled 2021-10-29 (×5): qty 1

## 2021-10-29 MED ORDER — DEXMEDETOMIDINE HCL IN NACL 400 MCG/100ML IV SOLN
0.4000 ug/kg/h | INTRAVENOUS | Status: AC
  Administered 2021-10-29 (×2): 1.2 ug/kg/h via INTRAVENOUS
  Administered 2021-10-29: 0.5 ug/kg/h via INTRAVENOUS
  Administered 2021-10-29: 1.2 ug/kg/h via INTRAVENOUS
  Administered 2021-10-30: 1.4 ug/kg/h via INTRAVENOUS
  Administered 2021-10-30 (×2): 1.2 ug/kg/h via INTRAVENOUS
  Administered 2021-10-30: 1 ug/kg/h via INTRAVENOUS
  Administered 2021-10-30 – 2021-10-31 (×8): 1.2 ug/kg/h via INTRAVENOUS
  Administered 2021-10-31: 0.8 ug/kg/h via INTRAVENOUS
  Administered 2021-10-31 – 2021-11-01 (×5): 1.2 ug/kg/h via INTRAVENOUS
  Filled 2021-10-29 (×8): qty 100
  Filled 2021-10-29: qty 200
  Filled 2021-10-29 (×11): qty 100
  Filled 2021-10-29: qty 200

## 2021-10-29 MED ORDER — CHLORHEXIDINE GLUCONATE 0.12% ORAL RINSE (MEDLINE KIT)
15.0000 mL | Freq: Two times a day (BID) | OROMUCOSAL | Status: DC
  Administered 2021-10-29 – 2021-11-02 (×8): 15 mL via OROMUCOSAL

## 2021-10-29 MED ORDER — ORAL CARE MOUTH RINSE
15.0000 mL | Freq: Two times a day (BID) | OROMUCOSAL | Status: DC
  Administered 2021-10-29 (×2): 15 mL via OROMUCOSAL

## 2021-10-29 MED ORDER — PROSOURCE TF PO LIQD
45.0000 mL | Freq: Four times a day (QID) | ORAL | Status: DC
  Administered 2021-10-29 – 2021-11-02 (×15): 45 mL
  Filled 2021-10-29 (×14): qty 45

## 2021-10-29 MED ORDER — POTASSIUM CHLORIDE 10 MEQ/100ML IV SOLN
10.0000 meq | INTRAVENOUS | Status: AC
  Administered 2021-10-29 (×4): 10 meq via INTRAVENOUS
  Filled 2021-10-29 (×4): qty 100

## 2021-10-29 MED ORDER — FOLIC ACID 1 MG PO TABS
1.0000 mg | ORAL_TABLET | Freq: Every day | ORAL | Status: DC
Start: 2021-10-30 — End: 2021-11-05
  Administered 2021-10-30 – 2021-11-05 (×6): 1 mg
  Filled 2021-10-29 (×6): qty 1

## 2021-10-29 MED ORDER — PANTOPRAZOLE SODIUM 40 MG IV SOLR
40.0000 mg | Freq: Every day | INTRAVENOUS | Status: DC
  Administered 2021-10-29 – 2021-10-30 (×2): 40 mg via INTRAVENOUS
  Filled 2021-10-29 (×2): qty 10

## 2021-10-29 MED ORDER — PHENOBARBITAL SODIUM 65 MG/ML IJ SOLN
65.0000 mg | Freq: Three times a day (TID) | INTRAMUSCULAR | Status: AC
  Administered 2021-10-31 – 2021-11-02 (×6): 65 mg via INTRAVENOUS
  Filled 2021-10-29 (×6): qty 1

## 2021-10-29 MED ORDER — ADULT MULTIVITAMIN W/MINERALS CH
1.0000 | ORAL_TABLET | Freq: Every day | ORAL | Status: DC
  Administered 2021-10-30 – 2021-11-05 (×6): 1
  Filled 2021-10-29 (×6): qty 1

## 2021-10-29 MED ORDER — FENTANYL CITRATE (PF) 100 MCG/2ML IJ SOLN: 25.0000 ug | INTRAMUSCULAR | Status: DC | PRN

## 2021-10-29 MED ORDER — LORAZEPAM 2 MG/ML IJ SOLN
4.0000 mg | Freq: Once | INTRAMUSCULAR | Status: AC
  Administered 2021-10-29: 4 mg via INTRAVENOUS
  Filled 2021-10-29: qty 2

## 2021-10-29 MED ORDER — LORAZEPAM 2 MG/ML IJ SOLN
1.0000 mg | INTRAMUSCULAR | Status: DC | PRN
  Administered 2021-10-29 – 2021-10-30 (×2): 1 mg via INTRAVENOUS
  Filled 2021-10-29 (×2): qty 1

## 2021-10-29 MED ORDER — ORAL CARE MOUTH RINSE
15.0000 mL | OROMUCOSAL | Status: DC
  Administered 2021-10-29 – 2021-11-02 (×39): 15 mL via OROMUCOSAL

## 2021-10-29 MED ORDER — VITAL 1.5 CAL PO LIQD
1000.0000 mL | ORAL | Status: DC
  Administered 2021-10-29 – 2021-11-01 (×4): 1000 mL
  Filled 2021-10-29: qty 1000

## 2021-10-29 MED ORDER — THIAMINE HCL 100 MG PO TABS
100.0000 mg | ORAL_TABLET | Freq: Every day | ORAL | Status: DC
  Administered 2021-10-31 – 2021-11-05 (×5): 100 mg
  Filled 2021-10-29 (×5): qty 1

## 2021-10-29 MED ORDER — POLYETHYLENE GLYCOL 3350 17 G PO PACK
17.0000 g | PACK | Freq: Every day | ORAL | Status: DC
  Administered 2021-10-30 – 2021-10-31 (×2): 17 g
  Filled 2021-10-29 (×2): qty 1

## 2021-10-29 MED ORDER — LACTATED RINGERS IV SOLN: INTRAVENOUS | Status: DC

## 2021-10-29 MED ORDER — MIDAZOLAM HCL 2 MG/2ML IJ SOLN
INTRAMUSCULAR | Status: AC
  Filled 2021-10-29: qty 2

## 2021-10-29 NOTE — Procedures (Signed)
Intubation Procedure Note ? ?Nicholas Estes  ?[id number]  ?1960/09/26 ? ?Date:10/29/21  ?Time:9:29 AM  ? ?Provider Performing:Ardys Hataway  ? ? ?Procedure: Intubation (31500) ? ?Indication(s) ?Respiratory Failure ? ?Consent ?Unable to obtain consent due to emergent nature of procedure. ? ? ?Anesthesia ?Etomidate and Rocuronium ? ? ?Time Out ?Verified patient identification, verified procedure, site/side was marked, verified correct patient position, special equipment/implants available, medications/allergies/relevant history reviewed, required imaging and test results available. ? ? ?Sterile Technique ?Usual hand hygeine, masks, and gloves were used ? ? ?Procedure Description ?Patient positioned in bed supine.  Sedation given as noted above.  Patient was intubated with endotracheal tube using  DL .  View was Grade 1 full glottis .  Number of attempts was 1.  Colorimetric CO2 detector was consistent with tracheal placement. ? ? ?Complications/Tolerance ?None; patient tolerated the procedure well. ?Chest X-ray is ordered to verify placement. ? ? ?EBL ?Minimal ? ? ?Specimen(s) ?None ? ?

## 2021-10-29 NOTE — Progress Notes (Signed)
Initial Nutrition Assessment ? ?DOCUMENTATION CODES:  ? ?Not applicable ? ?INTERVENTION:  ? ?Initiate tube feeds via OG tube: ?- Start Vital 1.5 @ 20 ml/hr and advance by 10 ml q 8 hours to goal rate of 55 ml/hr (1320 ml/day) ?- ProSource TF 45 ml QID ? ?Tube feeding regimen at goal provides 2140 kcal, 133 grams of protein, and 1008 ml of H2O. ? ?Monitor magnesium, potassium, and phosphorus BID for at least 3 days, MD to replete as needed, as pt is at risk for refeeding syndrome given EtOH abuse. ? ?- Continue MVI with minerals daily per tube ? ?NUTRITION DIAGNOSIS:  ? ?Inadequate oral intake related to inability to eat as evidenced by NPO status. ? ?GOAL:  ? ?Patient will meet greater than or equal to 90% of their needs ? ?MONITOR:  ? ?Vent status, Labs, Weight trends, TF tolerance, Skin ? ?REASON FOR ASSESSMENT:  ? ?Ventilator, Consult ?Enteral/tube feeding initiation and management ? ?ASSESSMENT:  ? ?61 year old male who presented to the ED on 4/11 with AMS after medication overdose. 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 Pt admitted with acute encephalopathy. ? ?04/12 - intubated ? ?Discussed pt with RN and during ICU rounds. Pt with acute respiratory failure, aspiration PNA, and probable seizures. ? ?Consult received for enteral nutrition initiation and management. Pt with OG tube in stomach per abdominal x-ray. X-ray also shows mild gaseous distention of stomach and upper abdominal bowel loops. ? ?Unable to obtain diet and weight history at this time. No visitors in room at time of RD visit.Weight history in chart is very limited. Current weight is down only 3 kg compared to weight from 2 years ago. ? ?Patient is currently intubated on ventilator support ?MV: 10.4 L/min ?Temp (24hrs), Avg:99.1 ?F (37.3 ?C), Min:98.2 ?F (36.8 ?C), Max:100.7 ?F (38.2 ?C) ? ?Drips: ?Precedex ?LR: 150 ml/hr ? ?Medications reviewed and  include: colace, folic acid, MVI with minerals, IV protonix, phenobarbital taper, miralax, thiamine, IV abx, IV thiamine 500 mg q 8 hours ? ?Labs reviewed: creatinine 1.42, ionized calcium 1.07, magnesium 1.6 on 4/11, WBC 12.5 ?CBG's: 89-110 x 24 hours ? ?UOP: 1525 ml x 24 hours ?I/O's: +1.6 L since admit ? ?NUTRITION - FOCUSED PHYSICAL EXAM: ? ?Flowsheet Row Most Recent Value  ?Orbital Region No depletion  ?Upper Arm Region No depletion  ?Thoracic and Lumbar Region No depletion  ?Buccal Region Unable to assess  ?Temple Region No depletion  ?Clavicle Bone Region Mild depletion  ?Clavicle and Acromion Bone Region Mild depletion  ?Scapular Bone Region No depletion  ?Dorsal Hand No depletion  ?Patellar Region No depletion  ?Anterior Thigh Region Mild depletion  ?Posterior Calf Region No depletion  ?Edema (RD Assessment) None  ?Hair Reviewed  ?Eyes Reviewed  ?Mouth Reviewed  ?Skin Reviewed  ?Nails Reviewed  ? ?  ? ? ?Diet Order:   ?Diet Order   ? ?       ?  Diet NPO time specified  Diet effective now       ?  ? ?  ?  ? ?  ? ? ?EDUCATION NEEDS:  ? ?No education needs have been identified at this time ? ?Skin:  Skin Assessment: ?Skin Integrity Issues: ?Stage I: coccyx ? ?Last BM:  no documented BM ? ?Height:  ? ?Ht Readings from Last 1 Encounters:  ?10/29/21 5\' 11"  (1.803 m)  ? ? ?Weight:  ? ?Wt Readings from Last 1 Encounters:  ?10/28/21  104.3 kg  ? ? ?Ideal Body Weight:  78.2 kg ? ?BMI:  Body mass index is 32.07 kg/m?. ? ?Estimated Nutritional Needs:  ? ?Kcal:  2000-2200 ? ?Protein:  130-150 grams ? ?Fluid:  >/= 2.0 L ? ? ? ?Gustavus Bryant, MS, RD, LDN ?Inpatient Clinical Dietitian ?Please see AMi[facility] for contact information. ? ?

## 2021-10-29 NOTE — Progress Notes (Signed)
Pharmacy Phenobarbital Consult Note  ? ?Pharmacy Consult for IV Phenobarbital   ?Indication: Alcohol Withdrawal   ? ?Labs:  ?Lab Results  ?Component Value Date  ? CREATININE 1.42 (H) 10/29/2021  ? AST 29 10/29/2021  ? ALT 22 10/29/2021  ? ALKPHOS 74 10/29/2021  ? ? ?Nursing Bedside Screening:   ?AUDIT-C:AMS - unable to assess ?PAWSS: AMS - unable to assess ?Last known drink: unknown, Ethanol negative on admission ?Bedside assessment completed by Eston Esters, RN on 10/29/2021  ? ?Assessment: ?Nicholas Estes is a 61 y.o. year old male admitted on 10/28/2021. Patients meets criteria for high risk dosing of phenobarbital. Pharmacy consulted to dose phenobarbital.   ? ?Plan: ?Start high risk taper IV: Phenobarbital 97.5mg  IV push q 8h x 6 doses followed by Phenobarbital 65mg  IV push q 8h x 6 doses followed by Phenobarbital 32.4mg  IV push q 8h x 6 doses ?Start Lorazepam 1mg  IV q4h prn agitation    ?Concern for polypharmacy/overdose with oxcarbazepine and wellbutrin, will closely monitor ? ?Thank you for involving pharmacy in this patient's care. ? ?Elita Quick, PharmD ?PGY1 Ambulatory Care Pharmacy Resident ?10/29/2021 8:43 AM ? ?**Pharmacist phone directory can be found on [email or web address] listed under [place]** ? ?  ?

## 2021-10-29 NOTE — Progress Notes (Signed)
LTM EEG hooked up and running - no initial skin breakdown - push button tested - neuro notified. [facility] monitoring.  

## 2021-10-29 NOTE — Procedures (Signed)
Bedside Bronchoscopy Procedure Note ?Everitt Amber ?[id number] ?12-Dec-1960 ? ?Procedure: Bronchoscopy ?Indications: Obtain specimens for culture and/or other diagnostic studies and Remove secretions ? ?Procedure Details: ?ET Tube Size: 8.0 ?ET Tube secured at lip (cm): 24 ?Bite block in place: No ?In preparation for procedure, Patient hyper-oxygenated with 100 % FiO2 ?Airway entered and the following bronchi were examined: RUL, RML, RLL, LUL, LLL, and Bronchi.   ?Bronchoscope removed. Patient remained on 100% FiO2 throughout procedure.   ? ?Evaluation ?BP (!) 101/55 (BP Location: Right Leg)   Pulse (!) 112   Temp 98.7 ?F (37.1 ?C) (Axillary)   Resp (!) 28   Ht 5\' 11"  (1.803 m)   Wt 104.3 kg   SpO2 90%   BMI 32.07 kg/m?  ?Breath Sounds:Rhonch ?O2 sats: stable throughout ?Patient's Current Condition: stable ?Specimens:  Sent purulent fluid ?Complications: No apparent complications ?Patient did tolerate procedure well. ? ? ?Kathie Dike ?10/29/2021, 9:34 AM ? ? ?

## 2021-10-29 NOTE — Procedures (Signed)
Bronchoscopy Procedure Note ? ?Everitt Amber  ?[id number]  ?1960-12-31 ? ?Date:10/29/21  ?Time:9:30 AM  ? ?Provider Performing:Lucia Harm  ? ?Procedure(s):  Flexible bronchoscopy with bronchial alveolar lavage (40981) and Initial Therapeutic Aspiration of Tracheobronchial Tree (19147) ? ?Indication(s) ?Acute respiratory failure ? ?Consent ?Unable to obtain consent due to emergent nature of procedure. ? ?Anesthesia ?Etomidate and rocuronium ? ? ?Time Out ?Verified patient identification, verified procedure, site/side was marked, verified correct patient position, special equipment/implants available, medications/allergies/relevant history reviewed, required imaging and test results available. ? ? ?Sterile Technique ?Usual hand hygiene, masks, gowns, and gloves were used ? ? ?Procedure Description ?Bronchoscope advanced through endotracheal tube and into airway.  Airways were examined down to subsegmental level with findings noted below.   ?Following diagnostic evaluation, BAL(s) performed in RLL with normal saline and return of Think greenish fluid and Therapeutic aspiration performed in All over respiratory tree ? ?Findings: Thick greenish secretions noted all over in respiratory tree ? ? ?Complications/Tolerance ?None; patient tolerated the procedure well. ?Chest X-ray is needed post procedure. ? ? ?EBL ?Minimal ? ? ?Specimen(s) ?Sputum ? ?

## 2021-10-29 NOTE — Procedures (Signed)
Patient Name: Nicholas Estes  ?MRN: [id number]  ?Epilepsy Attending: Lora Havens  ?Referring Physician/Provider: Donnetta Simpers, MD ?Date: 10/29/2021 ?Duration: 23.16 mins ? ?Patient history: 61yo M with AMS. EEG to evaluate for seizure. ? ?Level of alertness: lethargic  ? ?AEDs during EEG study: Phenobarb ? ?Technical aspects: This EEG study was done with scalp electrodes positioned according to the 10-20 International system of electrode placement. Electrical activity was acquired at a sampling rate of 500Hz  and reviewed with a high frequency filter of 70Hz  and a low frequency filter of 1Hz . EEG data were recorded continuously and digitally stored.  ? ?Description: EEG showed intermittent generalized 8hz  alpha activity lasting 1-2 seconds alternating with low amplitude 2- 3Hz  delta slowing. Hyperventilation and photic stimulation were not performed.    ? ?ABNORMALITY ?- Continuous slow, generalized ? ?IMPRESSION: ?This study is suggestive of moderate to severe diffuse encephalopathy, nonspecific etiology. No seizures or epileptiform discharges were seen throughout the recording. ? ?Lora Havens  ? ?

## 2021-10-29 NOTE — Progress Notes (Signed)
LTM maint complete - no skin breakdown  ?Serviced C3 P3 Pz P4 A2 O2 ?[facility] monitored, Event button test confirmed by [facility]. ? ?

## 2021-10-29 NOTE — Progress Notes (Signed)
? ?NAME:  Nicholas Estes, MRN:  [id number], DOB:  1960-10-03, LOS: 1 ?ADMISSION DATE:  10/28/2021, CONSULTATION DATE:  4/11 ?REFERRING MD:  FMTS , REASON FOR CONSULT: Agitation and hypotension  ? ?History of Present Illness:  ?Patient is encephalopathic and/or intubated. Therefore history has been obtained from chart review.  ? ?Nicholas Estes, is a 61 y.o. male, who presented to the [facility] ED with a chief complaint of Altered mental status ? ?They have a pertinent past medical history of ETOH abuse, afib, HTN, stroke, tobacco use, and conversion disorder, non epileptiform spells. ? ?Prior to arrival, he was reportedly visited by a friend and found altered with vomit near the patient. EMS reports of empty med bottles of oxcarbazepine and atorvastatin.  ? ?The patient was admitted to the [facility] service. No acute changes on head CT. Poison control contacted in the ED. The patient developed agitation while in the ED. The were given 2.5 of haldol and 2 mg of ativan. They then developed on isolated episode of hypotension (60/41). 1 L of IVF was given. ? ?PCCM was consulted for evaluation of hypotension and agitation.  Overnight patient became agitated, was transferred to ICU for close monitoring and Precedex infusion ? ?Pertinent  Medical History  ?Etoh use disorder ?Mood disorder ?Substance abuse ?HFpEF ?Tobacco use  ? ?Significant Hospital Events: ?Including procedures, antibiotic start and stop dates in addition to other pertinent events   ?4/11 ED via EMS for AMS. Admit to FPTS with concern for Dts. PCCM consult for agitation + medication related hypotension  ? ?Interim History / Subjective:  ?This morning patient was noted to be agitated, yelling and then becomes completely unresponsive then he started with stereotyped movement of left side of the body which happen intermittently followed by periods of unresponsiveness, he had no gag with lots of secretion pooling in the back of throat, decision was to proceed with  endotracheal intubation ? ?Objective   ?Blood pressure (!) 101/55, pulse (!) 112, temperature 98.7 ?F (37.1 ?C), temperature source Axillary, resp. rate (!) 28, height 5\' 11"  (1.803 m), weight 104.3 kg, SpO2 96 %. ?   ?Vent Mode: PRVC ?FiO2 (%):  [50 %-100 %] 100 % ?Set Rate:  [18 bmp] 18 bmp ?Vt Set:  [600 mL] 600 mL ?PEEP:  [5 cmH20] 5 cmH20 ?Plateau Pressure:  [15 cmH20] 15 cmH20  ? ?Intake/Output Summary (Last 24 hours) at 10/29/2021 1038 ?Last data filed at 10/29/2021 0800 ?Gross per 24 hour  ?Intake 3176 ml  ?Output 925 ml  ?Net 2251 ml  ? ?Filed Weights  ? 10/28/21 1028 10/28/21 2300  ?Weight: 107.3 kg 104.3 kg  ? ? ?Examination: ?  ?Physical exam: ?General: Crtitically ill-appearing obese male, orally intubated ?HEENT: [place]/AT, eyes anicteric.  ETT and OGT in place ?Neuro: Sedated, not following commands.  Eyes are closed.  Pupils 3 mm bilateral reactive to light ?Chest: Coarse breath sounds, no wheezes or rhonchi ?Heart: Regular rhythm, tachycardic, no murmurs or gallops ?Abdomen: Soft, nontender, nondistended, bowel sounds present ?Skin: No rash ? ? ?Resolved Hospital Problem list   ? ? ?Assessment & Plan:  ?Acute hypoxic/hypercapnic respiratory failure ?Aspiration pneumonia ?Patient became hypoxic with O2 sat dropped down to mid 80s on 5 L oxygen via nasal cannula ?He had to take copious amount of greenish secretion pooling in the back of the throat, he was not able to protect his airway ?Decision was to proceed with endotracheal intubation, postintubation patient underwent bronchoscopy, which noted copious amount of thick creamy secretions all  over the respiratory please see separate note for procedures ?Continue protective ventilation ?Follow-up respiratory culture ?Started on IV Unasyn ?Continue aggressive IV fluid ? ?Acute metabolic encephalopathy with agitated delirium  ?EtOH abuse, THC abuse, Mood disorder, conversion disorder ?Probable seizures could be related to alcohol withdrawal ?Patient remained  encephalopathic ?High likelihood that he is withdrawing from alcohol ?He started with a stereotyped movement on left side of body, followed by episodes of unresponsiveness likely related to focal complex seizures ?We will get continuous EEG ?Loaded with Keppra ?Started on phenobarbital protocol for alcohol withdrawal ?Continue thiamine ?Continue IV fluid ? ?Medication related hypotension, resolved ? ?AKI due to severe dehydration ?Serum creatinine continue to rise, patient looks severely dry on physical exam ?We will give 1 L of IV fluid bolus followed by continuous LR infusion ?Monitor intake and output ?Avoid nephrotoxic agents ? ?Hypokalemia/ Hypomagnesemia/hypocalcemia ?Continue aggressive electrolyte supplement and monitor ? ?Best Practice (right click and "Reselect all SmartList Selections" daily)  ? ?Diet/type: NPO, start tube feeds ?DVT prophylaxis: LMWH ?GI prophylaxis: N/A ?Lines: N/A ?Foley:  N/A ?Code Status:  full code ?Last date of multidisciplinary goals of care discussion [pending, unable to reach to patient's family] ? ?Labs   ?CBC: ?Recent Labs  ?Lab 10/28/21 ?1016 10/28/21 ?1025 10/28/21 ?2357 10/29/21 ?0230  ?WBC 14.8*  --   --  12.5*  ?NEUTROABS 12.1*  --   --   --   ?HGB 15.2 15.3 12.2* 12.7*  ?HCT 43.9 45.0 36.0* 36.6*  ?MCV 96.9  --   --  96.1  ?PLT 235  --   --  215  ? ? ?Basic Metabolic Panel: ?Recent Labs  ?Lab 10/28/21 ?1016 10/28/21 ?1025 10/28/21 ?1724 10/28/21 ?2357 10/29/21 ?0230  ?NA 136 135 139 143 142  ?K 4.1 5.2* 3.2* 3.4* 3.7  ?CL 97*  --  100  --  105  ?CO2 25  --  27  --  22  ?GLUCOSE 159*  --  119*  --  107*  ?BUN 12  --  13  --  13  ?CREATININE 1.09  --  1.33*  --  1.42*  ?CALCIUM 9.0  --  8.7*  --  7.9*  ?MG  --   --  1.6*  --   --   ? ?GFR: ?Estimated Creatinine Clearance: 67.1 mL/min (A) (by C-G formula based on SCr of 1.42 mg/dL (H)). ?Recent Labs  ?Lab 10/28/21 ?1016 10/29/21 ?0230  ?WBC 14.8* 12.5*  ?LATICACIDVEN 1.7  --   ? ? ?Liver Function Tests: ?Recent Labs  ?Lab  10/28/21 ?1016 10/28/21 ?1724 10/29/21 ?0230  ?AST 24 22 29   ?ALT 23 21 22   ?ALKPHOS 96 84 74  ?BILITOT 1.5* 1.0 1.0  ?PROT 7.3 6.8 6.2*  ?ALBUMIN 4.1 3.7 3.5  ? ?No results for input(s): LIPASE, AMYLASE in the last 168 hours. ?Recent Labs  ?Lab 10/28/21 ?1016  ?AMMONIA 28  ? ? ?ABG ?   ?Component Value Date/Time  ? PHART 7.312 (L) 10/28/2021 2357  ? PCO2ART 48.7 (H) 10/28/2021 2357  ? PO2ART 90 10/28/2021 2357  ? HCO3 23.7 10/29/2021 0230  ? TCO2 26 10/28/2021 2357  ? ACIDBASEDEF 1.5 10/29/2021 0230  ? O2SAT 86.9 10/29/2021 0230  ?  ? ?Coagulation Profile: ?Recent Labs  ?Lab 10/29/21 ?0230  ?INR 2.1*  ? ? ?Cardiac Enzymes: ?Recent Labs  ?Lab 10/28/21 ?1724  ?CKTOTAL 235  ? ? ?HbA1C: ?Hgb A1c MFr Bld  ?Date/Time Value Ref Range Status  ?11/05/2019 05:52 AM 6.0 (H) 4.8 -  5.6 % Final  ?  Comment:  ?  (NOTE) ?Pre diabetes:          5.7%-6.4% ?Diabetes:              >6.4% ?Glycemic control for   <7.0% ?adults with diabetes ?  ? ? ?CBG: ?Recent Labs  ?Lab 10/28/21 ?1021 10/28/21 ?2308  ?GLUCAP 179* 110*  ? ?Total critical care time: 47 minutes ? ?Performed by: Jacky Kindle ?  ?Critical care time was exclusive of separately billable procedures and treating other patients. ?  ?Critical care was necessary to treat or prevent imminent or life-threatening deterioration. ?  ?Critical care was time spent personally by me on the following activities: development of treatment plan with patient and/or surrogate as well as nursing, discussions with consultants, evaluation of patient's response to treatment, examination of patient, obtaining history from patient or surrogate, ordering and performing treatments and interventions, ordering and review of laboratory studies, ordering and review of radiographic studies, pulse oximetry and re-evaluation of patient's condition. ?  ?Jacky Kindle MD ?[facility] ?See Amion for pager ?If no response to pager, please call [phone] until 7pm ?After 7pm, Please call E-link  [phone] ? ? ? ? ?

## 2021-10-29 NOTE — Progress Notes (Signed)
[facility] ADULT ICU REPLACEMENT PROTOCOL ? ? ?The patient does apply for the [facility] Adult ICU Electrolyte Replacment Protocol based on the criteria listed below:  ? ?1.Exclusion criteria: TCTS patients, ECMO patients, and Dialysis patients ?2. Is GFR >/= 30 ml/min? Yes.    ?Patient's GFR today is 56 ?3. Is SCr </= 2? Yes.   ?Patient's SCr is 1.42 mg/dL ?4. Did SCr increase >/= 0.5 in 24 hours? No. ?5.Pt's weight >40kg  Yes.   ?6. Abnormal electrolyte(s): K+ 3.7  ?7. Electrolytes replaced per protocol ?8.  Call MD STAT for K+ </= 2.5, Phos </= 1, or Mag </= 1 ?Physician:  n/a ? ?Nicholas Estes 10/29/2021 3:21 AM ? ?

## 2021-10-29 NOTE — Progress Notes (Signed)
EEG complete - results pending 

## 2021-10-30 ENCOUNTER — Inpatient Hospital Stay (HOSPITAL_COMMUNITY)

## 2021-10-30 DIAGNOSIS — N179 Acute kidney failure, unspecified: Secondary | ICD-10-CM

## 2021-10-30 DIAGNOSIS — F10931 Alcohol use, unspecified with withdrawal delirium: Secondary | ICD-10-CM | POA: Diagnosis not present

## 2021-10-30 DIAGNOSIS — G9340 Encephalopathy, unspecified: Secondary | ICD-10-CM | POA: Diagnosis not present

## 2021-10-30 LAB — GLUCOSE, CAPILLARY
Glucose-Capillary: 115 mg/dL — ABNORMAL HIGH (ref 70–99)
Glucose-Capillary: 115 mg/dL — ABNORMAL HIGH (ref 70–99)
Glucose-Capillary: 123 mg/dL — ABNORMAL HIGH (ref 70–99)
Glucose-Capillary: 128 mg/dL — ABNORMAL HIGH (ref 70–99)
Glucose-Capillary: 135 mg/dL — ABNORMAL HIGH (ref 70–99)
Glucose-Capillary: 136 mg/dL — ABNORMAL HIGH (ref 70–99)
Glucose-Capillary: 90 mg/dL (ref 70–99)

## 2021-10-30 LAB — MAGNESIUM: Magnesium: 1.8 mg/dL (ref 1.7–2.4)

## 2021-10-30 LAB — BASIC METABOLIC PANEL
Anion gap: 10 (ref 5–15)
Anion gap: 7 (ref 5–15)
BUN: 8 mg/dL (ref 8–23)
BUN: 5 mg/dL — ABNORMAL LOW (ref 8–23)
CO2: 23 mmol/L (ref 22–32)
CO2: 24 mmol/L (ref 22–32)
Calcium: 7.6 mg/dL — ABNORMAL LOW (ref 8.9–10.3)
Calcium: 7.6 mg/dL — ABNORMAL LOW (ref 8.9–10.3)
Chloride: 108 mmol/L (ref 98–111)
Chloride: 108 mmol/L (ref 98–111)
Creatinine, Ser: 0.97 mg/dL (ref 0.61–1.24)
Creatinine, Ser: 0.85 mg/dL (ref 0.61–1.24)
GFR, Estimated: >60 mL/min (ref >60)
GFR, Estimated: >60 mL/min (ref >60)
Glucose, Bld: 120 mg/dL — ABNORMAL HIGH (ref 70–99)
Glucose, Bld: 124 mg/dL — ABNORMAL HIGH (ref 70–99)
Potassium: 2.8 mmol/L — ABNORMAL LOW (ref 3.5–5.1)
Potassium: 3.3 mmol/L — ABNORMAL LOW (ref 3.5–5.1)
Sodium: 141 mmol/L (ref 135–145)
Sodium: 139 mmol/L (ref 135–145)

## 2021-10-30 LAB — URINE CULTURE: Culture: NO GROWTH

## 2021-10-30 LAB — CK: Total CK: 1059 U/L — ABNORMAL HIGH (ref 49–397)

## 2021-10-30 LAB — PHOSPHORUS: Phosphorus: 1.7 mg/dL — ABNORMAL LOW (ref 2.5–4.6)

## 2021-10-30 MED ORDER — POTASSIUM CHLORIDE 20 MEQ PO PACK
40.0000 meq | PACK | Freq: Once | ORAL | Status: AC
  Administered 2021-10-30: 40 meq
  Filled 2021-10-30: qty 2

## 2021-10-30 MED ORDER — DEXTROSE 5 % IV SOLN
30.0000 mmol | Freq: Once | INTRAVENOUS | Status: AC
  Administered 2021-10-30: 30 mmol via INTRAVENOUS
  Filled 2021-10-30: qty 10

## 2021-10-30 MED ORDER — POTASSIUM CHLORIDE 10 MEQ/100ML IV SOLN
10.0000 meq | INTRAVENOUS | Status: AC
  Administered 2021-10-30 (×4): 10 meq via INTRAVENOUS
  Filled 2021-10-30 (×4): qty 100

## 2021-10-30 MED ORDER — PANTOPRAZOLE 2 MG/ML SUSPENSION
40.0000 mg | Freq: Every day | ORAL | Status: DC
  Administered 2021-10-31 – 2021-11-05 (×5): 40 mg
  Filled 2021-10-30 (×5): qty 20

## 2021-10-30 MED ORDER — CALCIUM GLUCONATE-NACL 2-0.675 GM/100ML-% IV SOLN
2.0000 g | Freq: Once | INTRAVENOUS | Status: AC
  Administered 2021-10-30: 2000 mg via INTRAVENOUS
  Filled 2021-10-30: qty 100

## 2021-10-30 MED ORDER — FENTANYL BOLUS VIA INFUSION
50.0000 ug | INTRAVENOUS | Status: DC | PRN
  Administered 2021-10-30 – 2021-11-02 (×16): 100 ug via INTRAVENOUS
  Filled 2021-10-30: qty 100

## 2021-10-30 MED ORDER — MAGNESIUM SULFATE 4 GM/100ML IV SOLN
4.0000 g | Freq: Once | INTRAVENOUS | Status: AC
Start: 2021-10-30 — End: 2021-10-30
  Administered 2021-10-30: 4 g via INTRAVENOUS
  Filled 2021-10-30: qty 100

## 2021-10-30 MED ORDER — BETHANECHOL CHLORIDE 10 MG PO TABS
10.0000 mg | ORAL_TABLET | Freq: Three times a day (TID) | ORAL | Status: AC
  Administered 2021-10-30 – 2021-11-02 (×9): 10 mg
  Filled 2021-10-30 (×9): qty 1

## 2021-10-30 MED ORDER — FENTANYL CITRATE (PF) 100 MCG/2ML IJ SOLN: 50.0000 ug | Freq: Once | INTRAMUSCULAR | Status: DC

## 2021-10-30 MED ORDER — FENTANYL 2500MCG IN NS 250ML (10MCG/ML) PREMIX INFUSION
50.0000 ug/h | INTRAVENOUS | Status: DC
  Administered 2021-10-30: 100 ug/h via INTRAVENOUS
  Administered 2021-10-30: 125 ug/h via INTRAVENOUS
  Administered 2021-10-31 – 2021-11-01 (×2): 150 ug/h via INTRAVENOUS
  Administered 2021-11-01 – 2021-11-02 (×2): 200 ug/h via INTRAVENOUS
  Filled 2021-10-30 (×6): qty 250

## 2021-10-30 MED ORDER — DOCUSATE SODIUM 50 MG/5ML PO LIQD: 100.0000 mg | Freq: Two times a day (BID) | ORAL | Status: DC

## 2021-10-30 MED ORDER — LORAZEPAM 2 MG/ML IJ SOLN
1.0000 mg | INTRAMUSCULAR | Status: DC | PRN
  Administered 2021-10-30 – 2021-11-04 (×10): 1 mg via INTRAVENOUS
  Filled 2021-10-30 (×13): qty 1

## 2021-10-30 MED ORDER — LACTATED RINGERS IV BOLUS
1000.0000 mL | Freq: Once | INTRAVENOUS | Status: AC
  Administered 2021-10-30: 1000 mL via INTRAVENOUS

## 2021-10-30 MED ORDER — MAGNESIUM SULFATE 2 GM/50ML IV SOLN: 2.0000 g | Freq: Once | INTRAVENOUS | Status: DC

## 2021-10-30 MED ORDER — POLYETHYLENE GLYCOL 3350 17 G PO PACK: 17.0000 g | PACK | Freq: Every day | ORAL | Status: DC

## 2021-10-30 NOTE — Progress Notes (Addendum)
eLink Physician-Brief Progress Note ?Patient Name: Nicholas Estes ?DOB: 03-24-1961 ?MRN: [id number] ? ? ?Date of Service ? 10/30/2021  ?HPI/Events of Note ? Multiple issues: 1. Urinary retention - Pateint I/Oed several times over last 24 hours for residuals > 800. Now has > 800 mL residual again. 2. CK = 235 --> 1587. Etiology? Muscular activity vs bedrest vs ?  ?eICU Interventions ? Plan: ?Place Foley catheter. ?LR 1 liter IV over 1 hour now. ?Repeat CK at 8 AM  ? ? ? ?Intervention Category ?Major Interventions: Other: ? ?Rhyder Koegel Dennard Nip ?10/30/2021, 12:54 AM ?

## 2021-10-30 NOTE — Progress Notes (Signed)
eLink Physician-Brief Progress Note ?Patient Name: Nicholas Estes ?DOB: [birth year] ?MRN: [id number] ? ? ?Date of Service ? 10/30/2021  ?HPI/Events of Note ? Hypoxia - Nursing not able to hear breath sounds on L and sats in low 90's. Nursing asking for order for BMP as well.   ?eICU Interventions ? Plan: ?Portable CXR STAT. ?BMP now.   ? ? ? ?Intervention Category ?Major Interventions: Hypoxemia - evaluation and management ? ?Nicholas Estes ?10/30/2021, 5:18 AM ?

## 2021-10-30 NOTE — Progress Notes (Signed)
[facility] ADULT ICU REPLACEMENT PROTOCOL ? ? ?The patient does apply for the [facility] Adult ICU Electrolyte Replacment Protocol based on the criteria listed below:  ? ?1.Exclusion criteria: TCTS patients, ECMO patients, and Dialysis patients ?2. Is GFR >/= 30 ml/min? Yes.    ?Patient's GFR today is >60 ?3. Is SCr </= 2? Yes.   ?Patient's SCr is 0.97 mg/dL ?4. Did SCr increase >/= 0.5 in 24 hours? No. ?5.Pt's weight >40kg  Yes.   ?6. Abnormal electrolyte(s):   K 2.8, Mg 1.8  ?7. Electrolytes replaced per protocol ?8.  Call MD STAT for K+ </= 2.5, Phos </= 1, or Mag </= 1 ?Physician:  S. Sommer ? ?Boris Sharper Mishell Donalson 10/30/2021 7:04 AM ? ?

## 2021-10-30 NOTE — Progress Notes (Signed)
? ?NAME:  Nicholas Estes, MRN:  [id number], DOB:  May 22, 1961, LOS: 2 ?ADMISSION DATE:  10/28/2021, CONSULTATION DATE:  4/11 ?REFERRING MD:  FMTS , REASON FOR CONSULT: Agitation and hypotension  ? ?History of Present Illness:  ?Patient is encephalopathic and/or intubated. Therefore history has been obtained from chart review.  ? ?Nicholas Estes, is a 61 y.o. male, who presented to the [facility] ED with a chief complaint of Altered mental status ? ?They have a pertinent past medical history of ETOH abuse, afib, HTN, stroke, tobacco use, and conversion disorder, non epileptiform spells. ? ?Prior to arrival, he was reportedly visited by a friend and found altered with vomit near the patient. EMS reports of empty med bottles of oxcarbazepine and atorvastatin.  ? ?The patient was admitted to the [facility] service. No acute changes on head CT. Poison control contacted in the ED. The patient developed agitation while in the ED. The were given 2.5 of haldol and 2 mg of ativan. They then developed on isolated episode of hypotension (60/41). 1 L of IVF was given. ? ?PCCM was consulted for evaluation of hypotension and agitation.  Overnight patient became agitated, was transferred to ICU for close monitoring and Precedex infusion ? ?Pertinent  Medical History  ?Etoh use disorder ?Mood disorder ?Substance abuse ?HFpEF ?Tobacco use  ? ?Significant Hospital Events: ?Including procedures, antibiotic start and stop dates in addition to other pertinent events   ?4/11 ED via EMS for AMS. Admit to FPTS with concern for Dts. PCCM consult for agitation + medication related hypotension  ?4/12 intubated ? ?Interim History / Subjective:  ?Patient remained agitated overnight, requiring increasing sedation ?EEG was negative for seizures ?Remained afebrile ? ?Objective   ?Blood pressure 124/72, pulse 94, temperature 98.8 ?F (37.1 ?C), temperature source Axillary, resp. rate 18, height 5\' 11"  (1.803 m), weight 105.6 kg, SpO2 94 %. ?   ?Vent Mode: PRVC ?FiO2  (%):  [40 %-60 %] 40 % ?Set Rate:  [18 bmp] 18 bmp ?Vt Set:  [600 mL] 600 mL ?PEEP:  [5 cmH20-8 cmH20] 8 cmH20 ?Plateau Pressure:  [16 cmH20-19 cmH20] 17 cmH20  ? ?Intake/Output Summary (Last 24 hours) at 10/30/2021 1008 ?Last data filed at 10/30/2021 [id number] ?Gross per 24 hour  ?Intake 4211.66 ml  ?Output 3072 ml  ?Net 1139.66 ml  ? ?Filed Weights  ? 10/28/21 1028 10/28/21 2300 10/30/21 0500  ?Weight: 107.3 kg 104.3 kg 105.6 kg  ? ? ?Examination: ?  ?Physical exam: ?General: Crtitically ill-appearing obese male, orally intubated, intermittently gets agitated and restless ?HEENT: [place]/AT, eyes anicteric.  ETT and OGT in place ?Neuro: Eyes closed, does not open, not following commands, moving all 4 extremities ?Chest: Coarse breath sounds, no wheezes or rhonchi ?Heart: Regular rhythm, tachycardic, no murmurs or gallops ?Abdomen: Soft, nontender, nondistended, bowel sounds present ?Skin: No rash ? ? ?Resolved Hospital Problem list   ?Medication related hypotension, resolved ? ?Assessment & Plan:  ?Acute hypoxic/hypercapnic respiratory failure ?Aspiration pneumonia ?Patient was intubated and placed on mechanical ventilation ?Continue lung protective ventilation ?VAP bundle ?PAD protocol with RASS goal 0/-1, currently on Precedex and fentanyl infusion ?Continue IV antibiotic with Unasyn ?Follow-up respiratory culture ? ?Acute metabolic encephalopathy with agitated delirium  ?EtOH abuse, THC abuse, Mood disorder, conversion disorder ?Seizures were ruled out ?Patient remained encephalopathic ?High likelihood that he is withdrawing from alcohol ?EEG was negative for seizures ?He received 1 dose of Keppra yesterday, will hold off further antiseizure medications ?Continue phenobarbital protocol for alcohol withdrawal ?Continue thiamine ?Continue IV fluid ? ?  Acute rhabdomyolysis ?AKI due to severe dehydration and rhabdomyolysis ?Hypokalemia/hypomagnesemia/hypophosphatemia/hypocalcemia ?Patient CK level came back at 1500 ?Continue  aggressive IV fluid therapy ?Serum creatinine improved ?Continue aggressive electrolyte supplement ?Monitor intake and output ?Avoid nephrotoxic agents ? ?Morbid obesity ?Dietitian is following ?Continue tube feeds ? ?Best Practice (right click and "Reselect all SmartList Selections" daily)  ? ?Diet/type: NPO, Tube feeds ?DVT prophylaxis: LMWH ?GI prophylaxis: N/A ?Lines: N/A ?Foley:  N/A ?Code Status:  full code ?Last date of multidisciplinary goals of care discussion [4/12: Patient's son Kathlene NovemberMike was updated over the phone] ? ?Labs   ?CBC: ?Recent Labs  ?Lab 10/28/21 ?1016 10/28/21 ?1025 10/28/21 ?2357 10/29/21 ?0230 10/29/21 ?1104  ?WBC 14.8*  --   --  12.5*  --   ?NEUTROABS 12.1*  --   --   --   --   ?HGB 15.2 15.3 12.2* 12.7* 11.2*  ?HCT 43.9 45.0 36.0* 36.6* 33.0*  ?MCV 96.9  --   --  96.1  --   ?PLT 235  --   --  215  --   ? ? ?Basic Metabolic Panel: ?Recent Labs  ?Lab 10/28/21 ?1016 10/28/21 ?1025 10/28/21 ?1724 10/28/21 ?2357 10/29/21 ?0230 10/29/21 ?1104 10/29/21 ?1500 10/29/21 ?1811 10/30/21 ?[id number]  ?NA 136   < > 139 143 142 143  --   --  141  ?K 4.1   < > 3.2* 3.4* 3.7 3.6  --   --  2.8*  ?CL 97*  --  100  --  105  --   --   --  108  ?CO2 25  --  27  --  22  --   --   --  23  ?GLUCOSE 159*  --  119*  --  107*  --   --   --  120*  ?BUN 12  --  13  --  13  --   --   --  8  ?CREATININE 1.09  --  1.33*  --  1.42*  --   --   --  0.97  ?CALCIUM 9.0  --  8.7*  --  7.9*  --   --   --  7.6*  ?MG  --   --  1.6*  --   --   --  1.5* 1.6* 1.8  ?PHOS  --   --   --   --   --   --  2.7 2.7 1.7*  ? < > = values in this interval not displayed.  ? ?GFR: ?Estimated Creatinine Clearance: 98.9 mL/min (by C-G formula based on SCr of 0.97 mg/dL). ?Recent Labs  ?Lab 10/28/21 ?1016 10/29/21 ?0230  ?WBC 14.8* 12.5*  ?LATICACIDVEN 1.7  --   ? ? ?Liver Function Tests: ?Recent Labs  ?Lab 10/28/21 ?1016 10/28/21 ?1724 10/29/21 ?0230  ?AST 24 22 29   ?ALT 23 21 22   ?ALKPHOS 96 84 74  ?BILITOT 1.5* 1.0 1.0  ?PROT 7.3 6.8 6.2*  ?ALBUMIN 4.1  3.7 3.5  ? ?No results for input(s): LIPASE, AMYLASE in the last 168 hours. ?Recent Labs  ?Lab 10/28/21 ?1016  ?AMMONIA 28  ? ? ?ABG ?   ?Component Value Date/Time  ? PHART 7.397 10/29/2021 1104  ? PCO2ART 37.4 10/29/2021 1104  ? PO2ART 363 (H) 10/29/2021 1104  ? HCO3 23.0 10/29/2021 1104  ? TCO2 24 10/29/2021 1104  ? ACIDBASEDEF 2.0 10/29/2021 1104  ? O2SAT 100 10/29/2021 1104  ?  ? ?Coagulation Profile: ?Recent Labs  ?Lab 10/29/21 ?0230  ?INR 2.1*  ? ? ?  Cardiac Enzymes: ?Recent Labs  ?Lab 10/28/21 ?1724 10/29/21 ?1811 10/30/21 ?[id number]  ?CKTOTAL 235 1,587* 1,059*  ? ? ?HbA1C: ?Hgb A1c MFr Bld  ?Date/Time Value Ref Range Status  ?11/05/2019 05:52 AM 6.0 (H) 4.8 - 5.6 % Final  ?  Comment:  ?  (NOTE) ?Pre diabetes:          5.7%-6.4% ?Diabetes:              >6.4% ?Glycemic control for   <7.0% ?adults with diabetes ?  ? ? ?CBG: ?Recent Labs  ?Lab 10/29/21 ?1513 10/29/21 ?1928 10/30/21 ?0004 10/30/21 ?0402 10/30/21 ?[id number]  ?GLUCAP 97 96 90 115* 123*  ? ?Total critical care time: 39 minutes ? ?Performed by: Jacky Kindle ?  ?Critical care time was exclusive of separately billable procedures and treating other patients. ?  ?Critical care was necessary to treat or prevent imminent or life-threatening deterioration. ?  ?Critical care was time spent personally by me on the following activities: development of treatment plan with patient and/or surrogate as well as nursing, discussions with consultants, evaluation of patient's response to treatment, examination of patient, obtaining history from patient or surrogate, ordering and performing treatments and interventions, ordering and review of laboratory studies, ordering and review of radiographic studies, pulse oximetry and re-evaluation of patient's condition. ?  ?Jacky Kindle MD ?[facility] ?See Amion for pager ?If no response to pager, please call [phone] until 7pm ?After 7pm, Please call E-link [phone] ? ? ? ? ?

## 2021-10-30 NOTE — Progress Notes (Signed)
eLink Physician-Brief Progress Note ?Patient Name: Nicholas Estes ?DOB: 10-13-1960 ?MRN: [id number] ? ? ?Date of Service ? 10/30/2021  ?HPI/Events of Note ? Severe Agitation - Moving around in bed. At risk for self extubation. BP = 101/60 with HR = 94.  ?eICU Interventions ? Plan: ?Increase Ativan to 1 mg Q 2 hours PRN agitation. ?Increase ceiling on Precedex IV infusion to 1.6 mcg/kg/hour.  ? ? ? ?Intervention Category ?Major Interventions: Delirium, psychosis, severe agitation - evaluation and management ? ?Gifford Ballon Cornelia Copa ?10/30/2021, 1:30 AM ?

## 2021-10-30 NOTE — Procedures (Addendum)
Patient Name: Nicholas Estes  ?MRN: [id number]  ?Epilepsy Attending: Lora Havens  ?Referring Physician/Provider: Donnetta Simpers, MD ?Duration: 10/29/2021 1226 to 10/30/2021 0841 ?  ?Patient history: 61yo M with AMS. EEG to evaluate for seizure. ?  ?Level of alertness: lethargic  ?  ?AEDs during EEG study: Phenobarb, ativan ?  ?Technical aspects: This EEG study was done with scalp electrodes positioned according to the 10-20 International system of electrode placement. Electrical activity was acquired at a sampling rate of 500Hz  and reviewed with a high frequency filter of 70Hz  and a low frequency filter of 1Hz . EEG data were recorded continuously and digitally stored.  ?  ?Description: EEG showed intermittent generalized 8hz  alpha activity lasting 1-2 seconds alternating with low amplitude 2- 3Hz  delta slowing. Hyperventilation and photic stimulation were not performed.    ? ?Parts of study were difficult to interpret due to significant movement artifact. ?  ?ABNORMALITY ?- Continuous slow, generalized ?  ?IMPRESSION: ?This study is suggestive of moderate to severe diffuse encephalopathy, nonspecific etiology. No seizures or epileptiform discharges were seen throughout the recording. ?  ?Lora Havens  ?

## 2021-10-30 NOTE — Progress Notes (Signed)
Discontinued cEEG study.  Notified [facility] monitoring.  No skin breakdown observed. 

## 2021-10-30 NOTE — Progress Notes (Signed)
eLink Physician-Brief Progress Note ?Patient Name: Nicholas Estes ?DOB: [birth year] ?MRN: [id number] ? ? ?Date of Service ? 10/30/2021  ?HPI/Events of Note ? Review of portable CXR reveals retrocardiac left base atelectasis/infiltrate with possible small left effusion.  ?eICU Interventions ? Plan: ?Increase PEEP to 8.  ? ? ? ?Intervention Category ?Major Interventions: Hypoxemia - evaluation and management;Respiratory failure - evaluation and management ? ?Vibha Ferdig Dennard Nip ?10/30/2021, 6:16 AM ?

## 2021-10-30 NOTE — Hospital Course (Addendum)
Nicholas Estes is a 61 y.o.male with a history of alcohol abuse, tremors, A-fib on Eliquis, stroke, conversion disorder, pseudoseizures who was admitted to the [facility] Service at [facility] for acute encephalopathy. His hospital course is detailed below: ? ?Metabolic encephalopathy with agitated delirium thought to be secondary to overdose on several medication ?Patient presented with altered mental status and thick white emesis.  Patient had waxing and waning mental status with intermittent agitation.  Patient received Narcan x2 with no improvement.  Patient required Haldol to obtain CT head which did not show any acute intracranial abnormalities.  Patient was negative for alcohol, ammonia, Tylenol, and salicylate.  UDS was positive for THC.  Concern of medication overdose was raised given patient being found next to 2 empty bottles of medications.  Poison control was called and recommended supportive care with electrolyte monitoring.  Neurology was also consulted due to concerns of alcohol withdrawal and possible seizures. Patient was started on continuous EEG and loaded with Keppra. Critical care was consulted due to extreme agitation. Patient was eventually intubated and sedated.  He was extubated on 4/16.   ?The ultimate cause of his symptoms remained unclear, the most likely cause was an overdose of oxcarbazepine and atorvastatin as evidenced by his elevated oxcarbazepine level on admission and his subsequent development of rhabdomyolysis.  However patient denied any suicidal thoughts to psychiatry and was psychiatrically cleared. ?Patient remained quite weak and with limited mobility after the acute phase of his hospitalization. PT/OT recommended SNF but did not receive any bed offers for several days. Through working with PT/OT while in house, he was able to gain enough strength to discharge home with home health rehab services.  ? ?Acute hypoxic/hypercapnic respiratory failure ?Aspiration  pneumonia ?Patient became hypoxic with oxygenation saturation dropped down to the mid 80s on 5 L oxygen.  Patient had copious amounts of greenish secretion pooling in the back of his throat and given inability to protect his airway, decision was to be patient as well as undergo bronchoscopy which noted copious amounts of thick creamy secretions all over respiratory tract.  Patient was also started on IV Unasyn and aggressive IV fluids.  He received a total of 4 days of Unasyn before it was discontinued.  He remained comfortable on room air for the remainder of the hospitalization. ?  ?Rhabdomyolysis ?CK peaked 1587 and fluctuated while in the ICU. Received aggressive fluid resuscitation.  With resolution of his CK by 4/18.  This was thought to be secondary to a statin overdose. ? ?Ileus ?Patient had progressive abdominal pain and distention during his hospitalization. CT abdomen on 4/17 and 4/19 showed dilatation of both the stomach and the bowel.  There was some concern initially for SBO but the patient was noted to have stool output.  He was decompressed with an NG tube and then started on a soft diet as tolerated.  Patient was amenable to SNF, and was discharged to one in stable condition. ? ? ?PCP Follow-up Recommendations: ?patient endorsed depressed mood secondary to your recent divorce and his recent stroke.  He denied any acute suicidality but would benefit from close outpatient mental health follow-up. We are holding his Welbutrin on discharge due to concern for lowering his seizure threshold. Please reassess the appropriateness of re-starting this medication or changing medication classes.  ?We decreased the dose of the patient's oxcarbazepine to what appears to be the minimal effective dose, 300mg  BID. Please dose adjust as necessary.  ?Patient has arranged 24hour supervision at home  with his girlfriend for at least a few days post-discharge. Please ensure that he has the support necessary to continue  functioning safely at home.  ? ?

## 2021-10-31 ENCOUNTER — Inpatient Hospital Stay (HOSPITAL_COMMUNITY)

## 2021-10-31 DIAGNOSIS — R208 Other disturbances of skin sensation: Secondary | ICD-10-CM

## 2021-10-31 DIAGNOSIS — G9340 Encephalopathy, unspecified: Secondary | ICD-10-CM | POA: Diagnosis not present

## 2021-10-31 DIAGNOSIS — Z9911 Dependence on respirator [ventilator] status: Secondary | ICD-10-CM

## 2021-10-31 DIAGNOSIS — F10931 Alcohol use, unspecified with withdrawal delirium: Secondary | ICD-10-CM | POA: Diagnosis not present

## 2021-10-31 DIAGNOSIS — G9341 Metabolic encephalopathy: Secondary | ICD-10-CM

## 2021-10-31 DIAGNOSIS — J9601 Acute respiratory failure with hypoxia: Secondary | ICD-10-CM | POA: Diagnosis not present

## 2021-10-31 LAB — CBC
HCT: 35.8 % — ABNORMAL LOW (ref 39.0–52.0)
Hemoglobin: 12.3 g/dL — ABNORMAL LOW (ref 13.0–17.0)
MCH: 33.4 pg (ref 26.0–34.0)
MCHC: 34.4 g/dL (ref 30.0–36.0)
MCV: 97.3 fL (ref 80.0–100.0)
Platelets: 165 10*3/uL (ref 150–400)
RBC: 3.68 MIL/uL — ABNORMAL LOW (ref 4.22–5.81)
RDW: 12.6 % (ref 11.5–15.5)
WBC: 7.4 10*3/uL (ref 4.0–10.5)
nRBC: 0 % (ref 0.0–0.2)

## 2021-10-31 LAB — CULTURE, RESPIRATORY W GRAM STAIN: Culture: NORMAL

## 2021-10-31 LAB — CK: Total CK: 487 U/L — ABNORMAL HIGH (ref 49–397)

## 2021-10-31 LAB — BASIC METABOLIC PANEL
Anion gap: 11 (ref 5–15)
Anion gap: 7 (ref 5–15)
BUN: <5 mg/dL — ABNORMAL LOW (ref 8–23)
BUN: 8 mg/dL (ref 8–23)
CO2: 15 mmol/L — ABNORMAL LOW (ref 22–32)
CO2: 24 mmol/L (ref 22–32)
Calcium: 6.5 mg/dL — ABNORMAL LOW (ref 8.9–10.3)
Calcium: 7.2 mg/dL — ABNORMAL LOW (ref 8.9–10.3)
Chloride: 108 mmol/L (ref 98–111)
Chloride: 109 mmol/L (ref 98–111)
Creatinine, Ser: 0.37 mg/dL — ABNORMAL LOW (ref 0.61–1.24)
Creatinine, Ser: 0.76 mg/dL (ref 0.61–1.24)
GFR, Estimated: >60 mL/min (ref >60)
GFR, Estimated: >60 mL/min (ref >60)
Glucose, Bld: 79 mg/dL (ref 70–99)
Glucose, Bld: 104 mg/dL — ABNORMAL HIGH (ref 70–99)
Potassium: 3.7 mmol/L (ref 3.5–5.1)
Potassium: 3.8 mmol/L (ref 3.5–5.1)
Sodium: 134 mmol/L — ABNORMAL LOW (ref 135–145)
Sodium: 140 mmol/L (ref 135–145)

## 2021-10-31 LAB — POCT I-STAT 7, (LYTES, BLD GAS, ICA,H+H)
Acid-base deficit: 2 mmol/L (ref 0.0–2.0)
Bicarbonate: 23.2 mmol/L (ref 20.0–28.0)
Calcium, Ion: 1.1 mmol/L — ABNORMAL LOW (ref 1.15–1.40)
HCT: 34 % — ABNORMAL LOW (ref 39.0–52.0)
Hemoglobin: 11.6 g/dL — ABNORMAL LOW (ref 13.0–17.0)
O2 Saturation: 94 %
Patient temperature: 39.1
Potassium: 3.7 mmol/L (ref 3.5–5.1)
Sodium: 141 mmol/L (ref 135–145)
TCO2: 24 mmol/L (ref 22–32)
pCO2 arterial: 42.8 mmHg (ref 32–48)
pH, Arterial: 7.351 (ref 7.35–7.45)
pO2, Arterial: 82 mmHg — ABNORMAL LOW (ref 83–108)

## 2021-10-31 LAB — MAGNESIUM
Magnesium: 1.1 mg/dL — ABNORMAL LOW (ref 1.7–2.4)
Magnesium: 2.1 mg/dL (ref 1.7–2.4)

## 2021-10-31 LAB — GLUCOSE, CAPILLARY
Glucose-Capillary: 101 mg/dL — ABNORMAL HIGH (ref 70–99)
Glucose-Capillary: 119 mg/dL — ABNORMAL HIGH (ref 70–99)
Glucose-Capillary: 120 mg/dL — ABNORMAL HIGH (ref 70–99)
Glucose-Capillary: 138 mg/dL — ABNORMAL HIGH (ref 70–99)
Glucose-Capillary: 140 mg/dL — ABNORMAL HIGH (ref 70–99)
Glucose-Capillary: 152 mg/dL — ABNORMAL HIGH (ref 70–99)

## 2021-10-31 LAB — PHOSPHORUS
Phosphorus: 1.2 mg/dL — ABNORMAL LOW (ref 2.5–4.6)
Phosphorus: 3.2 mg/dL (ref 2.5–4.6)

## 2021-10-31 LAB — LACTIC ACID, PLASMA
Lactic Acid, Venous: 1.4 mmol/L (ref 0.5–1.9)
Lactic Acid, Venous: 1.3 mmol/L (ref 0.5–1.9)

## 2021-10-31 LAB — PHENOBARBITAL LEVEL: Phenobarbital: 9 ug/mL — ABNORMAL LOW (ref 15.0–30.0)

## 2021-10-31 MED ORDER — MAGNESIUM SULFATE 4 GM/100ML IV SOLN
4.0000 g | Freq: Once | INTRAVENOUS | Status: AC
  Administered 2021-10-31: 4 g via INTRAVENOUS
  Filled 2021-10-31: qty 100

## 2021-10-31 MED ORDER — VANCOMYCIN HCL 1250 MG/250ML IV SOLN
1250.0000 mg | Freq: Two times a day (BID) | INTRAVENOUS | Status: DC
  Administered 2021-10-31: 1250 mg via INTRAVENOUS
  Filled 2021-10-31 (×2): qty 250

## 2021-10-31 MED ORDER — SODIUM CHLORIDE 0.9 % IV SOLN
2.0000 g | Freq: Three times a day (TID) | INTRAVENOUS | Status: DC
  Administered 2021-10-31 – 2021-11-02 (×6): 2 g via INTRAVENOUS
  Filled 2021-10-31 (×7): qty 12.5

## 2021-10-31 MED ORDER — VANCOMYCIN HCL 2000 MG/400ML IV SOLN
2000.0000 mg | Freq: Once | INTRAVENOUS | Status: AC
  Administered 2021-10-31: 2000 mg via INTRAVENOUS
  Filled 2021-10-31: qty 400

## 2021-10-31 MED ORDER — POTASSIUM PHOSPHATES 15 MMOLE/5ML IV SOLN
45.0000 mmol | Freq: Once | INTRAVENOUS | Status: AC
  Administered 2021-10-31: 45 mmol via INTRAVENOUS
  Filled 2021-10-31: qty 15

## 2021-10-31 MED ORDER — POLYETHYLENE GLYCOL 3350 17 G PO PACK
17.0000 g | PACK | Freq: Two times a day (BID) | ORAL | Status: DC
  Administered 2021-10-31 – 2021-11-04 (×6): 17 g
  Filled 2021-10-31 (×6): qty 1

## 2021-10-31 MED ORDER — VANCOMYCIN HCL 2000 MG/400ML IV SOLN
2000.0000 mg | Freq: Once | INTRAVENOUS | Status: DC
  Filled 2021-10-31: qty 400

## 2021-10-31 MED ORDER — SENNA 8.6 MG PO TABS
1.0000 | ORAL_TABLET | Freq: Every day | ORAL | Status: DC
  Administered 2021-10-31 – 2021-11-04 (×3): 8.6 mg
  Filled 2021-10-31 (×5): qty 1

## 2021-10-31 MED ORDER — MAGNESIUM SULFATE 2 GM/50ML IV SOLN
2.0000 g | Freq: Once | INTRAVENOUS | Status: AC
  Administered 2021-10-31: 2 g via INTRAVENOUS
  Filled 2021-10-31: qty 50

## 2021-10-31 MED ORDER — FUROSEMIDE 10 MG/ML IJ SOLN
40.0000 mg | Freq: Once | INTRAMUSCULAR | Status: AC
  Administered 2021-10-31: 40 mg via INTRAVENOUS
  Filled 2021-10-31: qty 4

## 2021-10-31 MED ORDER — ACETAMINOPHEN 160 MG/5ML PO SOLN
650.0000 mg | Freq: Four times a day (QID) | ORAL | Status: DC | PRN
  Administered 2021-10-31 – 2021-11-01 (×2): 650 mg
  Filled 2021-10-31 (×2): qty 20.3

## 2021-10-31 MED ORDER — QUETIAPINE FUMARATE 50 MG PO TABS
50.0000 mg | ORAL_TABLET | Freq: Two times a day (BID) | ORAL | Status: DC
  Administered 2021-10-31: 50 mg via ORAL
  Filled 2021-10-31: qty 1

## 2021-10-31 MED ORDER — BISACODYL 10 MG RE SUPP: 10.0000 mg | Freq: Every day | RECTAL | Status: DC | PRN

## 2021-10-31 NOTE — Progress Notes (Signed)
EEG complete - results pending 

## 2021-10-31 NOTE — Progress Notes (Signed)
eLink Physician-Brief Progress Note ?Patient Name: Nicholas Estes ?DOB: 11/30/1960 ?MRN: [id number] ? ? ?Date of Service ? 10/31/2021  ?HPI/Events of Note ? RN notifying you of decreased UOP.  Pt had lasix on day shift w/1L UOP, only 130 ml so far this shift.  RN did bladder scan = 20ml.  MIVF were d/c'd on day shift.  ? ?AHRF, LLL aspiration pneumonitis. On Ventilator 50%.  ?A fib, 11 lit +. MAP good.  ?Cr 0.76. ? ? ?Camera eval done. In synchrony, MAP good. ?Not on pressors.  ?  ?eICU Interventions ? Continue current care ?Watch for now ?Follow Creatinine, labs in AM. ? ? ?Discussed with rN.  ? ? ? ?Intervention Category ?Intermediate Interventions: Oliguria - evaluation and management;Other: (Rhabdo) ? ?Ranee Gosselin ?10/31/2021, 11:33 PM ?

## 2021-10-31 NOTE — Progress Notes (Signed)
Right upper extremity arterial duplex completed. ?Refer to "CV Proc" under chart review to view preliminary results. ? ?10/31/2021 12:08 PM ?Kelby Aline., MHA, RVT, RDCS, RDMS   ?

## 2021-10-31 NOTE — Progress Notes (Signed)
Patient transported to MRI and back without complications.  

## 2021-10-31 NOTE — Progress Notes (Signed)
? ?NAME:  Nicholas Estes, MRN:  [id number], DOB:  May 26, 1961, LOS: 3 ?ADMISSION DATE:  10/28/2021, CONSULTATION DATE:  4/11 ?REFERRING MD:  FMTS , REASON FOR CONSULT: Agitation and hypotension  ? ?History of Present Illness:  ?Patient is encephalopathic and/or intubated. Therefore history has been obtained from chart review.  ? ?Nicholas Estes, is a 61 y.o. male, who presented to the [facility] ED with a chief complaint of acute encephalopathy ? ?They have a pertinent past medical history of ETOH abuse, afib, HTN, stroke, tobacco use, and conversion disorder, non epileptiform spells. ? ?Prior to arrival, he was reportedly visited by a friend and found altered with vomit near the patient. EMS reports of empty med bottles of oxcarbazepine and atorvastatin.  ? ?The patient was admitted to the [facility] service. No acute changes on head CT. Poison control contacted in the ED. The patient developed agitation while in the ED. The were given 2.5 of haldol and 2 mg of ativan. They then developed on isolated episode of hypotension (60/41). 1 L of IVF was given. ? ?PCCM was consulted for evaluation of hypotension and agitation, transferred to ICU for close monitoring and Precedex infusion ? ?Pertinent  Medical History  ?Etoh use disorder ?Mood disorder ?Substance abuse ?HFpEF ?Tobacco use  ? ?Significant Hospital Events: ?Including procedures, antibiotic start and stop dates in addition to other pertinent events   ?4/11 ED via EMS for AMS. Admit to FPTS with concern for Dts. PCCM consult for agitation + medication related hypotension  ?4/12 intubated ? ?Interim History / Subjective:  ?Deeply sedated on exam, febrile 38.5.  Net + 9.3 L this admission ? ? ?Objective   ?Blood pressure 107/60, pulse 79, temperature (!) 100.9 ?F (38.3 ?C), resp. rate 18, height 5\' 11"  (1.803 m), weight 106.2 kg, SpO2 98 %. ?   ?Vent Mode: PRVC ?FiO2 (%):  [40 %] 40 % ?Set Rate:  [18 bmp] 18 bmp ?Vt Set:  [600 mL] 600 mL ?PEEP:  [5 cmH20-8 cmH20] 5 cmH20 ?Plateau  Pressure:  [12 [id number] cmH20] 18 cmH20  ? ?Intake/Output Summary (Last 24 hours) at 10/31/2021 0914 ?Last data filed at 10/31/2021 0800 ?Gross per 24 hour  ?Intake 8156.98 ml  ?Output 1820 ml  ?Net 6336.98 ml  ? ? ?Filed Weights  ? 10/28/21 2300 10/30/21 0500 10/31/21 0320  ?Weight: 104.3 kg 105.6 kg 106.2 kg  ? ? ?Examination: ?  ?Physical exam: ?General: Adult male, intubated and sedated with RASS -4.  ?HEENT: /AT, eyes anicteric.  ETT and OGT in place ?Neuro: Eyes closed, does not open, not following commands, + cough/gag, weak w/d in extremities.  ?Chest: Coarse breath sounds, no wheezes or rhonchi ?Heart: Regular rhythm, tachycardic, no murmurs or gallops ?Abdomen: Soft, nontender, distended, bowel sounds present ?Skin: No rash ? ? ?Resolved Hospital Problem list   ?Medication related hypotension, resolved ? ?Assessment & Plan:  ?Acute hypoxic/hypercapnic respiratory failure ?Aspiration pneumonia ?Patient was intubated and placed on mechanical ventilation 4/12 ?Continue lung protective ventilation ?CO2 down to 15 on am labs, does not initiate breaths, may be related to sedation vs hyperventilation. ABG pending ?VAP bundle ?On Unasyn for aspiration, expand antibiotics, see below ? ?Acute metabolic encephalopathy with agitated delirium  ?EtOH abuse, THC abuse, Mood disorder, conversion disorder ?Seizures were ruled out ?CTH negative on presentation ?Etiology: suspected ETOH w/d ?EEG negative for seizures ?Remains encephalopathic with RASS -4 on precedex, fentanyl infusions and phenobarb taper. ?Continue thiamine ?Sedation vacation this morning to evaluate neurological exam.  ? ?Fever: ?T Max  38.5 this morning.  Patient has been on Unasyn for 3 days.  Expand antibiotics to Vanc x 1 and Cefepime. Blood Cx ordered, repeat CXR.  Differential for fever also includes precedex and CNS.  May need repeat imaging if neurological exam not improved on sedation vacation.  ? ?pAfib: ?Currently in SR, rate controlled ?Continue  home Eliquis ? ?Acute rhabdomyolysis ?AKI due to severe dehydration and rhabdomyolysis, resolved ?Hypokalemia/hypomagnesemia/hypophosphatemia/hypocalcemia ?+ 9 L this admission.  Repeat CK today. Start diuresis.  ?Phos and Mg replaced this morning, repeat lytes this afternoon ? ?Morbid obesity ?Dietitian is following ?Continue tube feeds ?Last stool PTA: on miralax, colace. Add PRN suppository ? ?Cool right upper extremity.  ?Able to palpate pulse ?Arterial ultrasound pending ? ?Previous stroke: ?Walks with walker at baseline with right sided weakness and neuropathy  ?Outpatient oxcarbazepine held ? ?BPH ?Home flomax on hold, urinary catheter in place ? ?Best Practice (right click and "Reselect all SmartList Selections" daily)  ? ?Diet/type: NPO, Tube feeds ?DVT prophylaxis: DOAC ?GI prophylaxis: PPI ?Lines: N/A ?Foley:  Yes, and it is still needed ?Code Status:  full code ?Last date of multidisciplinary goals of care discussion [4/12} ? ?Labs   ?CBC: ?Recent Labs  ?Lab 10/28/21 ?1016 10/28/21 ?1025 10/28/21 ?2357 10/29/21 ?0230 10/29/21 ?1104 10/31/21 ?0158  ?WBC 14.8*  --   --  12.5*  --  7.4  ?NEUTROABS 12.1*  --   --   --   --   --   ?HGB 15.2 15.3 12.2* 12.7* 11.2* 12.3*  ?HCT 43.9 45.0 36.0* 36.6* 33.0* 35.8*  ?MCV 96.9  --   --  96.1  --  97.3  ?PLT 235  --   --  215  --  165  ? ? ? ?Basic Metabolic Panel: ?Recent Labs  ?Lab 10/28/21 ?1724 10/28/21 ?2357 10/29/21 ?0230 10/29/21 ?1104 10/29/21 ?1500 10/29/21 ?1811 10/30/21 ?[id number] 10/30/21 ?1511 10/31/21 ?0023  ?NA 139   < > 142 143  --   --  141 139 134*  ?K 3.2*   < > 3.7 3.6  --   --  2.8* 3.3* 3.7  ?CL 100  --  105  --   --   --  108 108 108  ?CO2 27  --  22  --   --   --  23 24 15*  ?GLUCOSE 119*  --  107*  --   --   --  120* 124* 79  ?BUN 13  --  13  --   --   --  8 5* <5*  ?CREATININE 1.33*  --  1.42*  --   --   --  0.97 0.85 0.37*  ?CALCIUM 8.7*  --  7.9*  --   --   --  7.6* 7.6* 6.5*  ?MG 1.6*  --   --   --  1.5* 1.6* 1.8  --  1.1*  ?PHOS  --   --   --    --  2.7 2.7 1.7*  --  1.2*  ? < > = values in this interval not displayed.  ? ? ?GFR: ?Estimated Creatinine Clearance: 120.3 mL/min (A) (by C-G formula based on SCr of 0.37 mg/dL (L)). ?Recent Labs  ?Lab 10/28/21 ?1016 10/29/21 ?0230 10/31/21 ?0158  ?WBC 14.8* 12.5* 7.4  ?LATICACIDVEN 1.7  --   --   ? ? ? ?Liver Function Tests: ?Recent Labs  ?Lab 10/28/21 ?1016 10/28/21 ?1724 10/29/21 ?0230  ?AST 24 22 29   ?ALT 23 21 22   ?  ALKPHOS 96 84 74  ?BILITOT 1.5* 1.0 1.0  ?PROT 7.3 6.8 6.2*  ?ALBUMIN 4.1 3.7 3.5  ? ? ?No results for input(s): LIPASE, AMYLASE in the last 168 hours. ?Recent Labs  ?Lab 10/28/21 ?1016  ?AMMONIA 28  ? ? ? ?ABG ?   ?Component Value Date/Time  ? PHART 7.397 10/29/2021 1104  ? PCO2ART 37.4 10/29/2021 1104  ? PO2ART 363 (H) 10/29/2021 1104  ? HCO3 23.0 10/29/2021 1104  ? TCO2 24 10/29/2021 1104  ? ACIDBASEDEF 2.0 10/29/2021 1104  ? O2SAT 100 10/29/2021 1104  ? ?  ? ?Coagulation Profile: ?Recent Labs  ?Lab 10/29/21 ?0230  ?INR 2.1*  ? ? ? ?Cardiac Enzymes: ?Recent Labs  ?Lab 10/28/21 ?1724 10/29/21 ?1811 10/30/21 ?[id number]  ?[id number]* 1,059*  ? ? ? ?HbA1C: ?Hgb A1c MFr Bld  ?Date/Time Value Ref Range Status  ?11/05/2019 05:52 AM 6.0 (H) 4.8 - 5.6 % Final  ?  Comment:  ?  (NOTE) ?Pre diabetes:          5.7%-6.4% ?Diabetes:              >6.4% ?Glycemic control for   <7.0% ?adults with diabetes ?  ? ? ?CBG: ?Recent Labs  ?Lab 10/30/21 ?1551 10/30/21 ?1933 10/30/21 ?2347 10/31/21 ?0329 10/31/21 ?0740  ?GLUCAP 136* 135* 128* 140* 152*  ? ? ?Total critical care time: 38 minutes ? ?Performed by: Paulita Fujita ?  ?Critical care time was exclusive of separately billable procedures and treating other patients. ?  ?Critical care was necessary to treat or prevent imminent or life-threatening deterioration. ?  ?Critical care was time spent personally by me on the following activities: development of treatment plan with patient and/or surrogate as well as nursing, discussions with consultants, evaluation of  patient's response to treatment, examination of patient, obtaining history from patient or surrogate, ordering and performing treatments and interventions, ordering and review of laboratory studies,

## 2021-10-31 NOTE — Progress Notes (Signed)
[facility] ADULT ICU REPLACEMENT PROTOCOL ? ? ?The patient does apply for the [facility] Adult ICU Electrolyte Replacment Protocol based on the criteria listed below:  ? ?1.Exclusion criteria: TCTS patients, ECMO patients, and Dialysis patients ?2. Is GFR >/= 30 ml/min? Yes.    ?Patient's GFR today is >60 ?3. Is SCr </= 2? Yes.   ?Patient's SCr is 0.37 mg/dL ?4. Did SCr increase >/= 0.5 in 24 hours? No. ?5.Pt's weight >40kg  Yes.   ?6. Abnormal electrolyte(s):   K 3.7, Mg 1.1, Phos 1.2 ?7. Electrolytes replaced per protocol ?8.  Call MD STAT for K+ </= 2.5, Phos </= 1, or Mag </= 1 ?Physician:  E. Aventura ? ?Nicholas Estes 10/31/2021 1:37 AM ? ?

## 2021-10-31 NOTE — Progress Notes (Signed)
Pharmacy Antibiotic Note ? ?Nicholas Estes is a 61 y.o. male admitted on 10/28/2021 with  AMS  Patient was intubated and Unasyn started for concerns of aspirationa pneumonia. Now concerned for sepsis. Pharmacy has been consulted for vancomycin and cefepime dosing. ? ?WBC is within normal limits, decreased from 12 to 7 this AM. Patient is febrile this AM with temp of 100.62F. Renal function is at baseline. ? ?Plan: ?Stop Unasyn IV ?Start Cefepime 2g IV q8h ?Vancomycin IV 2g x 1 ?Start Vancomycin IV 1250g q 12h (eAUC 522, Scr 0.8, 0.5 L/kg) ?Monitor renal function and signs of clinical improvement ?F/u blood cultures and de-escalate antibiotics as able ? ?Height: 5\' 11"  (180.3 cm) ?Weight: 106.2 kg (234 lb 2.1 oz) ?IBW/kg (Calculated) : 75.3 ? ?Temp (24hrs), Avg:99.7 ?F (37.6 ?C), Min:98.8 ?F (37.1 ?C), Max:100.9 ?F (38.3 ?C) ? ?Recent Labs  ?Lab 10/28/21 ?1016 10/28/21 ?1724 10/29/21 ?0230 10/30/21 ?[id number] 10/30/21 ?1511 10/31/21 ?0023 10/31/21 ?0158  ?WBC 14.8*  --  12.5*  --   --   --  7.4  ?CREATININE 1.09 1.33* 1.42* 0.97 0.85 0.37*  --   ?LATICACIDVEN 1.7  --   --   --   --   --   --   ?  ?Estimated Creatinine Clearance: 120.3 mL/min (A) (by C-G formula based on SCr of 0.37 mg/dL (L)).   ? ?No Known Allergies ? ?Antimicrobials this admission: ?4/11 Unasyn >> 4/14 ?4/11 cefepime >>  ?4/11 vancomycin >> ? ?Microbiology results: ?4/14 BCx: pending ?4/12 UCx: ngtd  ?4/12 Bronch aspirate: pending  ?4/12 MRSA PCR: negative ? ?Thank you for involving pharmacy in this patient's care. ? ?Elita Quick, PharmD ?PGY1 Ambulatory Care Pharmacy Resident ?10/31/2021 9:56 AM ? ?**Pharmacist phone directory can be found on [email or web address] listed under [place]** ?

## 2021-10-31 NOTE — Procedures (Signed)
Patient Name: Nicholas Estes  ?MRN: [id number]  ?Epilepsy Attending: Lora Havens  ?Referring Physician/Provider: Minda Ditto, NP ?Date: 10/31/2021 ?Duration: 29.05 mins ? ?Patient history: 61yo M with AMS. EEG to evaluate for seizure. ?  ?Level of alertness: lethargic  ?  ?AEDs during EEG study: Phenobarb, ativan ?  ?Technical aspects: This EEG study was done with scalp electrodes positioned according to the 10-20 International system of electrode placement. Electrical activity was acquired at a sampling rate of 500Hz  and reviewed with a high frequency filter of 70Hz  and a low frequency filter of 1Hz . EEG data were recorded continuously and digitally stored.  ?  ?Description: EEG showed continuous generalized low amplitude 2 to 3.5 Hz delta slowing.  Hyperventilation and photic stimulation were not performed.    ? ?Patient event was recorded on 10/31/2021 at 1450 during which patient was noted to have a poor EEG patient with side-to-side head shaking.  Concomitant EEG before, during and after the event was limited due to significant movement artifact but after filtering the artifact did not show any EEG evidence to suggest seizure. ?  ?ABNORMALITY ?- Continuous slow, generalized ?  ?IMPRESSION: ?This study is suggestive of severe diffuse encephalopathy, nonspecific etiology. No seizures or epileptiform discharges were seen throughout the recording. ? ?Patient event was recorded on 10/31/2021 at 1450 as described above without concomitant EEG change.  This was not an epileptic event. ?  ?Lora Havens  ?

## 2021-11-01 ENCOUNTER — Inpatient Hospital Stay (HOSPITAL_COMMUNITY)

## 2021-11-01 DIAGNOSIS — G9340 Encephalopathy, unspecified: Secondary | ICD-10-CM | POA: Diagnosis not present

## 2021-11-01 LAB — CBC
HCT: 35.3 % — ABNORMAL LOW (ref 39.0–52.0)
Hemoglobin: 12 g/dL — ABNORMAL LOW (ref 13.0–17.0)
MCH: 33.4 pg (ref 26.0–34.0)
MCHC: 34 g/dL (ref 30.0–36.0)
MCV: 98.3 fL (ref 80.0–100.0)
Platelets: 167 10*3/uL (ref 150–400)
RBC: 3.59 MIL/uL — ABNORMAL LOW (ref 4.22–5.81)
RDW: 12.8 % (ref 11.5–15.5)
WBC: 7 10*3/uL (ref 4.0–10.5)
nRBC: 0 % (ref 0.0–0.2)

## 2021-11-01 LAB — BASIC METABOLIC PANEL
Anion gap: 6 (ref 5–15)
BUN: 15 mg/dL (ref 8–23)
CO2: 23 mmol/L (ref 22–32)
Calcium: 7.5 mg/dL — ABNORMAL LOW (ref 8.9–10.3)
Chloride: 112 mmol/L — ABNORMAL HIGH (ref 98–111)
Creatinine, Ser: 0.89 mg/dL (ref 0.61–1.24)
GFR, Estimated: >60 mL/min (ref >60)
Glucose, Bld: 148 mg/dL — ABNORMAL HIGH (ref 70–99)
Potassium: 4.5 mmol/L (ref 3.5–5.1)
Sodium: 141 mmol/L (ref 135–145)

## 2021-11-01 LAB — CK: Total CK: 1227 U/L — ABNORMAL HIGH (ref 49–397)

## 2021-11-01 LAB — CALCIUM, IONIZED: Calcium, Ionized, Serum: 4.4 mg/dL — ABNORMAL LOW (ref 4.5–5.6)

## 2021-11-01 LAB — GLUCOSE, CAPILLARY
Glucose-Capillary: 168 mg/dL — ABNORMAL HIGH (ref 70–99)
Glucose-Capillary: 145 mg/dL — ABNORMAL HIGH (ref 70–99)
Glucose-Capillary: 154 mg/dL — ABNORMAL HIGH (ref 70–99)
Glucose-Capillary: 139 mg/dL — ABNORMAL HIGH (ref 70–99)
Glucose-Capillary: 101 mg/dL — ABNORMAL HIGH (ref 70–99)
Glucose-Capillary: 93 mg/dL (ref 70–99)

## 2021-11-01 LAB — MAGNESIUM: Magnesium: 2 mg/dL (ref 1.7–2.4)

## 2021-11-01 LAB — PHOSPHORUS: Phosphorus: 4 mg/dL (ref 2.5–4.6)

## 2021-11-01 MED ORDER — APIXABAN 5 MG PO TABS
5.0000 mg | ORAL_TABLET | Freq: Two times a day (BID) | ORAL | Status: DC
  Administered 2021-11-01 – 2021-11-02 (×3): 5 mg
  Filled 2021-11-01 (×3): qty 1

## 2021-11-01 MED ORDER — SORBITOL 70 % SOLN
960.0000 mL | TOPICAL_OIL | Freq: Once | ORAL | Status: AC
  Administered 2021-11-01: 960 mL via RECTAL
  Filled 2021-11-01: qty 473

## 2021-11-01 MED ORDER — QUETIAPINE FUMARATE 50 MG PO TABS
50.0000 mg | ORAL_TABLET | Freq: Two times a day (BID) | ORAL | Status: DC
  Administered 2021-11-01 – 2021-11-03 (×5): 50 mg
  Filled 2021-11-01 (×5): qty 1

## 2021-11-01 MED ORDER — DEXMEDETOMIDINE HCL IN NACL 400 MCG/100ML IV SOLN
0.4000 ug/kg/h | INTRAVENOUS | Status: DC
  Administered 2021-11-01 – 2021-11-02 (×12): 1.2 ug/kg/h via INTRAVENOUS
  Administered 2021-11-03: 0.6 ug/kg/h via INTRAVENOUS
  Administered 2021-11-03: 1.2 ug/kg/h via INTRAVENOUS
  Administered 2021-11-03: 0.6 ug/kg/h via INTRAVENOUS
  Filled 2021-11-01 (×12): qty 100
  Filled 2021-11-01: qty 200
  Filled 2021-11-01: qty 100

## 2021-11-01 MED ORDER — SIMETHICONE 40 MG/0.6ML PO SUSP
80.0000 mg | Freq: Four times a day (QID) | ORAL | Status: DC | PRN
  Administered 2021-11-01 – 2021-11-04 (×2): 80 mg
  Filled 2021-11-01 (×4): qty 1.2

## 2021-11-01 NOTE — Progress Notes (Signed)
? ?NAME:  Nicholas Estes, MRN:  [id number], DOB:  05-11-1961, LOS: 4 ?ADMISSION DATE:  10/28/2021, CONSULTATION DATE:  4/11 ?REFERRING MD:  FMTS , REASON FOR CONSULT: Agitation and hypotension  ? ?History of Present Illness:  ?Patient is encephalopathic and/or intubated. Therefore history has been obtained from chart review.  ? ?Nicholas Estes, is a 61 y.o. male, who presented to the [facility] ED with a chief complaint of Altered mental status ? ?They have a pertinent past medical history of ETOH abuse, afib, HTN, stroke, tobacco use, and conversion disorder, non epileptiform spells. ? ?Prior to arrival, he was reportedly visited by a friend and found altered with vomit near the patient. EMS reports of empty med bottles of oxcarbazepine and atorvastatin.  ? ?The patient was admitted to the [facility] service. No acute changes on head CT. Poison control contacted in the ED. The patient developed agitation while in the ED. The were given 2.5 of haldol and 2 mg of ativan. They then developed on isolated episode of hypotension (60/41). 1 L of IVF was given. ? ?PCCM was consulted for evaluation of hypotension and agitation.  Overnight patient became agitated, was transferred to ICU for close monitoring and Precedex infusion ? ?Pertinent  Medical History  ?Etoh use disorder ?Mood disorder ?Substance abuse ?HFpEF ?Tobacco use  ? ?Significant Hospital Events: ?Including procedures, antibiotic start and stop dates in addition to other pertinent events   ?4/11 ED via EMS for AMS. Admit to FPTS with concern for Dts. PCCM consult for agitation + medication related hypotension  ?4/12 intubated ? ? ?Interim History / Subjective:  ?On sedation ?No overnight events ?Has not had any bowel motions, abdomen is full and tympanitic ? ?Objective   ?Blood pressure 107/71, pulse 77, temperature 99.9 ?F (37.7 ?C), resp. rate 18, height 5\' 11"  (1.803 m), weight 110.8 kg, SpO2 98 %. ?   ?Vent Mode: PRVC ?FiO2 (%):  [40 %-70 %] 50 % ?Set Rate:  [18 bmp] 18  bmp ?Vt Set:  [600 mL] 600 mL ?PEEP:  [5 cmH20] 5 cmH20 ?Plateau Pressure:  [14 cmH20-25 cmH20] 25 cmH20  ? ?Intake/Output Summary (Last 24 hours) at 11/01/2021 0810 ?Last data filed at 11/01/2021 0700 ?Gross per 24 hour  ?Intake 3876.22 ml  ?Output 1130 ml  ?Net 2746.22 ml  ? ?Filed Weights  ? 10/30/21 0500 10/31/21 0320 11/01/21 0500  ?Weight: 105.6 kg 106.2 kg 110.8 kg  ? ? ?Examination: ?  ?Physical exam: Middle-age, does not appear to be in distress, sedated ?General: Appears comfortable ?HEENT: Moist oral mucosa, endotracheal tube in place, OG tube in place ?Neuro: Not following commands ?Chest: Clear breath sounds, no rales ?Heart: S1-S2 appreciated ?Abdomen: Soft, nontender, nondistended, bowel sounds present ?Skin: No rash ? ?Resolved Hospital Problem list   ?Medication related hypotension, resolved ? ?Assessment & Plan:  ? ?Acute hypoxic/hypercapnic respiratory failure ?Aspiration pneumonia ?-Continue ventilator support ?-VAP bundle in place ?-Sedation with RASS goal of 0/-1 ?-Continue Unasyn ? ?Metabolic encephalopathy with agitated delirium ?Does have a history of EtOH abuse ?No seizures on EEG last 72 hours ?-Concern for alcohol withdrawal ?-Not currently on any antiseizure medications ?-Continue thiamine, multivitamin, IV fluids ? ?Rhabdomyolysis ?-CK was clearing but did spike a little bit ?-BUN/creatinine remained stable ?-Will continue to monitor ? ?Constipation, abdominal distention ?KUB reveals significant gaseous patterns ?-Bowel sounds appreciated, not hyperactive ?-Enema ? ?Morbid obesity ? ?Risk of malnutrition ?-Continue tube feeds ? ?We will continue to wean however will not plan on extubating at present, likely with  significant FRC reduction with his significant abdominal distention ? ? ?Best Practice (right click and "Reselect all SmartList Selections" daily)  ? ?Diet/type: tubefeeds, Tube feeds ?DVT prophylaxis: LMWH ?GI prophylaxis: N/A ?Lines: N/A ?Foley:  N/A ?Code Status:  full  code ?Last date of multidisciplinary goals of care discussion [4/12: Patient's son Ronalee Belts was updated over the phone] ? ?Labs   ?CBC: ?Recent Labs  ?Lab 10/28/21 ?1016 10/28/21 ?1025 10/29/21 ?0230 10/29/21 ?1104 10/31/21 ?0158 10/31/21 ?1143 11/01/21 ?[id number]  ?WBC 14.8*  --  12.5*  --  7.4  --  7.0  ?NEUTROABS 12.1*  --   --   --   --   --   --   ?HGB 15.2   < > 12.7* 11.2* 12.3* 11.6* 12.0*  ?HCT 43.9   < > 36.6* 33.0* 35.8* 34.0* 35.3*  ?MCV 96.9  --  96.1  --  97.3  --  98.3  ?PLT 235  --  215  --  165  --  167  ? < > = values in this interval not displayed.  ? ? ?Basic Metabolic Panel: ?Recent Labs  ?Lab 10/29/21 ?0230 10/29/21 ?1104 10/29/21 ?1500 10/29/21 ?1811 10/30/21 ?[id number] 10/30/21 ?1511 10/31/21 ?0023 10/31/21 ?1143 10/31/21 ?1245  ?NA 142   < >  --   --  141 139 134* 141 140  ?K 3.7   < >  --   --  2.8* 3.3* 3.7 3.7 3.8  ?CL 105  --   --   --  108 108 108  --  109  ?CO2 22  --   --   --  23 24 15*  --  24  ?GLUCOSE 107*  --   --   --  120* 124* 79  --  104*  ?BUN 13  --   --   --  8 5* <5*  --  8  ?CREATININE 1.42*  --   --   --  0.97 0.85 0.37*  --  0.76  ?CALCIUM 7.9*  --   --   --  7.6* 7.6* 6.5*  --  7.2*  ?MG  --   --  1.5* 1.6* 1.8  --  1.1*  --  2.1  ?PHOS  --   --  2.7 2.7 1.7*  --  1.2*  --  3.2  ? < > = values in this interval not displayed.  ? ?GFR: ?Estimated Creatinine Clearance: 122.8 mL/min (by C-G formula based on SCr of 0.76 mg/dL). ?Recent Labs  ?Lab 10/28/21 ?1016 10/29/21 ?0230 10/31/21 ?0158 10/31/21 ?[id number] 10/31/21 ?1245 11/01/21 ?[id number]  ?WBC 14.8* 12.5* 7.4  --   --  7.0  ?LATICACIDVEN 1.7  --   --  1.4 1.3  --   ? ? ?Liver Function Tests: ?Recent Labs  ?Lab 10/28/21 ?1016 10/28/21 ?1724 10/29/21 ?0230  ?AST 24 22 29   ?ALT 23 21 22   ?ALKPHOS 96 84 74  ?BILITOT 1.5* 1.0 1.0  ?PROT 7.3 6.8 6.2*  ?ALBUMIN 4.1 3.7 3.5  ? ?No results for input(s): LIPASE, AMYLASE in the last 168 hours. ?Recent Labs  ?Lab 10/28/21 ?1016  ?AMMONIA 28  ? ? ?ABG ?   ?Component Value Date/Time  ? PHART 7.351  10/31/2021 1143  ? PCO2ART 42.8 10/31/2021 1143  ? PO2ART 82 (L) 10/31/2021 1143  ? HCO3 23.2 10/31/2021 1143  ? TCO2 24 10/31/2021 1143  ? ACIDBASEDEF 2.0 10/31/2021 1143  ? O2SAT 94 10/31/2021 1143  ?  ? ?Coagulation Profile: ?Recent Labs  ?  Lab 10/29/21 ?0230  ?INR 2.1*  ? ? ?Cardiac Enzymes: ?Recent Labs  ?Lab 10/28/21 ?1724 10/29/21 ?1811 10/30/21 ?[id number] 10/31/21 ?1245  ?CKTOTAL 235 1,587* 1,059* 487*  ? ? ?HbA1C: ?Hgb A1c MFr Bld  ?Date/Time Value Ref Range Status  ?11/05/2019 05:52 AM 6.0 (H) 4.8 - 5.6 % Final  ?  Comment:  ?  (NOTE) ?Pre diabetes:          5.7%-6.4% ?Diabetes:              >6.4% ?Glycemic control for   <7.0% ?adults with diabetes ?  ? ? ?CBG: ?Recent Labs  ?Lab 10/31/21 ?1539 10/31/21 ?1940 10/31/21 ?2342 11/01/21 ?0346 11/01/21 ?0744  ?GLUCAP 138* 120* 119* 168* 145*  ? ?The patient is critically ill with multiple organ systems failure and requires high complexity decision making for assessment and support, frequent evaluation and titration of therapies, application of advanced monitoring technologies and extensive interpretation of multiple databases. Critical Care Time devoted to patient care services described in this note independent of APP/resident time (if applicable)  is 32 minutes.  ? ?Sherrilyn Rist MD ?[facility] ?Personal pager: See Shea Evans ?If unanswered, please page ?CCM On-call: [phone] ? ? ?

## 2021-11-02 DIAGNOSIS — G9340 Encephalopathy, unspecified: Secondary | ICD-10-CM | POA: Diagnosis not present

## 2021-11-02 LAB — BASIC METABOLIC PANEL
Anion gap: 7 (ref 5–15)
BUN: 13 mg/dL (ref 8–23)
CO2: 24 mmol/L (ref 22–32)
Calcium: 7.9 mg/dL — ABNORMAL LOW (ref 8.9–10.3)
Chloride: 111 mmol/L (ref 98–111)
Creatinine, Ser: 0.77 mg/dL (ref 0.61–1.24)
GFR, Estimated: >60 mL/min (ref >60)
Glucose, Bld: 108 mg/dL — ABNORMAL HIGH (ref 70–99)
Potassium: 4.5 mmol/L (ref 3.5–5.1)
Sodium: 142 mmol/L (ref 135–145)

## 2021-11-02 LAB — CBC
HCT: 35.2 % — ABNORMAL LOW (ref 39.0–52.0)
Hemoglobin: 12.1 g/dL — ABNORMAL LOW (ref 13.0–17.0)
MCH: 33.9 pg (ref 26.0–34.0)
MCHC: 34.4 g/dL (ref 30.0–36.0)
MCV: 98.6 fL (ref 80.0–100.0)
Platelets: 180 10*3/uL (ref 150–400)
RBC: 3.57 MIL/uL — ABNORMAL LOW (ref 4.22–5.81)
RDW: 12.8 % (ref 11.5–15.5)
WBC: 6.9 10*3/uL (ref 4.0–10.5)
nRBC: 0 % (ref 0.0–0.2)

## 2021-11-02 LAB — GLUCOSE, CAPILLARY
Glucose-Capillary: 102 mg/dL — ABNORMAL HIGH (ref 70–99)
Glucose-Capillary: 104 mg/dL — ABNORMAL HIGH (ref 70–99)
Glucose-Capillary: 104 mg/dL — ABNORMAL HIGH (ref 70–99)
Glucose-Capillary: 107 mg/dL — ABNORMAL HIGH (ref 70–99)
Glucose-Capillary: 109 mg/dL — ABNORMAL HIGH (ref 70–99)
Glucose-Capillary: 95 mg/dL (ref 70–99)

## 2021-11-02 LAB — MAGNESIUM: Magnesium: 2 mg/dL (ref 1.7–2.4)

## 2021-11-02 MED ORDER — AMIODARONE IV BOLUS ONLY 150 MG/100ML
150.0000 mg | Freq: Once | INTRAVENOUS | Status: AC
  Administered 2021-11-02: 150 mg via INTRAVENOUS
  Filled 2021-11-02: qty 100

## 2021-11-02 MED ORDER — CHLORHEXIDINE GLUCONATE 0.12 % MT SOLN
15.0000 mL | Freq: Two times a day (BID) | OROMUCOSAL | Status: DC
  Administered 2021-11-02 – 2021-11-11 (×12): 15 mL via OROMUCOSAL
  Filled 2021-11-02 (×10): qty 15

## 2021-11-02 MED ORDER — STERILE WATER FOR INJECTION IJ SOLN
INTRAMUSCULAR | Status: AC
  Administered 2021-11-02: 1 mL
  Filled 2021-11-02: qty 10

## 2021-11-02 MED ORDER — AMIODARONE HCL IN DEXTROSE 360-4.14 MG/200ML-% IV SOLN
60.0000 mg/h | INTRAVENOUS | Status: AC
Start: 2021-11-02 — End: 2021-11-02
  Administered 2021-11-02: 60 mg/h via INTRAVENOUS
  Filled 2021-11-02 (×2): qty 200

## 2021-11-02 MED ORDER — MORPHINE SULFATE (PF) 2 MG/ML IV SOLN
2.0000 mg | INTRAVENOUS | Status: DC | PRN
  Administered 2021-11-05: 2 mg via INTRAVENOUS
  Filled 2021-11-02: qty 1

## 2021-11-02 MED ORDER — ENOXAPARIN SODIUM 120 MG/0.8ML IJ SOSY
110.0000 mg | PREFILLED_SYRINGE | Freq: Two times a day (BID) | INTRAMUSCULAR | Status: DC
  Administered 2021-11-02 – 2021-11-05 (×7): 110 mg via SUBCUTANEOUS
  Filled 2021-11-02 (×9): qty 0.74

## 2021-11-02 MED ORDER — ORAL CARE MOUTH RINSE
15.0000 mL | Freq: Two times a day (BID) | OROMUCOSAL | Status: DC
  Administered 2021-11-03 – 2021-11-11 (×8): 15 mL via OROMUCOSAL

## 2021-11-02 MED ORDER — FENTANYL CITRATE (PF) 100 MCG/2ML IJ SOLN
100.0000 ug | Freq: Once | INTRAMUSCULAR | Status: AC
  Administered 2021-11-02: 100 ug via INTRAVENOUS
  Filled 2021-11-02: qty 2

## 2021-11-02 MED ORDER — AMIODARONE HCL IN DEXTROSE 360-4.14 MG/200ML-% IV SOLN
30.0000 mg/h | INTRAVENOUS | Status: DC
  Administered 2021-11-02 – 2021-11-05 (×6): 30 mg/h via INTRAVENOUS
  Filled 2021-11-02 (×5): qty 200

## 2021-11-02 MED ORDER — METOPROLOL TARTRATE 5 MG/5ML IV SOLN
5.0000 mg | Freq: Once | INTRAVENOUS | Status: AC
  Administered 2021-11-02: 5 mg via INTRAVENOUS
  Filled 2021-11-02: qty 5

## 2021-11-02 MED ORDER — ZIPRASIDONE MESYLATE 20 MG IM SOLR
10.0000 mg | Freq: Once | INTRAMUSCULAR | Status: AC
  Administered 2021-11-02: 10 mg via INTRAMUSCULAR
  Filled 2021-11-02: qty 20

## 2021-11-02 NOTE — Progress Notes (Signed)
? ?NAME:  Nicholas Estes, MRN:  [id number], DOB:  Dec 04, 1960, LOS: 5 ?ADMISSION DATE:  10/28/2021, CONSULTATION DATE:  4/11 ?REFERRING MD:  FMTS , REASON FOR CONSULT: Agitation and hypotension  ? ?History of Present Illness:  ?Patient is encephalopathic and/or intubated. Therefore history has been obtained from chart review.  ? ?Nicholas Estes, is a 61 y.o. male, who presented to the [facility] ED with a chief complaint of Altered mental status ? ?They have a pertinent past medical history of ETOH abuse, afib, HTN, stroke, tobacco use, and conversion disorder, non epileptiform spells. ? ?Prior to arrival, he was reportedly visited by a friend and found altered with vomit near the patient. EMS reports of empty med bottles of oxcarbazepine and atorvastatin.  ? ?The patient was admitted to the [facility] service. No acute changes on head CT. Poison control contacted in the ED. The patient developed agitation while in the ED. The were given 2.5 of haldol and 2 mg of ativan. They then developed on isolated episode of hypotension (60/41). 1 L of IVF was given. ? ?PCCM was consulted for evaluation of hypotension and agitation.  Overnight patient became agitated, was transferred to ICU for close monitoring and Precedex infusion ? ?Pertinent  Medical History  ?Etoh use disorder ?Mood disorder ?Substance abuse ?HFpEF ?Tobacco use  ? ?Significant Hospital Events: ?Including procedures, antibiotic start and stop dates in addition to other pertinent events   ?4/11 ED via EMS for AMS. Admit to FPTS with concern for Dts. PCCM consult for agitation + medication related hypotension  ?4/12 intubated ?4/16-extubated ? ? ?Interim History / Subjective:  ?No overnight events ?Weaned well this morning ?Agitation following being off the vent ?A-fib with RVR ? ?Objective   ?Blood pressure (!) 151/101, pulse (!) 125, temperature 98.8 ?F (37.1 ?C), temperature source Oral, resp. rate (!) 21, height 5\' 11"  (1.803 m), weight 110.5 kg, SpO2 97 %. ?   ?Vent  Mode: PSV;CPAP ?FiO2 (%):  [40 %] 40 % ?Set Rate:  [18 bmp] 18 bmp ?Vt Set:  [600 mL] 600 mL ?PEEP:  [5 cmH20] 5 cmH20 ?Pressure Support:  [5 cmH20] 5 cmH20 ?Plateau Pressure:  [16 cmH20-21 cmH20] 18 cmH20  ? ?Intake/Output Summary (Last 24 hours) at 11/02/2021 1123 ?Last data filed at 11/02/2021 0700 ?Gross per 24 hour  ?Intake 1860.98 ml  ?Output 3010 ml  ?Net -1149.02 ml  ? ?Filed Weights  ? 10/31/21 0320 11/01/21 0500 11/02/21 0500  ?Weight: 106.2 kg 110.8 kg 110.5 kg  ? ? ?Examination: ?  ?Physical exam: Middle-aged, agitated  ?General: Agitated, delirious ?HEENT: Moist oral mucosa  ?neuro: Not following commands, not easily redirectable ?Chest: Clear breath sounds, no rales ?Heart:  S1-S2 appreciated ?Abdomen: Soft, nontender, nondistended, bowel sounds present ?Skin: No rash ? ?Resolved Hospital Problem list   ?Medication related hypotension, resolved ? ?Assessment & Plan:  ? ?Acute hypoxic/hypercapnic respiratory failure ?Aspiration pneumonia ?-Extubated today ?-We will resume Precedex as patient is significantly agitated ?-Not safe to take orally ?-Continue Unasyn ? ?Metabolic encephalopathy with agitated delirium ?-He does have a history of EtOH abuse ?-He is beyond the window of alcohol withdrawal at present ?-No need for seizure medications ?-Continue thiamine, multivitamin ? ?Rhabdomyolysis ?-Continue to monitor BUN/creatinine ?-Obtain CK levels in a.m. ? ?Constipation, abdominal distention ?KUB reveals significant gaseous patterns ?-Was on low intermittent suction ?-Bowel motion overnight ?-Has rectal tube in place ? ?Morbid obesity ? ?Extubated today ?Continues to be very agitated ? ?Best Practice (right click and "Reselect all SmartList Selections" daily)  ? ?  Diet/type: NPO at present ?DVT prophylaxis: LMWH ?GI prophylaxis: N/A ?Lines: N/A ?Foley:  N/A ?Code Status:  full code ?Last date of multidisciplinary goals of care discussion [4/12: Patient's son Kathlene November was updated over the phone] ? ?Labs    ?CBC: ?Recent Labs  ?Lab 10/28/21 ?1016 10/28/21 ?1025 10/29/21 ?0230 10/29/21 ?1104 10/31/21 ?0158 10/31/21 ?1143 11/01/21 ?4496 11/02/21 ?0231  ?WBC 14.8*  --  12.5*  --  7.4  --  7.0 6.9  ?NEUTROABS 12.1*  --   --   --   --   --   --   --   ?HGB 15.2   < > 12.7* 11.2* 12.3* 11.6* 12.0* 12.1*  ?HCT 43.9   < > 36.6* 33.0* 35.8* 34.0* 35.3* 35.2*  ?MCV 96.9  --  96.1  --  97.3  --  98.3 98.6  ?PLT 235  --  215  --  165  --  167 180  ? < > = values in this interval not displayed.  ? ? ?Basic Metabolic Panel: ?Recent Labs  ?Lab 10/29/21 ?1811 10/30/21 ?7591 10/30/21 ?1511 10/31/21 ?0023 10/31/21 ?1143 10/31/21 ?1245 11/01/21 ?6384 11/02/21 ?0231  ?NA  --  141 139 134* 141 140 141 142  ?K  --  2.8* 3.3* 3.7 3.7 3.8 4.5 4.5  ?CL  --  108 108 108  --  109 112* 111  ?CO2  --  23 24 15*  --  24 23 24   ?GLUCOSE  --  120* 124* 79  --  104* 148* 108*  ?BUN  --  8 5* <5*  --  8 15 13   ?CREATININE  --  0.97 0.85 0.37*  --  0.76 0.89 0.77  ?CALCIUM  --  7.6* 7.6* 6.5*  --  7.2* 7.5* 7.9*  ?MG 1.6* 1.8  --  1.1*  --  2.1 2.0 2.0  ?PHOS 2.7 1.7*  --  1.2*  --  3.2 4.0  --   ? ?GFR: ?Estimated Creatinine Clearance: 122.6 mL/min (by C-G formula based on SCr of 0.77 mg/dL). ?Recent Labs  ?Lab 10/28/21 ?1016 10/29/21 ?0230 10/31/21 ?0158 10/31/21 ?11/02/21 10/31/21 ?1245 11/01/21 ?11/02/21 11/02/21 ?0231  ?WBC 14.8* 12.5* 7.4  --   --  7.0 6.9  ?LATICACIDVEN 1.7  --   --  1.4 1.3  --   --   ? ? ?Liver Function Tests: ?Recent Labs  ?Lab 10/28/21 ?1016 10/28/21 ?1724 10/29/21 ?0230  ?AST 24 22 29   ?ALT 23 21 22   ?ALKPHOS 96 84 74  ?BILITOT 1.5* 1.0 1.0  ?PROT 7.3 6.8 6.2*  ?ALBUMIN 4.1 3.7 3.5  ? ?No results for input(s): LIPASE, AMYLASE in the last 168 hours. ?Recent Labs  ?Lab 10/28/21 ?1016  ?AMMONIA 28  ? ? ?ABG ?   ?Component Value Date/Time  ? PHART 7.351 10/31/2021 1143  ? PCO2ART 42.8 10/31/2021 1143  ? PO2ART 82 (L) 10/31/2021 1143  ? HCO3 23.2 10/31/2021 1143  ? TCO2 24 10/31/2021 1143  ? ACIDBASEDEF 2.0 10/31/2021 1143  ? O2SAT 94  10/31/2021 1143  ?  ? ?Coagulation Profile: ?Recent Labs  ?Lab 10/29/21 ?0230  ?INR 2.1*  ? ? ?Cardiac Enzymes: ?Recent Labs  ?Lab 10/28/21 ?1724 10/29/21 ?1811 10/30/21 ?12/29/21 10/31/21 ?1245 11/01/21 ?11/01/21  ?CKTOTAL 235 1,587* 1,059* 487* 1,227*  ? ? ?HbA1C: ?Hgb A1c MFr Bld  ?Date/Time Value Ref Range Status  ?11/05/2019 05:52 AM 6.0 (H) 4.8 - 5.6 % Final  ?  Comment:  ?  (NOTE) ?Pre diabetes:  5.7%-6.4% ?Diabetes:              >6.4% ?Glycemic control for   <7.0% ?adults with diabetes ?  ? ? ?CBG: ?Recent Labs  ?Lab 11/01/21 ?1943 11/01/21 ?2325 11/02/21 ?[id number] 11/02/21 ?[id number] 11/02/21 ?1103  ?GLUCAP 101* 93 104* 102* 104*  ? ?The patient is critically ill with multiple organ systems failure and requires high complexity decision making for assessment and support, frequent evaluation and titration of therapies, application of advanced monitoring technologies and extensive interpretation of multiple databases. Critical Care Time devoted to patient care services described in this note independent of APP/resident time (if applicable)  is 30 minutes.  ? ?Virl DiamondAdewale Zsofia Prout MD ?[facility] ?Personal pager: See Loretha StaplerAmion ?If unanswered, please page ?CCM On-call: #[phone] ? ?

## 2021-11-02 NOTE — Procedures (Signed)
Extubation Procedure Note ? ?Patient Details:   ?Name: Nicholas Estes ?DOB: [birth year] ?MRN: [id number] ?  ?Airway Documentation:  ?  ?Vent end date: 11/02/21 Vent end time: 1015  ? ?Evaluation ? O2 sats: stable throughout ?Complications: No apparent complications ?Patient did tolerate procedure well. ?Bilateral Breath Sounds: Clear, Diminished ?  ?No ? ?Patient extubated to 4L [place]. Patient had a positive cuff leak prior to extubation. No stridor noted at this time. Patient not able to speak or follow commands at this time.  ? ?[place] ?11/02/2021, 10:22 AM ? ?

## 2021-11-02 NOTE — Progress Notes (Signed)
eLink Physician-Brief Progress Note ?Patient Name: Nicholas Estes ?DOB: Dec 16, 1960 ?MRN: [id number] ? ? ?Date of Service ? 11/02/2021  ?HPI/Events of Note ? Patient on Eliquis - Extubated today and OGT removed. Currently NPO. Creatinine = 0.77. Platelets = 180.  ?eICU Interventions ? Plan: ?D/C Eliquis. ?Lovenox 110 mg [place] now and Q 12 hours.   ? ? ? ?Intervention Category ?Major Interventions: Other: ? ?Nicholas Estes ?11/02/2021, 9:47 PM ?

## 2021-11-03 ENCOUNTER — Inpatient Hospital Stay (HOSPITAL_COMMUNITY)

## 2021-11-03 DIAGNOSIS — K567 Ileus, unspecified: Secondary | ICD-10-CM | POA: Diagnosis not present

## 2021-11-03 DIAGNOSIS — G9340 Encephalopathy, unspecified: Secondary | ICD-10-CM | POA: Diagnosis not present

## 2021-11-03 LAB — GLUCOSE, CAPILLARY
Glucose-Capillary: 100 mg/dL — ABNORMAL HIGH (ref 70–99)
Glucose-Capillary: 107 mg/dL — ABNORMAL HIGH (ref 70–99)
Glucose-Capillary: 105 mg/dL — ABNORMAL HIGH (ref 70–99)
Glucose-Capillary: 91 mg/dL (ref 70–99)
Glucose-Capillary: 84 mg/dL (ref 70–99)
Glucose-Capillary: 84 mg/dL (ref 70–99)

## 2021-11-03 LAB — CBC WITH DIFFERENTIAL/PLATELET
Abs Immature Granulocytes: 0.03 10*3/uL (ref 0.00–0.07)
Basophils Absolute: 0 10*3/uL (ref 0.0–0.1)
Basophils Relative: 0 %
Eosinophils Absolute: 0.3 10*3/uL (ref 0.0–0.5)
Eosinophils Relative: 3 %
HCT: 34.8 % — ABNORMAL LOW (ref 39.0–52.0)
Hemoglobin: 11.6 g/dL — ABNORMAL LOW (ref 13.0–17.0)
Immature Granulocytes: 0 %
Lymphocytes Relative: 24 %
Lymphs Abs: 2.1 10*3/uL (ref 0.7–4.0)
MCH: 33.4 pg (ref 26.0–34.0)
MCHC: 33.3 g/dL (ref 30.0–36.0)
MCV: 100.3 fL — ABNORMAL HIGH (ref 80.0–100.0)
Monocytes Absolute: 0.8 10*3/uL (ref 0.1–1.0)
Monocytes Relative: 9 %
Neutro Abs: 5.5 10*3/uL (ref 1.7–7.7)
Neutrophils Relative %: 64 %
Platelets: 203 10*3/uL (ref 150–400)
RBC: 3.47 MIL/uL — ABNORMAL LOW (ref 4.22–5.81)
RDW: 12.8 % (ref 11.5–15.5)
WBC: 8.7 10*3/uL (ref 4.0–10.5)
nRBC: 0 % (ref 0.0–0.2)

## 2021-11-03 LAB — BASIC METABOLIC PANEL
Anion gap: 7 (ref 5–15)
BUN: 11 mg/dL (ref 8–23)
CO2: 28 mmol/L (ref 22–32)
Calcium: 8.4 mg/dL — ABNORMAL LOW (ref 8.9–10.3)
Chloride: 108 mmol/L (ref 98–111)
Creatinine, Ser: 0.79 mg/dL (ref 0.61–1.24)
GFR, Estimated: >60 mL/min (ref >60)
Glucose, Bld: 104 mg/dL — ABNORMAL HIGH (ref 70–99)
Potassium: 4.6 mmol/L (ref 3.5–5.1)
Sodium: 143 mmol/L (ref 135–145)

## 2021-11-03 LAB — 10-HYDROXYCARBAZEPINE: Triliptal/MTB(Oxcarbazepin): 61 ug/mL — ABNORMAL HIGH (ref 10–35)

## 2021-11-03 LAB — MAGNESIUM: Magnesium: 2 mg/dL (ref 1.7–2.4)

## 2021-11-03 LAB — CK: Total CK: 500 U/L — ABNORMAL HIGH (ref 49–397)

## 2021-11-03 MED ORDER — OXCARBAZEPINE 300 MG PO TABS
300.0000 mg | ORAL_TABLET | Freq: Two times a day (BID) | ORAL | Status: DC
  Administered 2021-11-03 (×2): 300 mg via ORAL
  Filled 2021-11-03 (×3): qty 1

## 2021-11-03 MED ORDER — IOHEXOL 9 MG/ML PO SOLN
500.0000 mL | ORAL | Status: AC
  Administered 2021-11-03: 500 mL via ORAL

## 2021-11-03 MED ORDER — IOHEXOL 9 MG/ML PO SOLN
ORAL | Status: AC
  Administered 2021-11-03: 500 mL via ORAL
  Filled 2021-11-03: qty 1000

## 2021-11-03 MED ORDER — ALBUMIN HUMAN 25 % IV SOLN
12.5000 g | Freq: Once | INTRAVENOUS | Status: AC
  Administered 2021-11-03: 12.5 g via INTRAVENOUS
  Filled 2021-11-03: qty 50

## 2021-11-03 MED ORDER — BUPROPION HCL ER (XL) 150 MG PO TB24
150.0000 mg | ORAL_TABLET | Freq: Every day | ORAL | Status: DC
  Administered 2021-11-03: 150 mg via ORAL
  Filled 2021-11-03 (×2): qty 1

## 2021-11-03 MED ORDER — FUROSEMIDE 10 MG/ML IJ SOLN
40.0000 mg | Freq: Once | INTRAMUSCULAR | Status: AC
Start: 2021-11-03 — End: 2021-11-03
  Administered 2021-11-03: 40 mg via INTRAVENOUS
  Filled 2021-11-03: qty 4

## 2021-11-03 NOTE — Progress Notes (Signed)
eLink Physician-Brief Progress Note ?Patient Name: Nicholas Estes ?DOB: 04/09/1961 ?MRN: [id number] ? ? ?Date of Service ? 11/03/2021  ?HPI/Events of Note ? Nursing request for AM lab orders.   ?eICU Interventions ? Will order CBC with platelets, BMP and Mg++ level at 5 AM.   ? ? ? ?Intervention Category ?Major Interventions: Other: ? ?Nicholas Estes Dennard Nip ?11/03/2021, 4:15 AM ?

## 2021-11-03 NOTE — Progress Notes (Signed)
eLink Physician-Brief Progress Note ?Patient Name: Nicholas Estes ?DOB: 12/26/1960 ?MRN: [id number] ? ? ?Date of Service ? 11/03/2021  ?HPI/Events of Note ? Oliguria - No CVL or CVP. LVEF = 55-60%.  ?eICU Interventions ? Plan: ?25% Albumin 12.5 gm IV now.   ? ? ? ?Intervention Category ?Major Interventions: Other: ? ?Adreanna Fickel Cornelia Copa ?11/03/2021, 8:37 PM ?

## 2021-11-03 NOTE — Evaluation (Signed)
Clinical/Bedside Swallow Evaluation ?Patient Details  ?Name: Manvik Kozloff ?MRN: [id number] ?Date of Birth: 20-Jul-1961 ? ?Today's Date: 11/03/2021 ?Time: SLP Start Time (ACUTE ONLY): 1350 SLP Stop Time (ACUTE ONLY): [id number] ?SLP Time Calculation (min) (ACUTE ONLY): 15 min ? ?Past Medical History:  ?Past Medical History:  ?Diagnosis Date  ? Atrial fibrillation ([place])   ? Hypertension   ? Stroke (cerebrum) ([place]) 11/04/2019  ? ?Past Surgical History:  ?Past Surgical History:  ?Procedure Laterality Date  ? IR CT HEAD LTD  11/04/2019  ? IR CT HEAD LTD  11/04/2019  ? IR PERCUTANEOUS ART THROMBECTOMY/INFUSION INTRACRANIAL INC DIAG ANGIO  11/04/2019  ? IR US GUIDE VASC ACCESS RIGHT  11/04/2019  ? RADIOLOGY WITH ANESTHESIA N/A 11/04/2019  ? Procedure: IR WITH ANESTHESIA;  Surgeon: Luanne Bras, MD;  Location: [place];  Service: Radiology;  Laterality: N/A;  ? 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 Pt has a history of mild dysaphgia following CVA in 2021. MBS showed mild residue and impaired timing, no penetration or aspiration. DYs 3/thin recommended with supervision given AMS.  ?  ?Assessment / Plan / Recommendation  ?Clinical Impression ? Pt seen at bedside for clinical swallow evaluation. Upon SLP entry into the room, pt was observed to be producing nonsensical speech possibly related to acute changes in mentation. Oral motor exam unremarkable. Pt kept eyes closed for the duration of PO trials, but was stimulable for thin liquid and puree consistencies. Pt accepted ~8oz of thin liquid via straw cup. Pt exhibited overt s/s of aspiration including wet vocal quality and immediate congested cough. SLP facilitated 2 teaspoon bites of applesauce to the pt, and he was able to lateralize and propel bolus timely and efficiently. Shortly after applesauce trials, pt had delayed coughing in conjunction with  regurgitation of clear mucous. Due to pt's increased nausea with PO trials, further trials were not initiated. It is recommended that the pt's NPO status be maintained at this time. SLP will continue to follow patient for improved mentation, attention and interest in PO trials. Pt's RN reported that the current plan is for NG tube placement and SLP is in agreement with plan. ?SLP Visit Diagnosis: Dysphagia, unspecified (R13.10) ?   ?Aspiration Risk ? Moderate aspiration risk  ?  ?Diet Recommendation NPO  ? ?Medication Administration: Via alternative means ?Compensations: Minimize environmental distractions;Slow rate ?Postural Changes: Seated upright at 90 degrees  ?  ?Other  Recommendations Recommended Consults: Consider GI evaluation ?Oral Care Recommendations: Oral care QID   ? ?Recommendations for follow up therapy are one component of a multi-disciplinary discharge planning process, led by the attending physician.  Recommendations may be updated based on patient status, additional functional criteria and insurance authorization. ? ?Follow up Recommendations Other (comment) (TBD)  ? ? ?  ?Assistance Recommended at Discharge Other (comment) (TBD)  ?Functional Status Assessment Patient has had a recent decline in their functional status and demonstrates the ability to make significant improvements in function in a reasonable and predictable amount of time.  ?Frequency and Duration min 2x/week  ?2 weeks ?  ?   ? ?Prognosis Prognosis for Safe Diet Advancement: Good  ? ?  ? 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 Pt has a history of mild dysaphgia following CVA in 2021. MBS  showed mild residue and impaired timing, no penetration or aspiration. DYs 3/thin recommended with supervision given AMS. ?Type of Study: Bedside Swallow Evaluation ?Previous Swallow  Assessment: MBS 2021 (Placed on D3/Thin at d/c) ?Diet Prior to this Study: Dysphagia 3 (soft);Thin liquids ?Temperature Spikes Noted: No ?Respiratory Status: Room air ?History of Recent Intubation: Yes ?Length of Intubations (days): 4 days ?Date extubated: 11/02/21 ?Behavior/Cognition: Distractible;Requires cueing ?Oral Cavity Assessment: Dry ?Oral Care Completed by SLP: No ?Oral Cavity - Dentition: Adequate natural dentition ?Vision:  (Eyes closed during POs) ?Self-Feeding Abilities: Total assist ?Patient Positioning: Upright in bed ?Baseline Vocal Quality: Normal ?Volitional Cough: Wet;Congested  ?  ?Oral/Motor/Sensory Function Overall Oral Motor/Sensory Function: Within functional limits   ?Ice Chips Ice chips: Not tested   ?Thin Liquid Thin Liquid: Impaired ?Presentation: Straw ?Pharyngeal  Phase Impairments: Wet Vocal Quality;Throat Clearing - Delayed;Cough - Immediate  ?  ?Nectar Thick Nectar Thick Liquid: Not tested   ?Honey Thick Honey Thick Liquid: Not tested   ?Puree Puree: Within functional limits ?Presentation: Spoon   ?Solid ? ? ?  Solid: Not tested  ? ?  ? ?Vaughan Sine ?11/03/2021,2:13 PM ? ? ? ?

## 2021-11-03 NOTE — Progress Notes (Signed)
Wasted 225 ml Fentanyl with Vincent Gros, RN in stericycle bin.  ?

## 2021-11-03 NOTE — Progress Notes (Addendum)
? ?NAME:  Nicholas Estes, MRN:  [id number], DOB:  07-Apr-1961, LOS: 6 ?ADMISSION DATE:  10/28/2021, CONSULTATION DATE:  4/11 ?REFERRING MD:  FMTS , REASON FOR CONSULT: Agitation and hypotension  ? ?History of Present Illness:  ?Patient is encephalopathic and/or intubated. Therefore history has been obtained from chart review.  ? ?Nicholas Estes, is a 61 y.o. male, who presented to the [facility] ED with a chief complaint of Altered mental status ? ?They have a pertinent past medical history of ETOH abuse, afib, HTN, stroke, tobacco use, and conversion disorder, non epileptiform spells. ? ?Prior to arrival, he was reportedly visited by a friend and found altered with vomit near the patient. EMS reports of empty med bottles of oxcarbazepine and atorvastatin.  ? ?The patient was admitted to the [facility] service. No acute changes on head CT. Poison control contacted in the ED. The patient developed agitation while in the ED. The were given 2.5 of haldol and 2 mg of ativan. They then developed on isolated episode of hypotension (60/41). 1 L of IVF was given. ? ?PCCM was consulted for evaluation of hypotension and agitation.  Overnight patient became agitated, was transferred to ICU for close monitoring and Precedex infusion ? ?Pertinent  Medical History  ?Etoh use disorder ?Mood disorder ?Substance abuse ?HFpEF ?Tobacco use  ? ?Significant Hospital Events: ?Including procedures, antibiotic start and stop dates in addition to other pertinent events   ?4/11 ED via EMS for AMS. Admit to FPTS with concern for Dts. PCCM consult for agitation + medication related hypotension  ?4/12 intubated ?4/16-extubated, Eliquis stopped and Lovenox started ?Remains on Precedex 6/17, actively weaning , Pending CorTrack placement  ? ? ?Interim History / Subjective:  ?Extubated x 24 hours ?OG out so Eliquis converted to Lovenox until CorTrack can be placed ?WBC 8.7/ Mag 2/ HGB 11.6/PLTS 203, Afebrile ?Remains agitated off vent  ?Remains in a fib, rate  controlled at 87 4/17/ am  ?Na 143/ K 4.6/ Creatinine 0.79 ?Net + 11 L per Epic, No CXR since 4/14. 1+ LE edema ?Remains on 4 L [place] since extubation with sats of 99% ? ?Objective   ?Blood pressure (!) 159/93, pulse 72, temperature 97.7 ?F (36.5 ?C), temperature source Axillary, resp. rate 20, height 5\' 11"  (1.803 m), weight 109.9 kg, SpO2 100 %. ?   ?   ? ?Intake/Output Summary (Last 24 hours) at 11/03/2021 0835 ?Last data filed at 11/03/2021 0700 ?Gross per 24 hour  ?Intake 1094.25 ml  ?Output 1035 ml  ?Net 59.25 ml  ? ?Filed Weights  ? 11/01/21 0500 11/02/21 0500 11/03/21 0500  ?Weight: 110.8 kg 110.5 kg 109.9 kg  ? ? ?Examination: ?  ?Physical exam: Middle-aged, calm at present on Precedex at 0.35mcg/kg/hr ?General: Calm and sleeping at present  ?HEENT: MM pink and dry, PERRLA ?neuro: Not following commands, not easily redirectable, remains confused ?Chest: Bilateral chest excursion, few rhonchi, diminished per bases ?Heart:  S1-S2 appreciated, a fib per tele  ?Abdomen: Soft, nontender, distended, bowel sounds present, Body mass index is 33.79 kg/m?Marland Kitchen  ?Skin: No rash No lesions, Clean dry and intact ? ?Resolved Hospital Problem list   ?Medication related hypotension, resolved ? ?Assessment & Plan:  ? ?Acute hypoxic/hypercapnic respiratory failure ?Aspiration pneumonia ?- Extubated 4/16 ?- Remains on Precedex, but actively weaning ?- Not safe to take po's >> Order for Cor Track placed 4/17  for meds and nutrition  ?- Unasyn dosing completed.  ?- CXR today and prn  ?- Wean nasal oxygen for sats >  [id number] ? ?Metabolic encephalopathy with agitated delirium ?-He does have a history of ETOH abuse ?-He is beyond the window of alcohol withdrawal at present ?- Wean Precedex ?- Start Seroquel per Cor Track tube once placed ?- No need for seizure medications ?- Continue thiamine, multivitamin ?- Cor Track placement today for meds and nutrition  ? ?Rhabdomyolysis ?-Continue to monitor BUN/creatinine ?-Obtain CK  level as add on 4/17,  and in am if continues to up trend ? ?Constipation, abdominal distention ?KUB reveals significant gaseous patterns ?-Was on low intermittent suction ?-Bowel motion overnight ?-Has rectal tube in place ?- Bowel Regimen  ? ?Morbid obesity ?- Consider Referral To Healthy weight and wellness  ? ?Critical Care Medicine Time : 35 minutes ? ?Plan 4/17 is to wean and discontinue Precedex, Start Seroquel once Cor Track is placed , Wean nasal oxygen as able , CXR to assess volume status . ?Will give Lasix if CXR looks wet, renal function 0.79 ?Remains confused.  ? ?Best Practice (right click and "Reselect all SmartList Selections" daily)  ? ?Diet/type: NPO at present, pending Cor Track placement  ?DVT prophylaxis: LMWH ?GI prophylaxis: N/A ?Lines: N/A ?Foley:  N/A ?Code Status:  full code ?Last date of multidisciplinary goals of care discussion [4/12: Pt. To DC to  ?Labs   ?CBC: ?Recent Labs  ?Lab 10/28/21 ?1016 10/28/21 ?1025 10/29/21 ?0230 10/29/21 ?1104 10/31/21 ?0158 10/31/21 ?1143 11/01/21 ?[id number] 11/02/21 ?0231 11/03/21 ?0654  ?WBC 14.8*  --  12.5*  --  7.4  --  7.0 6.9 8.7  ?NEUTROABS 12.1*  --   --   --   --   --   --   --  5.5  ?HGB 15.2   < > 12.7*   < > 12.3* 11.6* 12.0* 12.1* 11.6*  ?HCT 43.9   < > 36.6*   < > 35.8* 34.0* 35.3* 35.2* 34.8*  ?MCV 96.9  --  96.1  --  97.3  --  98.3 98.6 100.3*  ?PLT 235  --  215  --  165  --  167 180 203  ? < > = values in this interval not displayed.  ? ? ?Basic Metabolic Panel: ?Recent Labs  ?Lab 10/29/21 ?1811 10/30/21 ?[id number] 10/30/21 ?1511 10/31/21 ?0023 10/31/21 ?1143 10/31/21 ?1245 11/01/21 ?[id number] 11/02/21 ?0231 11/03/21 ?0654  ?NA  --  141   < > 134* 141 140 141 142 143  ?K  --  2.8*   < > 3.7 3.7 3.8 4.5 4.5 4.6  ?CL  --  108   < > 108  --  109 112* 111 108  ?CO2  --  23   < > 15*  --  24 23 24 28   ?GLUCOSE  --  120*   < > 79  --  104* 148* 108* 104*  ?BUN  --  8   < > <5*  --  8 15 13 11   ?CREATININE  --  0.97   < > 0.37*  --  0.76 0.89 0.77 0.79  ?CALCIUM  --  7.6*   < > 6.5*   --  7.2* 7.5* 7.9* 8.4*  ?MG 1.6* 1.8  --  1.1*  --  2.1 2.0 2.0 2.0  ?PHOS 2.7 1.7*  --  1.2*  --  3.2 4.0  --   --   ? < > = values in this interval not displayed.  ? ?GFR: ?Estimated Creatinine Clearance: 122.2 mL/min (by C-G formula based on SCr of 0.79 mg/dL). ?Recent Labs  ?  Lab 10/28/21 ?1016 10/29/21 ?0230 10/31/21 ?0158 10/31/21 ?[id number] 10/31/21 ?1245 11/01/21 ?[id number] 11/02/21 ?0231 11/03/21 ?0654  ?WBC 14.8*   < > 7.4  --   --  7.0 6.9 8.7  ?LATICACIDVEN 1.7  --   --  1.4 1.3  --   --   --   ? < > = values in this interval not displayed.  ? ? ?Liver Function Tests: ?Recent Labs  ?Lab 10/28/21 ?1016 10/28/21 ?1724 10/29/21 ?0230  ?AST 24 22 29   ?ALT 23 21 22   ?ALKPHOS 96 84 74  ?BILITOT 1.5* 1.0 1.0  ?PROT 7.3 6.8 6.2*  ?ALBUMIN 4.1 3.7 3.5  ? ?No results for input(s): LIPASE, AMYLASE in the last 168 hours. ?Recent Labs  ?Lab 10/28/21 ?1016  ?AMMONIA 28  ? ? ?ABG ?   ?Component Value Date/Time  ? PHART 7.351 10/31/2021 1143  ? PCO2ART 42.8 10/31/2021 1143  ? PO2ART 82 (L) 10/31/2021 1143  ? HCO3 23.2 10/31/2021 1143  ? TCO2 24 10/31/2021 1143  ? ACIDBASEDEF 2.0 10/31/2021 1143  ? O2SAT 94 10/31/2021 1143  ?  ? ?Coagulation Profile: ?Recent Labs  ?Lab 10/29/21 ?0230  ?INR 2.1*  ? ? ?Cardiac Enzymes: ?Recent Labs  ?Lab 10/28/21 ?1724 10/29/21 ?1811 10/30/21 ?[id number] 10/31/21 ?1245 11/01/21 ?0941  ?CKTOTAL 235 1,587* 1,059* 487* 1,227*  ? ? ?HbA1C: ?Hgb A1c MFr Bld  ?Date/Time Value Ref Range Status  ?11/05/2019 05:52 AM 6.0 (H) 4.8 - 5.6 % Final  ?  Comment:  ?  (NOTE) ?Pre diabetes:          5.7%-6.4% ?Diabetes:              >6.4% ?Glycemic control for   <7.0% ?adults with diabetes ?  ? ? ?CBG: ?Recent Labs  ?Lab 11/02/21 ?1518 11/02/21 ?1938 11/02/21 ?2330 11/03/21 ?0330 11/03/21 ?0813  ?GLUCAP 109* 107* 95 100* 107*  ? ?The patient is critically ill with multiple organ systems failure and requires high complexity decision making for assessment and support, frequent evaluation and titration of therapies,  application of advanced monitoring technologies and extensive interpretation of multiple databases. Critical Care Time devoted to patient care services described in this note independent of APP/resident time (if a

## 2021-11-03 NOTE — TOC CAGE-AID Note (Signed)
Transition of Care (TOC) - CAGE-AID Screening ? ? ?Patient Details  ?Name: Nicholas Estes ?MRN: [id number] ?Date of Birth: [birth year] ? ?Transition of Care (TOC) CM/SW Contact:    ?Catalina Pizza Lunna Vogelgesang, LCSWA ?Phone Number: ?11/03/2021, 12:26 PM ? ? ?Clinical Narrative: ?Patient remains agitated and only yelling out "Help Me!"  Cage Aid unable to be completed at this time. ? ? ?CAGE-AID Screening: ?Substance Abuse Screening unable to be completed due to: : Patient unable to participate ? ?  ?  ?  ?  ?  ? ?  ? ?  ? ? ? ? ? ? ?

## 2021-11-04 DIAGNOSIS — G9340 Encephalopathy, unspecified: Secondary | ICD-10-CM | POA: Diagnosis not present

## 2021-11-04 DIAGNOSIS — K567 Ileus, unspecified: Secondary | ICD-10-CM | POA: Diagnosis not present

## 2021-11-04 LAB — CBC WITH DIFFERENTIAL/PLATELET
Abs Immature Granulocytes: 0.03 10*3/uL (ref 0.00–0.07)
Basophils Absolute: 0 10*3/uL (ref 0.0–0.1)
Basophils Relative: 0 %
Eosinophils Absolute: 0.3 10*3/uL (ref 0.0–0.5)
Eosinophils Relative: 3 %
HCT: 37.7 % — ABNORMAL LOW (ref 39.0–52.0)
Hemoglobin: 13 g/dL (ref 13.0–17.0)
Immature Granulocytes: 0 %
Lymphocytes Relative: 23 %
Lymphs Abs: 1.8 10*3/uL (ref 0.7–4.0)
MCH: 33.2 pg (ref 26.0–34.0)
MCHC: 34.5 g/dL (ref 30.0–36.0)
MCV: 96.4 fL (ref 80.0–100.0)
Monocytes Absolute: 0.7 10*3/uL (ref 0.1–1.0)
Monocytes Relative: 9 %
Neutro Abs: 5 10*3/uL (ref 1.7–7.7)
Neutrophils Relative %: 65 %
Platelets: 229 10*3/uL (ref 150–400)
RBC: 3.91 MIL/uL — ABNORMAL LOW (ref 4.22–5.81)
RDW: 12.3 % (ref 11.5–15.5)
WBC: 7.9 10*3/uL (ref 4.0–10.5)
nRBC: 0 % (ref 0.0–0.2)

## 2021-11-04 LAB — BASIC METABOLIC PANEL
Anion gap: 11 (ref 5–15)
BUN: 8 mg/dL (ref 8–23)
CO2: 23 mmol/L (ref 22–32)
Calcium: 8.2 mg/dL — ABNORMAL LOW (ref 8.9–10.3)
Chloride: 103 mmol/L (ref 98–111)
Creatinine, Ser: 0.86 mg/dL (ref 0.61–1.24)
GFR, Estimated: >60 mL/min (ref >60)
Glucose, Bld: 90 mg/dL (ref 70–99)
Potassium: 3.1 mmol/L — ABNORMAL LOW (ref 3.5–5.1)
Sodium: 137 mmol/L (ref 135–145)

## 2021-11-04 LAB — GLUCOSE, CAPILLARY
Glucose-Capillary: 103 mg/dL — ABNORMAL HIGH (ref 70–99)
Glucose-Capillary: 104 mg/dL — ABNORMAL HIGH (ref 70–99)
Glucose-Capillary: 107 mg/dL — ABNORMAL HIGH (ref 70–99)
Glucose-Capillary: 108 mg/dL — ABNORMAL HIGH (ref 70–99)
Glucose-Capillary: 91 mg/dL (ref 70–99)
Glucose-Capillary: 92 mg/dL (ref 70–99)

## 2021-11-04 LAB — CK: Total CK: 303 U/L (ref 49–397)

## 2021-11-04 LAB — MAGNESIUM: Magnesium: 1.9 mg/dL (ref 1.7–2.4)

## 2021-11-04 MED ORDER — OXCARBAZEPINE 300 MG PO TABS
300.0000 mg | ORAL_TABLET | Freq: Two times a day (BID) | ORAL | Status: DC
  Administered 2021-11-04 – 2021-11-05 (×3): 300 mg
  Filled 2021-11-04 (×4): qty 1

## 2021-11-04 MED ORDER — MAGNESIUM SULFATE 2 GM/50ML IV SOLN
2.0000 g | Freq: Once | INTRAVENOUS | Status: AC
  Administered 2021-11-04: 2 g via INTRAVENOUS
  Filled 2021-11-04: qty 50

## 2021-11-04 MED ORDER — POTASSIUM CHLORIDE 10 MEQ/100ML IV SOLN
10.0000 meq | INTRAVENOUS | Status: AC
  Administered 2021-11-04 (×6): 10 meq via INTRAVENOUS
  Filled 2021-11-04 (×6): qty 100

## 2021-11-04 MED ORDER — HYDRALAZINE HCL 20 MG/ML IJ SOLN
10.0000 mg | INTRAMUSCULAR | Status: DC | PRN
Start: 2021-11-04 — End: 2021-11-05
  Administered 2021-11-05: 10 mg via INTRAVENOUS
  Filled 2021-11-04: qty 1

## 2021-11-04 MED ORDER — VITAL 1.5 CAL PO LIQD
1000.0000 mL | ORAL | Status: DC
  Administered 2021-11-04: 1000 mL

## 2021-11-04 MED ORDER — BETHANECHOL CHLORIDE 10 MG PO TABS
10.0000 mg | ORAL_TABLET | Freq: Three times a day (TID) | ORAL | Status: DC
  Administered 2021-11-04 – 2021-11-05 (×4): 10 mg
  Filled 2021-11-04 (×6): qty 1

## 2021-11-04 MED ORDER — BUPROPION HCL 75 MG PO TABS
75.0000 mg | ORAL_TABLET | Freq: Two times a day (BID) | ORAL | Status: DC
  Administered 2021-11-04 – 2021-11-05 (×3): 75 mg
  Filled 2021-11-04 (×3): qty 1

## 2021-11-04 MED ORDER — ONDANSETRON HCL 4 MG/2ML IJ SOLN
4.0000 mg | Freq: Once | INTRAMUSCULAR | Status: AC
  Administered 2021-11-04: 4 mg via INTRAVENOUS
  Filled 2021-11-04: qty 2

## 2021-11-04 MED ORDER — VITAL HIGH PROTEIN PO LIQD: 1000.0000 mL | ORAL | Status: DC

## 2021-11-04 MED ORDER — FUROSEMIDE 10 MG/ML IJ SOLN
40.0000 mg | Freq: Once | INTRAMUSCULAR | Status: AC
  Administered 2021-11-04: 40 mg via INTRAVENOUS
  Filled 2021-11-04: qty 4

## 2021-11-04 NOTE — Progress Notes (Signed)
Nutrition Follow-up ? ?DOCUMENTATION CODES:  ? ?Not applicable ? ?INTERVENTION:  ? ?Initiate tube feeds via NG tube: ?- Vital 1.5 @ 20 ml/hr (480 ml/day) ? ?Tube feeding regimen provides 720 kcal, 32 grams of protein, and 367 ml of H2O (meets 33% of minimum kcal needs and 29% of minimum protein needs). ? ? ?RD will monitor for ability to advance tube feeds to goal: ?- Vital 1.5 @ 65 ml/hr (1560 ml/day) ?- ProSource TF 45 ml daily ? ?Recommended tube feeding regimen at goal rate would provide 2380 kcal, 116 grams of protein, and 1192 ml of H2O. ? ?- RD will monitor for diet advancement and adjust TF regimen as appropriate ? ?NUTRITION DIAGNOSIS:  ? ?Inadequate oral intake related to inability to eat as evidenced by NPO status. ? ?Ongoing, being addressed via initiation of trickle tube feeds ? ?GOAL:  ? ?Patient will meet greater than or equal to 90% of their needs ? ?Unmet at this time, pt only receiving trickle tube feeds ? ?MONITOR:  ? ?Vent status, Labs, Weight trends, TF tolerance, Skin ? ?REASON FOR ASSESSMENT:  ? ?Consult ?Enteral/tube feeding initiation and management (trickle tube feeds) ? ?ASSESSMENT:  ? ?61 year old male who presented to the ED on 4/11 with AMS after medication overdose. 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 Pt admitted with acute encephalopathy. ? ?04/12 - intubated ?04/16 - extubated ?04/17 - CT abdomen/pelvis consistent with ileus. NG tube placed for suction ? ?Discussed pt with RN and during ICU rounds. Pt with ileus, abdomen distended. NG tube remains in place. Consult received for initiation of trickle tube feeds. ? ?Pt is altered today per RN, requesting ice chips. RN started tube feeds at 20 ml/hr and pt reported nausea so rate decreased to 10 ml/hr. Pt working with SLP towards diet advancement. Per SLP note, pt is progressing and will likely advance to oral intake tomorrow if he  tolerates tube feeds and water today. ? ?RD will leave recommendations for goal tube feeding regimen. ? ?Admit weight: 107.3 kg ?Current weight: 109.4 kg ? ?Medications reviewed and include: colace, folic acid, IV lasix 40 mg once, MVI with minerals daily, protonix, miralax, senna, thiamine, amiodarone drip, IV KCl 10 mEq x 6 runs ? ?Labs reviewed: potassium 3.1 ?CBG's: 84-105 x 24 hours ? ?UOP: 3120 ml x 24 hours ?NGT: 415 ml x 24 hours ?Stool: 100 ml x 24 hours via rectal tube ?I/O's: +9.2 L since admit ? ?Diet Order:   ?Diet Order   ? ?       ?  Diet NPO time specified Except for: Ice Chips, Other (See Comments)  Diet effective now       ?  ? ?  ?  ? ?  ? ? ?EDUCATION NEEDS:  ? ?No education needs have been identified at this time ? ?Skin:  Skin Assessment: ?Skin Integrity Issues: ?Stage I: coccyx ? ?Last BM:  11/04/21 rectal tube ? ?Height:  ? ?Ht Readings from Last 1 Encounters:  ?10/29/21 5\' 11"  (1.803 m)  ? ? ?Weight:  ? ?Wt Readings from Last 1 Encounters:  ?11/04/21 109.4 kg  ? ? ?Ideal Body Weight:  78.2 kg ? ?BMI:  Body mass index is 33.64 kg/m?. ? ?Estimated Nutritional Needs:  ? ?Kcal:  2200-2400 ? ?Protein:  110-130 grams ? ?Fluid:  >/= 2.0 L ? ? ? ?Gustavus Bryant, MS, RD, LDN ?Inpatient Clinical Dietitian ?Please see AMiON for contact information. ? ?

## 2021-11-04 NOTE — Progress Notes (Addendum)
eLink Physician-Brief Progress Note ?Patient Name: Nicholas Estes ?DOB: Dec 17, 1960 ?MRN: [id number] ? ? ?Date of Service ? 11/04/2021  ?HPI/Events of Note ? Patient is nauseated. He is in atrial fibrillation, so a reliable Qtc is not available.  Blood pressure is also elevated.  ?eICU Interventions ? Zofran 4 mg iv x 1 ordered. PRN iv Hydralazine ordered for high blood pressure.  ? ? ? ?  ? ?Migdalia Dk ?11/04/2021, 11:06 PM ?

## 2021-11-04 NOTE — Progress Notes (Signed)
? ?NAME:  Nicholas Estes, MRN:  [id number], DOB:  1961/03/26, LOS: 7 ?ADMISSION DATE:  10/28/2021, CONSULTATION DATE:  4/11 ?REFERRING MD:  FMTS , REASON FOR CONSULT: Agitation and hypotension  ? ?History of Present Illness:  ? ?Nicholas Estes, is a 61 y.o. male, who presented to the [facility] ED with a chief complaint of Altered mental status ? ?They have a pertinent past medical history of ETOH abuse, afib, HTN, stroke, tobacco use, and conversion disorder, non epileptiform spells. ? ?Prior to arrival, he was reportedly visited by a friend and found altered with vomit near the patient. EMS reports of empty med bottles of oxcarbazepine and atorvastatin.  ? ?The patient was admitted to the [facility] service. No acute changes on head CT. Poison control contacted in the ED. The patient developed agitation while in the ED. The were given 2.5 of haldol and 2 mg of ativan. They then developed on isolated episode of hypotension (60/41). 1 L of IVF was given. ? ?PCCM was consulted for evaluation of hypotension and agitation.  Overnight patient became agitated, was transferred to ICU for close monitoring and Precedex infusion ? ?Pertinent  Medical History  ?Etoh use disorder ?Mood disorder ?Substance abuse ?HFpEF ?Tobacco use  ? ?Significant Hospital Events: ?Including procedures, antibiotic start and stop dates in addition to other pertinent events   ?4/11 ED via EMS for AMS. Admit to FPTS with concern for Dts. PCCM consult for agitation + medication related hypotension  ?4/12 intubated ?4/16-extubated, Eliquis stopped and Lovenox started ?MRI -remote right occipital infarct , EEG negative for seizures ? ? ?Interim History / Subjective:  ?Failed swallow evaluation ?NG tube reinserted ?Precedex tapered to off overnight. ?Remains in restraints ?Good urine output with Lasix ? ?Objective   ?Blood pressure (!) 165/90, pulse (!) 114, temperature 97.8 ?F (36.6 ?C), temperature source Oral, resp. rate (!) 23, height 5\' 11"  (1.803 m), weight  109.4 kg, SpO2 95 %. ?   ?   ? ?Intake/Output Summary (Last 24 hours) at 11/04/2021 0856 ?Last data filed at 11/04/2021 0853 ?Gross per 24 hour  ?Intake 1318.93 ml  ?Output 3600 ml  ?Net -2281.07 ml  ? ? ?Filed Weights  ? 11/02/21 0500 11/03/21 0500 11/04/21 0500  ?Weight: 110.5 kg 109.9 kg 109.4 kg  ? ? ?Examination: ?  ?General: Middle-aged man, no distress, in restraints ?HEENT: MM pink and dry, PERRLA ?neuro: Alert oriented x3 , knew the year was 2023, place is: Hospital, nonfocal ?Chest: Decreased breath sounds bilateral, no accessory muscle use ?Heart: S1-S2 regular ?Abdomen: Soft, nontender, distended, bowel sounds present, ?Skin: No rash No lesions, Clean dry and intact ? ? ?Labs show hypokalemia, no leukocytosis ? ?Resolved Hospital Problem list   ?Medication related hypotension ?Acute hypoxic/hypercapnic respiratory failure - Extubated 4/16 ?Aspiration pneumonia -- Unasyn dosing completed.  ? ?Assessment & Plan:  ? ?Acute Metabolic encephalopathy with agitated delirium ?-Does not appear to be EtOH withdrawal, reports drinking 3-4 times per week ?-Weaned off Precedex ?-Resumed bupropion, Trileptal ?- Continue thiamine, multivitamin ? ?Ileus -CT abdomen confirms no bowel obstruction ?-He has loose stools, Flexi-Seal ?-Okay to use NG tube for for tube feeds ?-Replete hypokalemia aggressively ? ?Paroxysmal atrial fibrillation -on amiodarone and heparin ?Transition to oral when able to take ? ?Dysphagia postextubation -transition to orals when he clears swallow ?Restraints being used since he is likely to pull the NG tube ? ? ? ?Best Practice (right click and "Reselect all SmartList Selections" daily)  ? ?Diet/type: NPO  ?DVT prophylaxis: systemic heparin ?GI  prophylaxis: N/A ?Lines: N/A ?Foley:  N/A ?Code Status:  full code ?Last date of multidisciplinary goals of care discussion 4/12 ? ?Labs   ?CBC: ?Recent Labs  ?Lab 10/28/21 ?1016 10/28/21 ?1025 10/31/21 ?0158 10/31/21 ?1143 11/01/21 ?[id number] 11/02/21 ?0231  11/03/21 ?[id number] 11/04/21 ?[id number]  ?WBC 14.8*   < > 7.4  --  7.0 6.9 8.7 7.9  ?NEUTROABS 12.1*  --   --   --   --   --  5.5 5.0  ?HGB 15.2   < > 12.3* 11.6* 12.0* 12.1* 11.6* 13.0  ?HCT 43.9   < > 35.8* 34.0* 35.3* 35.2* 34.8* 37.7*  ?MCV 96.9   < > 97.3  --  98.3 98.6 100.3* 96.4  ?PLT 235   < > 165  --  167 180 203 229  ? < > = values in this interval not displayed.  ? ? ? ?Basic Metabolic Panel: ?Recent Labs  ?Lab 10/29/21 ?1811 10/30/21 ?[id number] 10/30/21 ?1511 10/31/21 ?0023 10/31/21 ?1143 10/31/21 ?1245 11/01/21 ?[id number] 11/02/21 ?0231 11/03/21 ?[id number] 11/04/21 ?[id number]  ?NA  --  141   < > 134*   < > 140 141 142 143 137  ?K  --  2.8*   < > 3.7   < > 3.8 4.5 4.5 4.6 3.1*  ?CL  --  108   < > 108  --  109 112* 111 108 103  ?CO2  --  23   < > 15*  --  24 23 24 28 23   ?GLUCOSE  --  120*   < > 79  --  104* 148* 108* 104* 90  ?BUN  --  8   < > <5*  --  8 15 13 11 8   ?CREATININE  --  0.97   < > 0.37*  --  0.76 0.89 0.77 0.79 0.86  ?CALCIUM  --  7.6*   < > 6.5*  --  7.2* 7.5* 7.9* 8.4* 8.2*  ?MG 1.6* 1.8  --  1.1*  --  2.1 2.0 2.0 2.0 1.9  ?PHOS 2.7 1.7*  --  1.2*  --  3.2 4.0  --   --   --   ? < > = values in this interval not displayed.  ? ? ?GFR: ?Estimated Creatinine Clearance: 113.4 mL/min (by C-G formula based on SCr of 0.86 mg/dL). ?Recent Labs  ?Lab 10/28/21 ?1016 10/29/21 ?0230 10/31/21 ?[id number] 10/31/21 ?1245 11/01/21 ?[id number] 11/02/21 ?0231 11/03/21 ?[id number] 11/04/21 ?[id number]  ?WBC 14.8*   < >  --   --  7.0 6.9 8.7 7.9  ?LATICACIDVEN 1.7  --  1.4 1.3  --   --   --   --   ? < > = values in this interval not displayed.  ? ? ? ?Liver Function Tests: ?Recent Labs  ?Lab 10/28/21 ?1016 10/28/21 ?1724 10/29/21 ?0230  ?AST 24 22 29   ?ALT 23 21 22   ?ALKPHOS 96 84 74  ?BILITOT 1.5* 1.0 1.0  ?PROT 7.3 6.8 6.2*  ?ALBUMIN 4.1 3.7 3.5  ? ? ?No results for input(s): LIPASE, AMYLASE in the last 168 hours. ?Recent Labs  ?Lab 10/28/21 ?1016  ?AMMONIA 28  ? ? ? ?ABG ?   ?Component Value Date/Time  ? PHART 7.351 10/31/2021 1143  ? PCO2ART 42.8 10/31/2021  1143  ? PO2ART 82 (L) 10/31/2021 1143  ? HCO3 23.2 10/31/2021 1143  ? TCO2 24 10/31/2021 1143  ? ACIDBASEDEF 2.0 10/31/2021 1143  ? O2SAT 94 10/31/2021 1143  ? ?  ? ?Coagulation  Profile: ?Recent Labs  ?Lab 10/29/21 ?0230  ?INR 2.1*  ? ? ? ?Cardiac Enzymes: ?Recent Labs  ?Lab 10/30/21 ?[id number] 10/31/21 ?1245 11/01/21 ?[id number] 11/03/21 ?0654 11/04/21 ?[id number]  ?CKTOTAL 1,059* 487* 1,227* 500* 303  ? ? ? ?HbA1C: ?Hgb A1c MFr Bld  ?Date/Time Value Ref Range Status  ?11/05/2019 05:52 AM 6.0 (H) 4.8 - 5.6 % Final  ?  Comment:  ?  (NOTE) ?Pre diabetes:          5.7%-6.4% ?Diabetes:              >6.4% ?Glycemic control for   <7.0% ?adults with diabetes ?  ? ? ?CBG: ?Recent Labs  ?Lab 11/03/21 ?1606 11/03/21 ?1940 11/03/21 ?2321 11/04/21 ?[id number] 11/04/21 ?[id number]  ?GLUCAP 91 84 84 92 103*  ? ? ?Kara Mead MD. Shade Flood. ? Pulmonary & Critical care ?Pager : [phone] ? ?If no response to pager , please call [phone] until 7 pm ?After 7:00 pm call Elink  [phone]  ? ? ? ?11/04/2021 ?8:56 AM ? ? ?

## 2021-11-04 NOTE — Progress Notes (Signed)
[facility] ADULT ICU REPLACEMENT PROTOCOL ? ? ?The patient does apply for the [facility] Adult ICU Electrolyte Replacment Protocol based on the criteria listed below:  ? ?1.Exclusion criteria: TCTS patients, ECMO patients, and Dialysis patients ?2. Is GFR >/= 30 ml/min? Yes.    ?Patient's GFR today is >60 ?3. Is SCr </= 2? Yes.   ?Patient's SCr is 0.86 mg/dL ?4. Did SCr increase >/= 0.5 in 24 hours? No. ?5.Pt's weight >40kg  Yes.   ?6. Abnormal electrolyte(s): K+ 3.1, Mag 1.9  ?7. Electrolytes replaced per protocol ?8.  Call MD STAT for K+ </= 2.5, Phos </= 1, or Mag </= 1 ?Physician:  Arsenio Loader ? ?Genelle Bal 11/04/2021 4:52 AM  ?

## 2021-11-04 NOTE — Progress Notes (Signed)
Speech Language Pathology Treatment: Dysphagia  ?Patient Details ?Name: Nicholas Estes ?MRN: [id number] ?DOB: 09/12/1960 ?Today's Date: 11/04/2021 ?Time: 1000-1020 ?SLP Time Calculation (min) (ACUTE ONLY): 20 min ? ?Assessment / Plan / Recommendation ?Clinical Impression ? Pt demonstrates improved mentation in comparison with yesterday. He is attentive, alert, follows commands, generally oriented to date, but disoriented to situation. Pt has a large bore NG tube, receiving tube feedings with an ileus identified. Suction did not retrieve gastric contents. Pt able to consume ice and thin water without signs of aspiration. He is progressing nicely with swallowing and will likely be able to advance to oral intake tomorrow if he tolerates tube feeding and water today. Will f/u.   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 Pt has a history of mild dysaphgia following CVA in 2021. MBS showed mild residue and impaired timing, no penetration or aspiration. DYs 3/thin recommended with supervision given AMS. ?  ?   ?SLP Plan ? Continue with current plan of care ? ?  ?  ?Recommendations for follow up therapy are one component of a multi-disciplinary discharge planning process, led by the attending physician.  Recommendations may be updated based on patient status, additional functional criteria and insurance authorization. ?  ? ?Recommendations  ?Diet recommendations: Thin liquid  ?   ?    ?   ? ? ? ? Follow Up Recommendations: No SLP follow up ?Plan: Continue with current plan of care ? ? ? ? ?  ?  ? ? ?Zamiya Dillard, Riley Nearing ? ?11/04/2021, 10:52 AM ?

## 2021-11-05 ENCOUNTER — Inpatient Hospital Stay (HOSPITAL_COMMUNITY)

## 2021-11-05 DIAGNOSIS — R609 Edema, unspecified: Secondary | ICD-10-CM

## 2021-11-05 DIAGNOSIS — K567 Ileus, unspecified: Secondary | ICD-10-CM | POA: Diagnosis not present

## 2021-11-05 DIAGNOSIS — M7989 Other specified soft tissue disorders: Secondary | ICD-10-CM | POA: Diagnosis not present

## 2021-11-05 DIAGNOSIS — G9340 Encephalopathy, unspecified: Secondary | ICD-10-CM | POA: Diagnosis not present

## 2021-11-05 LAB — BASIC METABOLIC PANEL
Anion gap: 12 (ref 5–15)
BUN: 5 mg/dL — ABNORMAL LOW (ref 8–23)
CO2: 25 mmol/L (ref 22–32)
Calcium: 8.1 mg/dL — ABNORMAL LOW (ref 8.9–10.3)
Chloride: 99 mmol/L (ref 98–111)
Creatinine, Ser: 0.73 mg/dL (ref 0.61–1.24)
GFR, Estimated: >60 mL/min (ref >60)
Glucose, Bld: 101 mg/dL — ABNORMAL HIGH (ref 70–99)
Potassium: 3 mmol/L — ABNORMAL LOW (ref 3.5–5.1)
Sodium: 136 mmol/L (ref 135–145)

## 2021-11-05 LAB — GLUCOSE, CAPILLARY
Glucose-Capillary: 105 mg/dL — ABNORMAL HIGH (ref 70–99)
Glucose-Capillary: 92 mg/dL (ref 70–99)
Glucose-Capillary: 107 mg/dL — ABNORMAL HIGH (ref 70–99)
Glucose-Capillary: 84 mg/dL (ref 70–99)
Glucose-Capillary: 67 mg/dL — ABNORMAL LOW (ref 70–99)
Glucose-Capillary: 146 mg/dL — ABNORMAL HIGH (ref 70–99)

## 2021-11-05 LAB — CBC WITH DIFFERENTIAL/PLATELET
Abs Immature Granulocytes: 0.02 10*3/uL (ref 0.00–0.07)
Basophils Absolute: 0 10*3/uL (ref 0.0–0.1)
Basophils Relative: 0 %
Eosinophils Absolute: 0.2 10*3/uL (ref 0.0–0.5)
Eosinophils Relative: 3 %
HCT: 34.7 % — ABNORMAL LOW (ref 39.0–52.0)
Hemoglobin: 12.2 g/dL — ABNORMAL LOW (ref 13.0–17.0)
Immature Granulocytes: 0 %
Lymphocytes Relative: 21 %
Lymphs Abs: 1.6 10*3/uL (ref 0.7–4.0)
MCH: 33.1 pg (ref 26.0–34.0)
MCHC: 35.2 g/dL (ref 30.0–36.0)
MCV: 94 fL (ref 80.0–100.0)
Monocytes Absolute: 0.8 10*3/uL (ref 0.1–1.0)
Monocytes Relative: 10 %
Neutro Abs: 5.1 10*3/uL (ref 1.7–7.7)
Neutrophils Relative %: 66 %
Platelets: 271 10*3/uL (ref 150–400)
RBC: 3.69 MIL/uL — ABNORMAL LOW (ref 4.22–5.81)
RDW: 12.2 % (ref 11.5–15.5)
WBC: 7.6 10*3/uL (ref 4.0–10.5)
nRBC: 0 % (ref 0.0–0.2)

## 2021-11-05 LAB — CULTURE, BLOOD (ROUTINE X 2)
Culture: NO GROWTH
Culture: NO GROWTH

## 2021-11-05 LAB — MAGNESIUM: Magnesium: 2 mg/dL (ref 1.7–2.4)

## 2021-11-05 LAB — PHOSPHORUS: Phosphorus: 2.5 mg/dL (ref 2.5–4.6)

## 2021-11-05 MED ORDER — POLYETHYLENE GLYCOL 3350 17 G PO PACK
17.0000 g | PACK | Freq: Every day | ORAL | Status: DC
  Administered 2021-11-08 – 2021-11-11 (×3): 17 g via ORAL
  Filled 2021-11-05 (×4): qty 1

## 2021-11-05 MED ORDER — CARVEDILOL 12.5 MG PO TABS
12.5000 mg | ORAL_TABLET | Freq: Two times a day (BID) | ORAL | Status: DC
  Administered 2021-11-06 – 2021-11-11 (×11): 12.5 mg via ORAL
  Filled 2021-11-05 (×12): qty 1

## 2021-11-05 MED ORDER — SODIUM CHLORIDE 0.9 % IV SOLN: INTRAVENOUS | Status: DC | PRN

## 2021-11-05 MED ORDER — BETHANECHOL CHLORIDE 10 MG PO TABS
10.0000 mg | ORAL_TABLET | Freq: Three times a day (TID) | ORAL | Status: DC
  Administered 2021-11-05: 10 mg via ORAL
  Filled 2021-11-05: qty 1

## 2021-11-05 MED ORDER — PANTOPRAZOLE 2 MG/ML SUSPENSION: 40.0000 mg | Freq: Every day | ORAL | Status: DC

## 2021-11-05 MED ORDER — SODIUM CHLORIDE 0.9 % IV SOLN
INTRAVENOUS | Status: DC | PRN
Start: 2021-11-05 — End: 2021-11-05

## 2021-11-05 MED ORDER — BOOST / RESOURCE BREEZE PO LIQD CUSTOM
1.0000 | Freq: Three times a day (TID) | ORAL | Status: DC
  Administered 2021-11-05 – 2021-11-10 (×3): 1 via ORAL

## 2021-11-05 MED ORDER — CARVEDILOL 12.5 MG PO TABS
12.5000 mg | ORAL_TABLET | Freq: Two times a day (BID) | ORAL | Status: DC
  Administered 2021-11-05: 12.5 mg
  Filled 2021-11-05: qty 1

## 2021-11-05 MED ORDER — ACETAMINOPHEN 160 MG/5ML PO SOLN: 650.0000 mg | Freq: Four times a day (QID) | ORAL | Status: DC | PRN

## 2021-11-05 MED ORDER — SIMETHICONE 40 MG/0.6ML PO SUSP
80.0000 mg | Freq: Four times a day (QID) | ORAL | Status: DC | PRN
  Filled 2021-11-05: qty 1.2

## 2021-11-05 MED ORDER — POLYETHYLENE GLYCOL 3350 17 G PO PACK
17.0000 g | PACK | Freq: Every day | ORAL | Status: DC
  Filled 2021-11-05: qty 1

## 2021-11-05 MED ORDER — FOLIC ACID 1 MG PO TABS
1.0000 mg | ORAL_TABLET | Freq: Every day | ORAL | Status: DC
  Administered 2021-11-06 – 2021-11-11 (×6): 1 mg via ORAL
  Filled 2021-11-05 (×6): qty 1

## 2021-11-05 MED ORDER — POTASSIUM CHLORIDE 20 MEQ PO PACK
20.0000 meq | PACK | ORAL | Status: AC
  Administered 2021-11-05 (×2): 20 meq
  Filled 2021-11-05 (×2): qty 1

## 2021-11-05 MED ORDER — ADULT MULTIVITAMIN W/MINERALS CH
1.0000 | ORAL_TABLET | Freq: Every day | ORAL | Status: DC
  Administered 2021-11-06 – 2021-11-11 (×6): 1 via ORAL
  Filled 2021-11-05 (×6): qty 1

## 2021-11-05 MED ORDER — THIAMINE HCL 100 MG PO TABS
100.0000 mg | ORAL_TABLET | Freq: Every day | ORAL | Status: DC
Start: 2021-11-06 — End: 2021-11-12
  Administered 2021-11-06 – 2021-11-11 (×6): 100 mg via ORAL
  Filled 2021-11-05 (×6): qty 1

## 2021-11-05 MED ORDER — OXCARBAZEPINE 300 MG PO TABS
300.0000 mg | ORAL_TABLET | Freq: Two times a day (BID) | ORAL | Status: DC
  Administered 2021-11-05 – 2021-11-11 (×12): 300 mg via ORAL
  Filled 2021-11-05 (×13): qty 1

## 2021-11-05 MED ORDER — POTASSIUM CHLORIDE 10 MEQ/100ML IV SOLN
10.0000 meq | INTRAVENOUS | Status: AC
  Administered 2021-11-05 (×4): 10 meq via INTRAVENOUS
  Filled 2021-11-05 (×4): qty 100

## 2021-11-05 MED ORDER — HYDRALAZINE HCL 20 MG/ML IJ SOLN
10.0000 mg | Freq: Once | INTRAMUSCULAR | Status: AC
  Administered 2021-11-05: 10 mg via INTRAVENOUS
  Filled 2021-11-05: qty 1

## 2021-11-05 MED ORDER — METOPROLOL TARTRATE 5 MG/5ML IV SOLN: 2.5000 mg | INTRAVENOUS | Status: DC | PRN

## 2021-11-05 NOTE — Progress Notes (Signed)
FPTS Brief Progress Note ? ?S: Pt is sleeping in bed with TV on.  ? ? ?O: ?BP 111/81 (BP Location: Left Leg)   Pulse 80   Temp 98.2 ?F (36.8 ?C) (Oral)   Resp 13   Ht 5\' 11"  (1.803 m)   Wt 105.1 kg   SpO2 99%   BMI 32.32 kg/m?   ? ? ?A/P: ?Patient resting comfortably, plan per day team. No sitter in room.  ? ? , MD ?11/05/2021, 10:15 PM ?PGY-1, [facility] Night Resident  ?Please page [phone] with questions.  ? ? ?

## 2021-11-05 NOTE — Progress Notes (Signed)
Patient arrived to unit via 57M-ICU. Pt Aox3 and vitals WNL. Focused assessment completed.  ? ?Patients personal belongings at bedside.  ? ?Bed in lowest position,call bed in reach and bed alarm on. Pt oriented to the unit.  ?

## 2021-11-05 NOTE — Progress Notes (Signed)
Lower extremity venous has been completed.  ? ?Preliminary results in CV Proc.  ? ?Nicholas Estes ?11/05/2021 9:30 AM    ?

## 2021-11-05 NOTE — Consult Note (Addendum)
[facility] Face-to-Face Psychiatry Consult  ? ?Reason for Consult:  Suicidal Ideation ?Referring Physician:  Dr. Thompson Grayer ?Patient Identification: Nicholas Estes ?MRN:  [id number] ?Principal Diagnosis: Encephalopathy ?Diagnosis:  Principal Problem: ?  Encephalopathy ?Active Problems: ?  Alcohol withdrawal ([place]) ?  Pressure injury of skin ? ? ?Total Time spent with patient: 45 minutes ? ?Subjective:   ?Nicholas Estes is a 61 y.o. male patient admitted with altered mental status. Psych consult placed for suicidal ideations, limited documentation regarding events last night. However he was found with (2) empty bottles of Triletpal and atorvastatin in his room, raising concern for suicide attempt.  ? ?Patient is seen and assessed by this nurse practitioner.  He is observed to be lying in bed, with NG tube in place.  He is alert and oriented ?He denies any acute , and very pleasant.  He is noted to be jovial and euthymic throughout, engaging well with this provider.  Patient patient states "as it is understood by me the question was do you wish you were dead?  And I said yes.  Do not want to harm myself no!  While I tried to harm myself no!  If if you were in my shoes you understand why answered yes to the first question.  But I would never try to harm myself.  "Patient endorses current stressors to include son moving out post-CVA, multiple medical comorbidities lack of resources to include personal care aide, new house, nasty divorce.  He states he is in a good financial situation and has no other concerns.  He states he is collecting social security disability, and receiving retirement from the [employer].  He states prior to him becoming altered he denies any symptoms of mania at this time to include impulsivity, grandiosity, hypersexuality.  He does state "be with [facility] is suggesting that I am depressed however I just call it frustration.  I have had to relearn everything already knew how to do in my previous life and it is very  frustrating.  Like opening a can, and or holding a fork is different from me now.  For the past year we have been trying to adjust medication to help with the buzzing sensation in my upper extremities."  Depressive symptoms in the past 2 weeks to include hopelessness, worthlessness, guilty, suicidal thoughts, sadness, insomnia, weight loss/weight gain.  He denies any acute psychosis, paranoia, hallucinations, delusional thoughts.  He does not appear to be displaying any or responding to internal stimuli, external stimuli, and or exhibiting delusional thought disorder.  Patient denies any access to weapons, denies any history or current use of alcohol and or illicit substance use. Patient denies any auditory and/or visual hallucinations, does not appear to be responding to internal or external stimuli.  There is no evidence of delusional thought content and patient appears to answer all questions appropriately.  At this time patient appears to be psychiatrically stable to discharge home, with support system services in place.     ? ?On evaluation patient is alert and oriented, calm and cooperative, very pleasant upon approach.  He admits to positive depression screening, and answering yes to " do you wish you were dead?  he does endorse ongoing triggers and stressors to include finalization of divorce, new home, son moving out, and multiple medical comorbidities.  He is currently receiving all medical and psychiatric services through [facility]. He does appear to be open to seeking therapy, and continues to decline any new and or additional psychotropic medication, as  he is currently taking 18 pills to treat his other medical comorbidities and declines any further medication.  He strongly believes that his mood lability and anhedonia, are related to his stroke and is open to therapy as he has had to relearn how to live again.  ? ?HPI:  Nicholas Estes is a 61 y.o. male who presented with altered mental status of  unknown etiology, now s/p ICU stay requiring intubation for agitated delirium and with concern for ileus vs SBO. PMH is significant for alcohol abuse, tremors, afib on eliquis, stroke, and conversion disorder. ?  ?Past Psychiatric History: He denies any previous psychiatric diagnoses. He states the [place] suggests he is having emotional stress and mood changes secondary to stroke, but he has declined medication at this time. He denies any current psychotropic medications, he was previously prescribed trileptal for mood changes 2/t stroke, has opted for therapy vs polypharmacy. He denies any history of suicide attempt, suicidal ideations, and or self harm.  ? ?Risk to Self:  Denies ?Risk to Others:   Denies ?Prior Inpatient Therapy:   Denies ?Prior Outpatient Therapy:   Denies, receiving outpatient services through [facility].  ? ?Past Medical History:  ?Past Medical History:  ?Diagnosis Date  ? Atrial fibrillation (HCC)   ? Hypertension   ? 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  ?Past Surgical History:  ?Procedure Laterality Date  ? IR CT HEAD LTD  11/04/2019  ? IR CT HEAD LTD  11/04/2019  ? IR PERCUTANEOUS ART THROMBECTOMY/INFUSION INTRACRANIAL INC DIAG ANGIO  11/04/2019  ? IR US GUIDE VASC ACCESS RIGHT  11/04/2019  ? RADIOLOGY WITH ANESTHESIA N/A 11/04/2019  ? Procedure: IR WITH ANESTHESIA;  Surgeon: Julieanne Cotton, MD;  Location: [facility] OR;  Service: Radiology;  Laterality: N/A;  ? ?Family History: History reviewed. No pertinent family history. ?Family Psychiatric  History: He reports a significant amount of substance abuse "you can call them all crackpots."  ?Social History:  ?Social History  ? ?Substance and Sexual Activity  ?Alcohol Use None  ?   ?Social History  ? ?Substance and Sexual Activity  ?Drug Use Not on file  ?  ?Social History  ? ?Socioeconomic History  ? Marital status: Divorced  ?  Spouse name: Not on file  ? Number of children: Not on file  ? Years of education: Not on file  ? Highest education  level: Not on file  ?Occupational History  ? Not on file  ?Tobacco Use  ? Smoking status: Unknown  ? Smokeless tobacco: Not on file  ?Substance and Sexual Activity  ? Alcohol use: Not on file  ? Drug use: Not on file  ? Sexual activity: Not on file  ?Other Topics Concern  ? Not on file  ?Social History Narrative  ? Not on file  ? ?Social Determinants of Health  ? ?Financial Resource Strain: Not on file  ?Food Insecurity: Not on file  ?Transportation Needs: Not on file  ?Physical Activity: Not on file  ?Stress: Not on file  ?Social Connections: Not on file  ? ?Additional Social History: ?  ? ?Allergies:  No Known Allergies ? ?Labs:  ?Results for orders placed or performed during the hospital encounter of 10/28/21 (from the past 48 hour(s))  ?Glucose, capillary     Status: Abnormal  ? Collection Time: 11/03/21 11:47 AM  ?Result Value Ref Range  ? Glucose-Capillary 105 (H) 70 - 99 mg/dL  ?  Comment: Glucose reference range applies only to samples taken after fasting for  at least 8 hours.  ?Glucose, capillary     Status: None  ? Collection Time: 11/03/21  4:06 PM  ?Result Value Ref Range  ? Glucose-Capillary 91 70 - 99 mg/dL  ?  Comment: Glucose reference range applies only to samples taken after fasting for at least 8 hours.  ?Glucose, capillary     Status: None  ? Collection Time: 11/03/21  7:40 PM  ?Result Value Ref Range  ? Glucose-Capillary 84 70 - 99 mg/dL  ?  Comment: Glucose reference range applies only to samples taken after fasting for at least 8 hours.  ?Glucose, capillary     Status: None  ? Collection Time: 11/03/21 11:21 PM  ?Result Value Ref Range  ? Glucose-Capillary 84 70 - 99 mg/dL  ?  Comment: Glucose reference range applies only to samples taken after fasting for at least 8 hours.  ?CBC with Differential/Platelet     Status: Abnormal  ? Collection Time: 11/04/21  3:11 AM  ?Result Value Ref Range  ? WBC 7.9 4.0 - 10.5 K/uL  ? RBC 3.91 (L) 4.22 - 5.81 MIL/uL  ? Hemoglobin 13.0 13.0 - 17.0 g/dL  ? HCT  37.7 (L) 39.0 - 52.0 %  ? MCV 96.4 80.0 - 100.0 fL  ? MCH 33.2 26.0 - 34.0 pg  ? MCHC 34.5 30.0 - 36.0 g/dL  ? RDW 12.3 11.5 - 15.5 %  ? Platelets 229 150 - 400 K/uL  ? nRBC 0.0 0.0 - 0.2 %  ? Neutrophils Relative

## 2021-11-05 NOTE — Evaluation (Signed)
Physical Therapy Evaluation ?Patient Details ?Name: Nicholas Estes ?MRN: [id number] ?DOB: 1961/01/21 ?Today's Date: 11/05/2021 ? ?History of Present Illness ? 61 year old Xadmitted 4/11 with altered mental status, found to have acute metabolic encephalopathy and aspiration PNA. MRI showing remote right occipital infarct , EEG negative for seizures , intubated/ 4/12-16. PMH: A-fib, bilateral CVA, and conversion disorder with pseudoseizures  ?Clinical Impression ? PTA reports living with girlfriend in single story home with ramped entrance. Pt reports that he was ambulating approximately 50 feet with RW longer distances he needs a wheelchair. Pt reports that he has been working on using a cane for ambulation and was excited that he could walk a few steps. Pt has [facility] for 4 hours 4 days a week for assist with bathing and dressing and some iADLs. Pt is currently limited in safe mobility by decreased cognition, especially in terms of situation for hospitalization and safety awareness. Pt is currently total A for bed mobility and unable to come to standing which makes pt tearful stating " I was doing so well." PT recommending SNF level rehab to return to PLOF. PT will continue to see acutely. ?   ? ?Recommendations for follow up therapy are one component of a multi-disciplinary discharge planning process, led by the attending physician.  Recommendations may be updated based on patient status, additional functional criteria and insurance authorization. ? ?Follow Up Recommendations Skilled nursing-short term rehab (<3 hours/day) ? ?  ?Assistance Recommended at Discharge Frequent or constant Supervision/Assistance  ?Patient can return home with the following ? Two people to help with walking and/or transfers;Two people to help with bathing/dressing/bathroom;Assistance with cooking/housework;Direct supervision/assist for medications management;Assistance with feeding;Direct supervision/assist for financial management;Assist  for transportation;Help with stairs or ramp for entrance ? ?  ?Equipment Recommendations    ?Recommendations for Other Services ? OT consult  ?  ?Functional Status Assessment Patient has had a recent decline in their functional status and demonstrates the ability to make significant improvements in function in a reasonable and predictable amount of time.  ? ?  ?Precautions / Restrictions Precautions ?Precautions: Fall ?Precaution Comments: long history of falling, ?Restrictions ?Weight Bearing Restrictions: No  ? ?  ? ?Mobility ? Bed Mobility ?Overal bed mobility: Needs Assistance ?Bed Mobility: Supine to Sit, Sit to Supine ?  ?  ?Supine to sit: Mod assist, HOB elevated, Max assist ?Sit to supine: Total assist, +2 for physical assistance ?  ?General bed mobility comments: modA for management of R LE across bed and for pt to pull against therapist to come to EoB, max A required to pad scoot hips to EoB so feet could touch the floor, total A for return to bed ?  ? ?Transfers ?Overall transfer level: Needs assistance ?  ?Transfers: Sit to/from Stand ?Sit to Stand: Total assist, +2 physical assistance ?  ?  ?  ?  ?  ?General transfer comment: attempted to come to standing EoB, however pt with poor proprioception on L and increased R lateral lean pt unable to achieve posture suitable for power up to standing without assist of 2 ?  ? ?  ? ?  ? ?Balance Overall balance assessment: Needs assistance ?Sitting-balance support: Feet supported, Feet unsupported, Bilateral upper extremity supported ?Sitting balance-Leahy Scale: Poor ?Sitting balance - Comments: pt requires outside 90% of time to maintain seated balance for 10 minutes while medication administered ?  ?  ?  ?  ?  ?  ?  ?  ?  ?  ?  ?  ?  ?  ?  ?   ? ? ? ?  Pertinent Vitals/Pain Pain Assessment ?Pain Assessment: Faces ?Faces Pain Scale: Hurts a little bit ?Pain Location: abdomen with especially with forward flexion due to increased distension ?Pain Descriptors /  Indicators: Tightness ?Pain Intervention(s): Limited activity within patient's tolerance, Monitored during session, Repositioned  ? ? ?Home Living Family/patient expects to be discharged to:: Private residence ?Living Arrangements: Non-relatives/Friends (girlfriend) ?Available Help at Discharge: Friend(s);Available PRN/intermittently;Other (Comment) (CNA M-T for 4 hr/day) ?Type of Home: House ?Home Access: Ramped entrance ?  ?  ?  ?Home Layout: One level ?Home Equipment: Conservation officer, nature (2 wheels);Cane - single point;BSC/3in1;Shower seat;Grab bars - tub/shower;Hand held shower head;Wheelchair - manual ?   ?  ?Prior Function Prior Level of Function : Needs assist ? Cognitive Assist : Mobility (cognitive);ADLs (cognitive) ?Mobility (Cognitive): Set up cues ?ADLs (Cognitive): Intermittent cues ?Physical Assist : Mobility (physical);ADLs (physical) ?Mobility (physical): Gait;Stairs ?ADLs (physical): IADLs;Bathing;Dressing;Toileting ?Mobility Comments: reports ambulates up to 50 feet with RW, greater distances require wheelchair, however just recently he was able to take some steps with a cane ?ADLs Comments: CNAs assist with ADLs, and iADLs ?  ? ? ?Extremity/Trunk Assessment  ? Upper Extremity Assessment ?Upper Extremity Assessment: LUE deficits/detail;Defer to OT evaluation ?LUE Sensation: decreased light touch;decreased proprioception ?  ? ?Lower Extremity Assessment ?Lower Extremity Assessment: RLE deficits/detail;LLE deficits/detail ?RLE Deficits / Details: R hip externally rotated and lacking good flexion, knee and ankle ROM WFL, strength grossly 3/5 in hip and 3+/5 in knee and ankle ?RLE Sensation: WNL ?RLE Coordination: decreased fine motor ?LLE Deficits / Details: ROM WFL, strength grossly 3+/5, sensation greatly impaired unable to determine when foot was actually on floor attempting to stand with L foot up in air, despite cues to fix pt unable to find floor unless increased pressure placed on LE to provide  contact from foot on floor ?LLE Sensation: decreased light touch;decreased proprioception ?LLE Coordination: decreased fine motor;decreased gross motor ?  ? ?   ?Communication  ?    ?Cognition Arousal/Alertness: Awake/alert ?Behavior During Therapy: Restless, Flat affect ?Overall Cognitive Status: No family/caregiver present to determine baseline cognitive functioning ?  ?  ?  ?  ?  ?  ?  ?  ?  ?  ?  ?  ?  ?  ?  ?  ?General Comments: oriented to person time and place however when asked situation he started talking about explosion of a ball with a child in it ?  ?  ? ?  ?General Comments General comments (skin integrity, edema, etc.): VSS on RA, when pleth reading good ? ?  ?   ? ?Assessment/Plan  ?  ?PT Assessment Patient needs continued PT services  ?PT Problem List Decreased strength;Decreased activity tolerance;Decreased balance;Decreased mobility;Decreased coordination;Decreased cognition;Decreased safety awareness;Pain ? ?   ?  ?PT Treatment Interventions DME instruction;Gait training;Stair training;Therapeutic activities;Balance training;Therapeutic exercise;Functional mobility training;Cognitive remediation;Patient/family education   ? ?PT Goals (Current goals can be found in the Care Plan section)  ?Acute Rehab PT Goals ?Patient Stated Goal: get back to walking with cane ?PT Goal Formulation: With patient ?Time For Goal Achievement: 11/19/21 ?Potential to Achieve Goals: Fair ? ?  ?Frequency Min 2X/week ?  ? ? ?   ?AM-PAC PT "6 Clicks" Mobility  ?Outcome Measure Help needed turning from your back to your side while in a flat bed without using bedrails?: A Little ?Help needed moving from lying on your back to sitting on the side of a flat bed without using bedrails?: Total ?Help needed moving to and from a bed  to a chair (including a wheelchair)?: Total ?Help needed standing up from a chair using your arms (e.g., wheelchair or bedside chair)?: Total ?Help needed to walk in hospital room?: Total ?Help needed  climbing 3-5 steps with a railing? : Total ?6 Click Score: 8 ? ?  ?End of Session   ?Activity Tolerance: Patient limited by fatigue ?Patient left: in bed;with call bell/phone within reach;with nursing/sitter in room

## 2021-11-05 NOTE — Progress Notes (Signed)
? ?NAME:  Nicholas Estes [place], MRN:  [id number], DOB:  12/01/1960, LOS: 8 ?ADMISSION DATE:  10/28/2021, CONSULTATION DATE:  4/11 ?REFERRING MD:  FMTS , REASON FOR CONSULT: Agitation and hypotension  ? ?History of Present Illness:  ? ?Nicholas Estes, is a 61 y.o. male, who [facility] with a chief complaint of Altered mental status ? ?Past medical history of ETOH abuse, afib, HTN, stroke, tobacco use, and conversion disorder, non epileptiform spells. ? ?Prior to arrival, he was reportedly visited by a friend and found altered with vomit near the patient. EMS reports of empty med bottles of oxcarbazepine and atorvastatin.  ? ?[facility] No acute changes on head CT. Poison control contacted in the ED. The patient developed agitation while in the ED. The were given 2.5 of haldol and 2 mg of ativan. They then developed on isolated episode of hypotension (60/41). 1 L of IVF was given. ? ?PCCM was consulted for evaluation of hypotension and agitation.  Overnight patient became agitated, was transferred to ICU for close monitoring and Precedex infusion ? ?Pertinent  Medical History  ?Etoh use disorder ?Mood disorder ?Substance abuse ?HFpEF ?Tobacco use  ? ?Significant Hospital Events: ?Including procedures, antibiotic start and stop dates in addition to other pertinent events   ?4/11 ED via EMS for AMS. Admit to FPTS with concern for Dts. PCCM consult for agitation + medication related hypotension  ?4/12 intubated ?4/16-extubated, Eliquis stopped and Lovenox started ?4/14 MRI -remote right occipital infarct , EEG negative for seizures ?4/17 CT abdomen/pelvis showed ileus ?4/18 Precedex tapered to off  ?4/19 CT abdomen/pelvis for RLQ pain no appendicitis ? ? ?Interim History / Subjective:  ? ?Events overnight noted, developed right lower quadrant pain and repeat CT abdomen was obtained negative for appendicitis. ?Diuresed well with Lasix ?Afebrile ?Remains on room air ?Continues to have  liquid stool in Flexi-Seal ?Placed on suicide watch overnight.  On detailed questioning, he denies suicidal intentions ?"Can I have solid food" ? ?Objective   ?Blood pressure (!) 184/110, pulse (!) 104, temperature 98.3 ?F (36.8 ?C), temperature source Oral, resp. rate 16, height 5\' 11"  (1.803 m), weight 105.1 kg, SpO2 96 %. ?   ?   ? ?Intake/Output Summary (Last 24 hours) at 11/05/2021 0841 ?Last data filed at 11/05/2021 0800 ?Gross per 24 hour  ?Intake 1280.4 ml  ?Output 3860 ml  ?Net -2579.6 ml  ? ? ?Filed Weights  ? 11/04/21 0500 11/05/21 0003 11/05/21 0500  ?Weight: 109.4 kg 105.1 kg 105.1 kg  ? ? ?Examination: ?  ?General: Middle-aged man, no distress, off restraints ?HEENT: MM pink and dry, PERRLA ?neuro: Alert oriented x3 , ?Chest: Decreased breath sounds bilateral, no accessory muscle use ?Heart: S1-S2 regular ?Abdomen: Soft, nontender, distended, bowel sounds present, ?Skin: No rash No lesions, Clean dry and intact ? ? ?Labs show severe hypokalemia, no leukocytosis ? ?Resolved Hospital Problem list   ?Medication related hypotension ?Acute hypoxic/hypercapnic respiratory failure - Extubated 4/16 ?Aspiration pneumonia -- Unasyn dosing completed.  ? ?Assessment & Plan:  ? ?Acute Metabolic encephalopathy with agitated delirium, resolving ?-Does not appear to be EtOH withdrawal, reports drinking 3-4 times per week ?-Weaned off Precedex ?-Resumed bupropion, Trileptal ?- Continue thiamine, multivitamin ?-He denies suicidal intention but is confused as to why he is here today, await psychiatry input but overall appears much improved ? ?Ileus -CT abdomen confirms no bowel obstruction ?-He has loose stools, Flexi-Seal ?-Tube feeds can be resumed ?-Replete hypokalemia aggressively ? ?  Paroxysmal atrial fibrillation -continue Lovenox ?Start Coreg and discontinue amiodarone ?Use Lopressor as needed for breakthrough RVR ? ?Dysphagia postextubation -reassess swallow today ? ?PT eval and mobilize ? ?PCCM will follow along  while in the ICU but appreciate F PTS taking over primary service ? ?Best Practice (right click and "Reselect all SmartList Selections" daily)  ? ?Diet/type: NPO  ?DVT prophylaxis: systemic heparin ?GI prophylaxis: N/A ?Lines: N/A ?Foley:  N/A ?Code Status:  full code ?Last date of multidisciplinary goals of care discussion 4/12 ? ?Labs   ?CBC: ?Recent Labs  ?Lab 11/01/21 ?3382 11/02/21 ?0231 11/03/21 ?5053 11/04/21 ?9767 11/05/21 ?3419  ?WBC 7.0 6.9 8.7 7.9 7.6  ?NEUTROABS  --   --  5.5 5.0 5.1  ?HGB 12.0* 12.1* 11.6* 13.0 12.2*  ?HCT 35.3* 35.2* 34.8* 37.7* 34.7*  ?MCV 98.3 98.6 100.3* 96.4 94.0  ?PLT 167 180 203 229 271  ? ? ? ?Basic Metabolic Panel: ?Recent Labs  ?Lab 10/30/21 ?3790 10/30/21 ?1511 10/31/21 ?0023 10/31/21 ?1143 10/31/21 ?1245 11/01/21 ?2409 11/02/21 ?0231 11/03/21 ?7353 11/04/21 ?2992 11/05/21 ?4268  ?NA 141   < > 134*   < > 140 141 142 143 137 136  ?K 2.8*   < > 3.7   < > 3.8 4.5 4.5 4.6 3.1* 3.0*  ?CL 108   < > 108  --  109 112* 111 108 103 99  ?CO2 23   < > 15*  --  24 23 24 28 23 25   ?GLUCOSE 120*   < > 79  --  104* 148* 108* 104* 90 101*  ?BUN 8   < > <5*  --  8 15 13 11 8  5*  ?CREATININE 0.97   < > 0.37*  --  0.76 0.89 0.77 0.79 0.86 0.73  ?CALCIUM 7.6*   < > 6.5*  --  7.2* 7.5* 7.9* 8.4* 8.2* 8.1*  ?MG 1.8  --  1.1*  --  2.1 2.0 2.0 2.0 1.9 2.0  ?PHOS 1.7*  --  1.2*  --  3.2 4.0  --   --   --  2.5  ? < > = values in this interval not displayed.  ? ? ?GFR: ?Estimated Creatinine Clearance: 119.6 mL/min (by C-G formula based on SCr of 0.73 mg/dL). ?Recent Labs  ?Lab 10/31/21 ? 10/31/21 ?1245 11/01/21 ?11/02/21 11/02/21 ?0231 11/03/21 ?11/04/21 11/04/21 ?9798 11/05/21 ?9211  ?WBC  --   --    < > 6.9 8.7 7.9 7.6  ?LATICACIDVEN 1.4 1.3  --   --   --   --   --   ? < > = values in this interval not displayed.  ? ? ? ?Liver Function Tests: ?No results for input(s): AST, ALT, ALKPHOS, BILITOT, PROT, ALBUMIN in the last 168 hours. ? ?No results for input(s): LIPASE, AMYLASE in the last 168 hours. ?No  results for input(s): AMMONIA in the last 168 hours. ? ? ?ABG ?   ?Component Value Date/Time  ? PHART 7.351 10/31/2021 1143  ? PCO2ART 42.8 10/31/2021 1143  ? PO2ART 82 (L) 10/31/2021 1143  ? HCO3 23.2 10/31/2021 1143  ? TCO2 24 10/31/2021 1143  ? ACIDBASEDEF 2.0 10/31/2021 1143  ? O2SAT 94 10/31/2021 1143  ? ?  ? ?Coagulation Profile: ?No results for input(s): INR, PROTIME in the last 168 hours. ? ? ?Cardiac Enzymes: ?Recent Labs  ?Lab 10/30/21 ?11/02/2021 10/31/21 ?1245 11/01/21 ?11/02/21 11/03/21 ?0654 11/04/21 ?11/05/21  ?CKTOTAL 1,059* 487* 1,227* 500* 303  ? ? ? ?HbA1C: ?Hgb A1c  MFr Bld  ?Date/Time Value Ref Range Status  ?11/05/2019 05:52 AM 6.0 (H) 4.8 - 5.6 % Final  ?  Comment:  ?  (NOTE) ?Pre diabetes:          5.7%-6.4% ?Diabetes:              >6.4% ?Glycemic control for   <7.0% ?adults with diabetes ?  ? ? ?CBG: ?Recent Labs  ?Lab 11/04/21 ?1521 11/04/21 ?1918 11/04/21 ?2350 11/05/21 ?9767 11/05/21 ?0731  ?GLUCAP 107* 108* 104* 105* 92  ? ? ?Cyril Mourning MD. Tonny Bollman. ?[facility] ?Pager : [phone] ? ?If no response to pager , please call [phone] until 7 pm ?After 7:00 pm call Elink  [phone]  ? ? ? ?11/05/2021 ?8:41 AM ? ? ?

## 2021-11-05 NOTE — Progress Notes (Signed)
Nutrition Follow-up ? ?DOCUMENTATION CODES:  ? ?Not applicable ? ?INTERVENTION:  ? ?- Boost Breeze po TID, each supplement provides 250 kcal and 9 grams of protein ? ?- MVI with minerals daily ? ?NUTRITION DIAGNOSIS:  ? ?Inadequate oral intake related to inability to eat as evidenced by NPO status. ? ?Progressing, pt now on dysphagia 3 diet ? ?GOAL:  ? ?Patient will meet greater than or equal to 90% of their needs ? ?Progressing ? ?MONITOR:  ? ?PO intake, Supplement acceptance, Labs, Weight trends, Skin, I & O's ? ?REASON FOR ASSESSMENT:  ? ?Consult ?Enteral/tube feeding initiation and management (trickle tube feeds) ? ?ASSESSMENT:  ? ?61 year old male who presented to the ED on 4/11 with AMS after medication overdose. 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 Pt admitted with acute encephalopathy. ? ?04/12 - intubated ?04/16 - extubated ?04/17 - CT abdomen/pelvis consistent with ileus, NG tube placed for suction ?04/18 - TF started at trickle rate via NG tube ?04/19 - diet advanced to dysphagia 3 ? ?Discussed pt with RN and during ICU rounds. Pt still with NG tube in place, currently clamped. CT abdomen/pelvis this morning consistent with persistent ileus. Pt not endorsing nausea at this time but noted pt did report nausea overnight. Discussed pt with RN and MD. Diet has been advanced to dysphagia 3. RD will trial clear liquid oral nutrition supplement and assess for tolerance. ? ?Admit weight: 107.3 kg ?Current weight: 105.1 kg ? ?Pt with deep pitting generalized edema per nursing assessment. ? ?Medications reviewed and include: folic acid, MVI with minerals, protonix, miralax, thiamine ? ?Labs reviewed: potassium 3.0 (received replacement) ?CBG's: 92-108 x 24 hours ? ?UOP: 3370 ml x 24 hours ?Rectal tube: 250 ml + 2 unmeasured occurrences x 24 hours ?I/O's: +6.7 L since admit ? ?Diet Order:   ?Diet Order   ? ?       ?   DIET DYS 3 Room service appropriate? Yes; Fluid consistency: Thin  Diet effective now       ?  ? ?  ?  ? ?  ? ? ?EDUCATION NEEDS:  ? ?No education needs have been identified at this time ? ?Skin:  Skin Assessment: ?Skin Integrity Issues: ?Stage I: coccyx ? ?Last BM:  11/05/21 rectal tube ? ?Height:  ? ?Ht Readings from Last 1 Encounters:  ?10/29/21 5\' 11"  (1.803 m)  ? ? ?Weight:  ? ?Wt Readings from Last 1 Encounters:  ?11/05/21 105.1 kg  ? ? ?Ideal Body Weight:  78.2 kg ? ?BMI:  Body mass index is 32.32 kg/m?. ? ?Estimated Nutritional Needs:  ? ?Kcal:  2200-2400 ? ?Protein:  110-130 grams ? ?Fluid:  >/= 2.0 L ? ? ? ?Gustavus Bryant, MS, RD, LDN ?Inpatient Clinical Dietitian ?Please see AMiON for contact information. ? ?

## 2021-11-05 NOTE — Progress Notes (Signed)
[facility] ADULT ICU REPLACEMENT PROTOCOL ? ? ?The patient does apply for the [facility] Adult ICU Electrolyte Replacment Protocol based on the criteria listed below:  ? ?1.Exclusion criteria: TCTS patients, ECMO patients, and Dialysis patients ?2. Is GFR >/= 30 ml/min? Yes.    ?Patient's GFR today is >60 ?3. Is SCr </= 2? Yes.   ?Patient's SCr is 0.73 mg/dL ?4. Did SCr increase >/= 0.5 in 24 hours? No. ?5.Pt's weight >40kg  Yes.   ?6. Abnormal electrolyte(s): K+ 3.0  ?7. Electrolytes replaced per protocol ?8.  Call MD STAT for K+ </= 2.5, Phos </= 1, or Mag </= 1 ?Physician:  n/a ? ?Melvern Banker 11/05/2021 3:22 AM ? ?

## 2021-11-05 NOTE — Progress Notes (Addendum)
Speech Language Pathology Treatment: Dysphagia  ?Patient Details ?Name: Nicholas Estes ?MRN: [id number] ?DOB: [birth year] ?Today's Date: 11/05/2021 ?Time: 7948-0165 ?SLP Time Calculation (min) (ACUTE ONLY): 25 min ? ?Assessment / Plan / Recommendation ?Clinical Impression ? Pt was seen at bedside in order to target dysphagia goals in functional manner. Pt appeared more alert, engaged and cooperative with clinicians since last targeted session (11/05/21). Pt has NG tube in place. SLP presented pt with thin liquids via straw cup, and consumed ~8oz with mild throat clearing. Throat clearing may be a result of irritation from large NG tube in place. During yesterday's session, no signs of aspiration were observed with thin liquids prior to NG placement. SLP branched up to puree texture applesauce, and pt consumed ~2 tsps before endorsing that it did not taste good. Further trials incorporated regular texture graham cracker. Pt required moderate verbal and tactile cues in order to engage in assisted self-feeding of graham cracker. He demonstrated adequate lateralization, mastication, and oral clearance of bolus. Pt verbalized needing a liquid wash following graham cracker. Pt swished water around in oral cavity before engaging in pharyngeal swallow. SLP recommends the pt initiate dysphagia 3 diet with thin liquids. SLP will continue to follow. ?  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 Pt has a history of mild dysaphgia following CVA in 2021. MBS showed mild residue and impaired timing, no penetration or aspiration. DYs 3/thin recommended with supervision given AMS. ?  ?   ?SLP Plan ? Continue with current plan of care ? ?  ?  ?Recommendations for follow up therapy are one component of a multi-disciplinary discharge planning process, led by the attending physician.  Recommendations may be  updated based on patient status, additional functional criteria and insurance authorization. ?  ? ?Recommendations  ?Diet recommendations: Thin liquid;Dysphagia 3 (mechanical soft) ?Liquids provided via: Straw ?Medication Administration: Whole meds with liquid ?Supervision: Staff to assist with self feeding ?Compensations: Minimize environmental distractions;Slow rate ?Postural Changes and/or Swallow Maneuvers: Seated upright 90 degrees  ?   ?    ?   ? ? ? ? Oral Care Recommendations: Oral care QID ?Follow Up Recommendations: No SLP follow up ?Assistance recommended at discharge: Other (comment) ?SLP Visit Diagnosis: Dysphagia, unspecified (R13.10) ?Plan: Continue with current plan of care ? ? ? ? ?  ?  ? ? ?Ezekiel Slocumb ? ?11/05/2021, 10:17 AM ?

## 2021-11-05 NOTE — Progress Notes (Signed)
eLink Physician-Brief Progress Note ?Patient Name: Nicholas Estes ?DOB: 1961-03-14 ?MRN: [id number] ? ? ?Date of Service ? 11/05/2021  ?HPI/Events of Note ? Patient is verbalizing suicidal thoughts.  ?eICU Interventions ? Suicide precautions ordered, and psychiatry consultation ordered.  ? ? ? ?  ? ?Frederik Pear ?11/05/2021, 6:19 AM ?

## 2021-11-05 NOTE — Progress Notes (Addendum)
eLink Physician-Brief Progress Note ?Patient Name: Nicholas Estes ?DOB: 1961-05-17 ?MRN: [id number] ? ? ?Date of Service ? 11/05/2021  ?HPI/Events of Note ? Patient with right lower quadrant pain / tenderness, no guarding or rebound, he had complained of nausea earlier. Elevated BP. Right leg with > edema than left leg, pulse also diminished compared with left but both extremities are warm, with normal bilateral capillary refill.  ?eICU Interventions ? Non-contrast CT of the abdomen / pelvis ordered to r/o appendicitis, Hydralazine 10 mg iv x 1 now ordered. Will order a right lower extremity doppler ultrasound to r/o DVT.  ? ? ? ?  ? ?Frederik Pear ?11/05/2021, 3:32 AM ?
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
## 2021-11-06 DIAGNOSIS — G9340 Encephalopathy, unspecified: Secondary | ICD-10-CM | POA: Diagnosis not present

## 2021-11-06 DIAGNOSIS — R4182 Altered mental status, unspecified: Secondary | ICD-10-CM | POA: Diagnosis not present

## 2021-11-06 LAB — BASIC METABOLIC PANEL
Anion gap: 8 (ref 5–15)
BUN: 5 mg/dL — ABNORMAL LOW (ref 8–23)
CO2: 23 mmol/L (ref 22–32)
Calcium: 8 mg/dL — ABNORMAL LOW (ref 8.9–10.3)
Chloride: 105 mmol/L (ref 98–111)
Creatinine, Ser: 0.73 mg/dL (ref 0.61–1.24)
GFR, Estimated: >60 mL/min (ref >60)
Glucose, Bld: 96 mg/dL (ref 70–99)
Potassium: 3.9 mmol/L (ref 3.5–5.1)
Sodium: 136 mmol/L (ref 135–145)

## 2021-11-06 LAB — PHOSPHORUS: Phosphorus: 2.3 mg/dL — ABNORMAL LOW (ref 2.5–4.6)

## 2021-11-06 LAB — GLUCOSE, CAPILLARY
Glucose-Capillary: 100 mg/dL — ABNORMAL HIGH (ref 70–99)
Glucose-Capillary: 106 mg/dL — ABNORMAL HIGH (ref 70–99)
Glucose-Capillary: 109 mg/dL — ABNORMAL HIGH (ref 70–99)
Glucose-Capillary: 143 mg/dL — ABNORMAL HIGH (ref 70–99)

## 2021-11-06 LAB — CBC WITH DIFFERENTIAL/PLATELET
Abs Immature Granulocytes: 0.05 10*3/uL (ref 0.00–0.07)
Basophils Absolute: 0 10*3/uL (ref 0.0–0.1)
Basophils Relative: 0 %
Eosinophils Absolute: 0.3 10*3/uL (ref 0.0–0.5)
Eosinophils Relative: 4 %
HCT: 32.1 % — ABNORMAL LOW (ref 39.0–52.0)
Hemoglobin: 11.1 g/dL — ABNORMAL LOW (ref 13.0–17.0)
Immature Granulocytes: 1 %
Lymphocytes Relative: 22 %
Lymphs Abs: 1.5 10*3/uL (ref 0.7–4.0)
MCH: 32.8 pg (ref 26.0–34.0)
MCHC: 34.6 g/dL (ref 30.0–36.0)
MCV: 95 fL (ref 80.0–100.0)
Monocytes Absolute: 0.7 10*3/uL (ref 0.1–1.0)
Monocytes Relative: 10 %
Neutro Abs: 4.4 10*3/uL (ref 1.7–7.7)
Neutrophils Relative %: 63 %
Platelets: 287 10*3/uL (ref 150–400)
RBC: 3.38 MIL/uL — ABNORMAL LOW (ref 4.22–5.81)
RDW: 12.1 % (ref 11.5–15.5)
WBC: 7 10*3/uL (ref 4.0–10.5)
nRBC: 0 % (ref 0.0–0.2)

## 2021-11-06 LAB — MAGNESIUM: Magnesium: 1.9 mg/dL (ref 1.7–2.4)

## 2021-11-06 MED ORDER — SIMETHICONE 80 MG PO CHEW: 80.0000 mg | CHEWABLE_TABLET | Freq: Four times a day (QID) | ORAL | Status: DC | PRN

## 2021-11-06 MED ORDER — POTASSIUM CHLORIDE 20 MEQ PO PACK
40.0000 meq | PACK | Freq: Once | ORAL | Status: AC
  Administered 2021-11-06: 40 meq via ORAL
  Filled 2021-11-06: qty 2

## 2021-11-06 MED ORDER — PANTOPRAZOLE SODIUM 40 MG PO TBEC: 40.0000 mg | DELAYED_RELEASE_TABLET | Freq: Every day | ORAL | Status: DC

## 2021-11-06 MED ORDER — APIXABAN 5 MG PO TABS
5.0000 mg | ORAL_TABLET | Freq: Two times a day (BID) | ORAL | Status: DC
  Administered 2021-11-06 – 2021-11-11 (×11): 5 mg via ORAL
  Filled 2021-11-06 (×11): qty 1

## 2021-11-06 MED ORDER — MAGNESIUM SULFATE 2 GM/50ML IV SOLN
2.0000 g | Freq: Once | INTRAVENOUS | Status: AC
  Administered 2021-11-06: 2 g via INTRAVENOUS
  Filled 2021-11-06: qty 50

## 2021-11-06 MED ORDER — ACETAMINOPHEN 325 MG PO TABS: 650.0000 mg | ORAL_TABLET | Freq: Four times a day (QID) | ORAL | Status: DC | PRN

## 2021-11-06 MED ORDER — TAMSULOSIN HCL 0.4 MG PO CAPS
0.4000 mg | ORAL_CAPSULE | Freq: Every day | ORAL | Status: DC
Start: 2021-11-06 — End: 2021-11-12
  Administered 2021-11-06 – 2021-11-11 (×6): 0.4 mg via ORAL
  Filled 2021-11-06 (×6): qty 1
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
## 2021-11-06 NOTE — Progress Notes (Addendum)
Family Medicine Teaching Service ?Daily Progress Note ?Intern Pager: [phone] ? ?Patient name: Nicholas Estes Medical record number: [id number] ?Date of birth: 07-Dec-1960 Age: 61 y.o. Gender: male ? ?Primary Care Provider: [facility] ?Consultants: PCCM, psychiatry ?Code Status: Full ? ?Pt Overview and Major Events to Date:  ?4/11- admired, acutely agitated ?4/11- transferred to ICU ?4/12- intubated ?4/16- extubated ?4/20- transferred back to FPTS ? ?Assessment and Plan: ?Nicholas Estes is a 61 y.o. male who presented with altered mental status of unknown etiology, now s/p ICU stay requiring intubation for agitated delirium and with concern for ileus vs SBO. PMH is significant for alcohol abuse, tremors, afib on eliquis, stroke, and conversion disorder. ? ?Acute Metabolic Encephalopathy with Agitated Delirium, Resolved ?Previous suicidal thoughts ?History of substance abuse (THC and etOH) ?Patient is pleasant and oriented this morning; remains without insight regarding the events leading up to hospitalization but reports feeling better. Overdose on statin and Trileptal continues to be the most likely etiology given elevated oxcarb level and subsequent rhabdomyolysis. Psychiatry following, attempting to obtain collateral given high risk factors for suicide but felt tht sitter could be discontinued for now. Psychiatry also with recommendation to hold bupropion. Patient denies and SI or thoughts of death this am.  ?- Continue Trileptal, will have pharmacy confirm his home dosing.  ?- Psychiatry following, appreciated recs ?- PT/OT recommending SNF once cleared by psychiatry ?- TOC consulted for SNF ? ?Ileus, improving  Dysphagia s/p extubation ?Inadequate Oral Intake ?Abdominal pain and distention are much improved. NG discontinued yesterday, patient tolerating dysphagia 3 diet without issue. Patient feels up to eating and drinking ad lib today. Had two BM yesterday, no concern for SBO at this time. Phos low  at 2.3, suspect 2/2 poor PO.  ?- Remove flexiseal ?- Boost breeze TID ?- MVI  ?- RD and SLP following ?- Will advance diet as able ?- Continue MiraLAX once daily ?- Anticipate Phos will self-correct with feeding ? ?Hypertension, improved ?BP much improved 160s/90s yesterday to 120s/60s this morning with addition of Coreg yesterday.  ?- Continue Coreg 12.5mg  BID ? ?Normocytic Anemia ?Stable. Hgb 11.1. Suspect multifactorial given etOH abuse and likely iron deficiency 2/2 poor PO.  ?- Can get an anemia panel tomorrow if still here to better characterize ? ?Paroxysmal A Fib, now persistent ?Irregular rhythm to my exam today but rate-controlled. Off amio gtt yesterday. K 3.9, Mag 1.9. ?- Continue Coreg for rate control ?- On Lovenox, transition back to Eliquis today ?- ECG to confirm persistence of A fib ?- Replete electrolytes to K>4, Mag>2.  ? ?BPH  Overflow incontinence ?Follows with urology OP. Prominent prostate on CT.  ?- Remove foley ?- Transition bethanechol to home Flomax ? ?Hypokalemia, resolved ?GI losses have slowed. K 3.9 this am. ?- monitor ? ?Stable medical conditions ?Renal calculi- can follow up OP with established urologist ?Stage I pressure ulcer- present on admission ? ?Resolved medical conditions ?RLE Edema, neg DVT US ?Rhabdomyolsis, CK and Cr WNL ?Suspected aspiriation PNA, s/p abx SORA ? ? ?FEN/GI: Dysphagia 3, advance per SLP ?PPx: On Eliquis ?Dispo:SNF tomorrow. Barriers include psych clearance.  ? ?Subjective:  ?Nicholas Estes reports feeling generally well this morning.  Much improved from yesterday.  He is not having any abdominal pain at this time.  Denies any nausea either.  Denies any active or passive suicidal ideation.  He feels that he would benefit from SNF placement for rehab as he does feel weaker than usual and has noticed a general decline in  his functional status in recent months. ? ?Objective: ?Temp:  [98.1 ?F (36.7 ?C)-98.6 ?F (37 ?C)] 98.1 ?F (36.7 ?C) (04/20 0304) ?Pulse Rate:   [80-110] 92 (04/20 0304) ?Resp:  [13-29] 20 (04/20 0304) ?BP: (111-184)/(64-110) 128/64 (04/20 0304) ?SpO2:  [91 %-100 %] 91 % (04/20 0304) ?Weight:  [105 kg] 105 kg (04/20 0417) ?Physical Exam: ?General: Awake and alert, in good spirits ?Cardiovascular: Irregularly irregular, no murmurs ?Respiratory: Normal work of breathing on room air, lungs clear ?Abdomen: Abdomen is mildly distended but much improved compared to previous exams, nontender to palpation and without mass or organomegaly ?Extremities: Right lower extremity edema much improved from yesterday, without warmth, erythema, streaking ? ?Laboratory: ?Recent Labs  ?Lab 11/04/21 ?0311 11/05/21 ?0058 11/06/21 ?[id number]  ?WBC 7.9 7.6 7.0  ?HGB 13.0 12.2* 11.1*  ?HCT 37.7* 34.7* 32.1*  ?PLT 229 271 287  ? ?Recent Labs  ?Lab 11/04/21 ?0311 11/05/21 ?0058 11/06/21 ?[id number]  ?NA 137 136 136  ?K 3.1* 3.0* 3.9  ?CL 103 99 105  ?CO2 23 25 23   ?BUN 8 5* 5*  ?CREATININE 0.86 0.73 0.73  ?CALCIUM 8.2* 8.1* 8.0*  ?GLUCOSE 90 101* 96  ? ? ?Imaging/Diagnostic Tests: ?VAS US LOWER EXTREMITY VENOUS (DVT) ? Lower Venous DVT Study ? ?Patient Name:  Nicholas Estes  Date of Exam:   11/05/2021 ?Medical Rec #: [id number]      Accession #:    [id number] ?Date of Birth: 09-28-1960     Patient Gender: M ?Patient Age:   [age] years ?[facility] ?Procedure:      VAS US LOWER EXTREMITY VENOUS (DVT) ?Referring Phys: Juanetta SnowKORONKWO OGAN ? ?-------------------------------------------------------------------------------- ?  ?Indications: Swelling, and Edema. ?  ?Comparison Study: 11/04/19 prior ? ?Performing Technologist: Argentina PonderMegan Stricklin RVS ? ?  ?Examination Guidelines: ?A complete evaluation includes B-mode imaging, spectral Doppler, color Doppler, ?and power Doppler as needed of all accessible portions of each vessel. Bilateral ?testing is considered an integral part of a complete examination. Limited ?examinations for reoccurring indications may be performed as noted. The  reflux ?portion of the exam is performed with the patient in reverse Trendelenburg. ? ?  ? ?+---------+---------------+---------+-----------+----------+--------------+ ?RIGHT    CompressibilityPhasicitySpontaneityPropertiesThrombus Aging ?+---------+---------------+---------+-----------+----------+--------------+ ?CFV      Full           Yes      Yes                                 ?+---------+---------------+---------+-----------+----------+--------------+ ?SFJ      Full                                                        ?+---------+---------------+---------+-----------+----------+--------------+ ?FV Prox  Full                                                        ?+---------+---------------+---------+-----------+----------+--------------+ ?FV Mid   Full                                                        ?+---------+---------------+---------+-----------+----------+--------------+ ?  FV DistalFull           Yes      Yes                                 ?+---------+---------------+---------+-----------+----------+--------------+ ?PFV      Full                                                        ?+---------+---------------+---------+-----------+----------+--------------+ ?POP      Full           Yes      Yes                                 ?+---------+---------------+---------+-----------+----------+--------------+ ?PTV      Full                                                        ?+---------+---------------+---------+-----------+----------+--------------+ ?PERO     Full                                                        ?+---------+---------------+---------+-----------+----------+--------------+ ? ?  ? ?  ? ?+----+---------------+---------+-----------+----------+--------------+ ?LEFTCompressibilityPhasicitySpontaneityPropertiesThrombus Aging ?+----+---------------+---------+-----------+----------+--------------+ ?CFV Full            Yes      Yes                                 ?+----+---------------+---------+-----------+----------+--------------+ ? ?  ? ?  ? ?  ? ?  ?Summary: ?RIGHT: ?- There is no evidence of deep vein thrombosis in the lower extremity. ?  ?- No cystic structure found in the popliteal fossa. ?

## 2021-11-06 NOTE — Progress Notes (Signed)
Speech Language Pathology Treatment: Dysphagia  ?Patient Details ?Name: Nicholas Estes ?MRN: [id number] ?DOB: July 10, 1961 ?Today's Date: 11/06/2021 ?Time: 2947-6546 ?SLP Time Calculation (min) (ACUTE ONLY): 15 min ? ?Assessment / Plan / Recommendation ?Clinical Impression ? Pt was seen for dysphagia treatment. He was alert and cooperative during the session. Large bore NGT has been removed. He stated that all he would like to have is some pizza and chocolate cake. He tolerated regular texture solids, mixed consistency boluses and thin liquids via straw using consecutive swallows without overt s/sx of aspiration. Mastication and oral clearance were WFL. Pt's diet will be advanced to regular texture solids and thin liquids. SLP will follow briefly to ensure tolerance of the advanced diet, but it is anticipated that d/c from SLP services is imminent.  ?  ?HPI HPI: 61 year old with psychiatry history admitted with altered mental status, MRI showing remote right occipital infarct , EEG negative for seizures , intubated 4/12-16. Pt has a history of mild dysaphgia following CVA in 2021. MBS showed mild residue and impaired timing, no penetration or aspiration. DYs 3/thin recommended with supervision given AMS. ?  ?   ?SLP Plan ? Continue with current plan of care ? ?  ?  ?Recommendations for follow up therapy are one component of a multi-disciplinary discharge planning process, led by the attending physician.  Recommendations may be updated based on patient status, additional functional criteria and insurance authorization. ?  ? ?Recommendations  ?Diet recommendations: Regular;Thin liquid ?Liquids provided via: Straw ?Medication Administration: Whole meds with liquid ?Supervision: Staff to assist with self feeding ?Compensations: Minimize environmental distractions;Slow rate ?Postural Changes and/or Swallow Maneuvers: Seated upright 90 degrees  ?   ?    ?   ? ? ? ? Oral Care Recommendations: Oral care QID ?Follow Up  Recommendations: No SLP follow up ?Assistance recommended at discharge: Other (comment) ?SLP Visit Diagnosis: Dysphagia, unspecified (R13.10) ?Plan: Continue with current plan of care ? ? ? ? ?  ?  ?Calob Baskette [name], MS, CCC-SLP ?Acute Rehabilitation Services ?Office number [phone] ?Pager [phone] ? ? ?[name] ? ?11/06/2021, 3:03 PM ? ? ?  ?

## 2021-11-06 NOTE — Progress Notes (Signed)
FPTS Brief Note ?Reviewed patient's vitals, recent notes.  ?Vitals:  ? 11/06/21 1549 11/06/21 2000  ?BP: 132/87 (!) 140/91  ?Pulse: 94   ?Resp: 19   ?Temp: 97.7 ?F (36.5 ?C) 98.4 ?F (36.9 ?C)  ?SpO2: 95%   ? ?At this time, no change in plan from day progress note.  ?Alfredo Martinez, MD ?Page [phone] with questions about this patient.  ? ? ?

## 2021-11-06 NOTE — NC FL2 (Signed)
?[place] MEDICAID FL2 LEVEL OF CARE SCREENING TOOL  ?  ? ?IDENTIFICATION  ?Patient Name: ?Nicholas Estes Birthdate: 12-20-1960 Sex: male Admission Date (Current Location): ?10/28/2021  ?[place] and [place] Number: ? [place] ?  Facility and Address:  ?[facility] [place] [address], [place], [place] [address] ?     Provider Number: ?   ?Attending Physician Name and Address:  ?Kinnie Feil, MD ? Relative Name and Phone Number:  ?  ?   ?Current Level of Care: ?Hospital Recommended Level of Care: ?[place] Prior Approval Number: ?  ? ?Date Approved/Denied: ?  PASRR Number: ?[id number] ? ?Discharge Plan: ?SNF ?  ? ?Current Diagnoses: ?Patient Active Problem List  ? Diagnosis Date Noted  ? Pressure injury of skin 10/29/2021  ? Encephalopathy 10/28/2021  ? Alcohol withdrawal ([place] [place]) 10/28/2021  ? Left-sided weakness   ? Slurred speech   ? Oropharyngeal dysphagia   ? Stroke (cerebrum) ([place]) 11/04/2019  ? Stroke [facility]) 11/04/2019  ? ? ?Orientation RESPIRATION BLADDER Height & Weight   ?  ?Self, Place ? Normal External catheter, Incontinent Weight: 231 lb 7.7 oz (105 kg) ?Height:  5\' 11"  (180.3 cm)  ?BEHAVIORAL SYMPTOMS/MOOD NEUROLOGICAL BOWEL NUTRITION STATUS  ?    Continent Diet (please see discharge summary)  ?AMBULATORY STATUS COMMUNICATION OF NEEDS Skin   ?Limited Assist Verbally Other (Comment) (pressure injury coccyx left stage I) ?  ?  ?  ?    ?     ?     ? ? ?Personal Care Assistance Level of Assistance  ?Bathing, Feeding, Dressing Bathing Assistance: Limited assistance ?Feeding assistance: Independent ?Dressing Assistance: Limited assistance ?   ? ?Functional Limitations Info  ?Hearing, Speech, Sight Sight Info: Adequate ?Hearing Info: Adequate ?Speech Info: Adequate  ? ? ?SPECIAL CARE FACTORS FREQUENCY  ?PT (By licensed PT), OT (By licensed OT)   ?  ?PT Frequency: 5x per week ?OT Frequency: 5x per week ?  ?  ?  ?   ? ? ?Contractures Contractures Info: Not present   ? ? ?Additional Factors Info  ?Code Status, Allergies Code Status Info: FULL ?Allergies Info: NKA ?  ?  ?  ?   ? ?Current Medications (11/06/2021):  This is the current hospital active medication list ?Current Facility-Administered Medications  ?Medication Dose Route Frequency Provider Last Rate Last Admin  ? 0.9 %  sodium chloride infusion   Intravenous PRN Eppie Gibson, MD      ? acetaminophen (TYLENOL) tablet 650 mg  650 mg Oral Q6H PRN Hammons, Kimberly B, RPH      ? apixaban (ELIQUIS) tablet 5 mg  5 mg Oral BID Jim Like B, MD   5 mg at 11/06/21 1132  ? carvedilol (COREG) tablet 12.5 mg  12.5 mg Oral BID WC Andrena Mews T, MD   12.5 mg at 11/06/21 1132  ? chlorhexidine (PERIDEX) 0.12 % solution 15 mL  15 mL Mouth Rinse BID Olalere, Adewale A, MD   15 mL at 11/04/21 2125  ? Chlorhexidine Gluconate Cloth 2 % PADS 6 each  6 each Topical Q0600 Etheleen Nicks, MD   6 each at 11/06/21 1134  ? feeding supplement (BOOST / RESOURCE BREEZE) liquid 1 Container  1 Container Oral TID BM Lenoria Chime, MD   1 Container at [id number]  ? folic acid (FOLVITE) tablet 1 mg  1 mg Oral Daily Andrena Mews T, MD   1 mg at 11/06/21 1132  ? MEDLINE  mouth rinse  15 mL Mouth Rinse q12n4p Olalere, Adewale A, MD   15 mL at 11/04/21 1619  ? metoprolol tartrate (LOPRESSOR) injection 2.5-5 mg  2.5-5 mg Intravenous Q3H PRN Rigoberto Noel, MD      ? multivitamin with minerals tablet 1 tablet  1 tablet Oral Daily Kinnie Feil, MD   1 tablet at 11/06/21 1132  ? Oxcarbazepine (TRILEPTAL) tablet 300 mg  300 mg Oral BID Andrena Mews T, MD   300 mg at 11/06/21 1132  ? polyethylene glycol (MIRALAX / GLYCOLAX) packet 17 g  17 g Oral Daily Andrena Mews T, MD      ? simethicone (MYLICON) chewable tablet 80 mg  80 mg Oral Q6H PRN Hammons, Kimberly B, RPH      ? tamsulosin (FLOMAX) capsule 0.4 mg  0.4 mg Oral Daily Jim Like B, MD   0.4 mg at 11/06/21 1132  ? thiamine tablet 100 mg  100 mg Oral Daily Andrena Mews T,  MD   100 mg at 11/06/21 1132  ? ? ? ?Discharge Medications: ?Please see discharge summary for a list of discharge medications. ? ?Relevant Imaging Results: ? ?Relevant Lab Results: ? ? ?Additional Information ?SSN [id number] ? ?Vinie Sill, LCSW ? ? ? ? ?

## 2021-11-06 NOTE — Evaluation (Signed)
Occupational Therapy Evaluation ?Patient Details ?Name: Nicholas Estes ?MRN: [id number] ?DOB: 1961-02-15 ?Today's Date: 11/06/2021 ? ? ?History of Present Illness 61 year old Xadmitted 4/11 with altered mental status, found to have acute metabolic encephalopathy and aspiration PNA. MRI showing remote right occipital infarct , EEG negative for seizures , intubated/ 4/12-16. PMH: A-fib, bilateral CVA, and conversion disorder with pseudoseizures  ? ?Clinical Impression ?  ?Patient admitted for the diagnosis above.  PTA he was able to mobilize short distances with a RW, and did need assist with bathing and dressing from a PCA.  Deficits are listed below.  OT to follow in the acute setting, but SNF for post acute rehab is recommended, as he does not have the 24 hour Max A at home, to ensure a safe transition.  Unfortunately, the patient cannot come up with the needed assist to make home with [facility] possible.    ?   ? ?Recommendations for follow up therapy are one component of a multi-disciplinary discharge planning process, led by the attending physician.  Recommendations may be updated based on patient status, additional functional criteria and insurance authorization.  ? ?Follow Up Recommendations ? Skilled nursing-short term rehab (<3 hours/day)  ?  ?Assistance Recommended at Discharge Frequent or constant Supervision/Assistance  ?Patient can return home with the following Two people to help with walking and/or transfers;Assistance with feeding;Help with stairs or ramp for entrance;Assist for transportation;Assistance with cooking/housework;Direct supervision/assist for financial management;Direct supervision/assist for medications management;A lot of help with bathing/dressing/bathroom ? ?  ?Functional Status Assessment ? Patient has had a recent decline in their functional status and demonstrates the ability to make significant improvements in function in a reasonable and predictable amount of time.  ?Equipment  Recommendations ? Wheelchair (measurements OT);Wheelchair cushion (measurements OT)  ?  ?Recommendations for Other Services   ? ? ?  ?Precautions / Restrictions Precautions ?Precautions: Fall ?Restrictions ?Weight Bearing Restrictions: No  ? ?  ? ?Mobility Bed Mobility ?Overal bed mobility: Needs Assistance ?Bed Mobility: Supine to Sit ?  ?  ?Supine to sit: Mod assist, HOB elevated ?  ?  ?  ?  ? ?Transfers ?Overall transfer level: Needs assistance ?  ?Transfers: Bed to chair/wheelchair/BSC ?  ?  ?Squat pivot transfers: Max assist ?  ?  ?  ?General transfer comment: needs increased time to process movements, poor sitting balance. ?  ? ?  ?Balance Overall balance assessment: Needs assistance ?Sitting-balance support: Feet supported ?Sitting balance-Leahy Scale: Poor ?  ?  ?  ?  ?  ?  ?  ?  ?  ?  ?  ?  ?  ?  ?  ?  ?   ? ?ADL either performed or assessed with clinical judgement  ? ?ADL Overall ADL's : Needs assistance/impaired ?Eating/Feeding: Set up;Sitting ?  ?Grooming: Wash/dry hands;Wash/dry face;Minimal assistance;Sitting ?  ?Upper Body Bathing: Moderate assistance;Sitting ?  ?Lower Body Bathing: Maximal assistance;Bed level ?  ?Upper Body Dressing : Moderate assistance;Sitting ?  ?Lower Body Dressing: Maximal assistance;Bed level ?  ?Toilet Transfer: Maximal assistance;Squat-pivot ?  ?  ?  ?  ?  ?  ?   ? ? ? ?Vision Ability to See in Adequate Light: 2 Moderately impaired ?Patient Visual Report: No change from baseline ?   ?   ?Perception Perception ?Perception: Within Functional Limits ?  ?Praxis Praxis ?Praxis: Intact ?  ? ?Pertinent Vitals/Pain Pain Assessment ?Pain Assessment: Faces ?Faces Pain Scale: No hurt ?Pain Intervention(s): Monitored during session  ? ? ? ?Hand Dominance Right ?  ?  Extremity/Trunk Assessment Upper Extremity Assessment ?Upper Extremity Assessment: RUE deficits/detail ?RUE Deficits / Details: 3+/3 elbow distal, can grip and touch his nose.  2/5 to shoulder complex. ?RUE Sensation:  decreased light touch ?RUE Coordination: decreased fine motor;decreased gross motor ?LUE Deficits / Details: Grossly WFL's ?  ?Lower Extremity Assessment ?Lower Extremity Assessment: Defer to PT evaluation ?  ?Cervical / Trunk Assessment ?Cervical / Trunk Assessment: Other exceptions ?Cervical / Trunk Exceptions: body habitus ?  ?Communication Communication ?Communication: Expressive difficulties ?  ?Cognition Arousal/Alertness: Awake/alert ?Behavior During Therapy: Restless, Flat affect ?Overall Cognitive Status: Difficult to assess ?Area of Impairment: Orientation, Problem solving, Awareness, Safety/judgement, Memory, Attention ?  ?  ?  ?  ?  ?  ?  ?  ?Orientation Level: Person, Place, Time ?Current Attention Level: Sustained ?Memory: Decreased short-term memory ?  ?  ?Awareness: Emergent ?Problem Solving: Slow processing, Decreased initiation, Difficulty sequencing, Requires verbal cues, Requires tactile cues ?  ?  ?  ?General Comments   VSS ? ?  ?Exercises   ?  ?Shoulder Instructions    ? ? ?Home Living Family/patient expects to be discharged to:: Private residence ?Living Arrangements: Non-relatives/Friends ?Available Help at Discharge: Friend(s);Available PRN/intermittently;Other (Comment) ?Type of Home: House ?Home Access: Ramped entrance ?  ?  ?Home Layout: One level ?  ?  ?Bathroom Shower/Tub: Walk-in shower ?  ?Bathroom Toilet: Standard ?Bathroom Accessibility: Yes ?  ?Home Equipment: Conservation officer, nature (2 wheels);Cane - single point;BSC/3in1;Shower seat;Grab bars - tub/shower;Hand held shower head;Wheelchair - manual ?  ?  ?  ? ?  ?Prior Functioning/Environment Prior Level of Function : Needs assist ? Cognitive Assist : Mobility (cognitive);ADLs (cognitive) ?Mobility (Cognitive): Set up cues ?ADLs (Cognitive): Intermittent cues ?Physical Assist : Mobility (physical);ADLs (physical) ?Mobility (physical): Gait;Stairs ?ADLs (physical): IADLs;Bathing;Dressing;Toileting ?Mobility Comments: reports ambulates up to  50 feet with RW, greater distances require wheelchair, however just recently he was able to take some steps with a cane ?ADLs Comments: CNAs assist with ADLs, and iADLs ?  ? ?  ?  ?OT Problem List: Decreased strength;Decreased activity tolerance;Impaired balance (sitting and/or standing);Impaired vision/perception;Decreased safety awareness;Decreased cognition;Impaired sensation;Impaired UE functional use ?  ?   ?OT Treatment/Interventions: Self-care/ADL training;Therapeutic activities;Patient/family education;Balance training;DME and/or AE instruction  ?  ?OT Goals(Current goals can be found in the care plan section) Acute Rehab OT Goals ?OT Goal Formulation: Patient unable to participate in goal setting ?Time For Goal Achievement: 11/20/21 ?Potential to Achieve Goals: Good ?ADL Goals ?Pt Will Perform Grooming: with set-up;sitting ?Pt Will Perform Upper Body Bathing: with set-up;sitting ?Pt Will Perform Upper Body Dressing: with supervision;sitting ?Pt Will Perform Lower Body Dressing: with mod assist;sitting/lateral leans ?Pt Will Transfer to Toilet: with min assist;squat pivot transfer;bedside commode  ?OT Frequency: Min 2X/week ?  ? ?Co-evaluation   ?  ?  ?  ?  ? ?  ?AM-PAC OT "6 Clicks" Daily Activity     ?Outcome Measure Help from another person eating meals?: A Little ?Help from another person taking care of personal grooming?: A Little ?Help from another person toileting, which includes using toliet, bedpan, or urinal?: A Lot ?Help from another person bathing (including washing, rinsing, drying)?: A Lot ?Help from another person to put on and taking off regular upper body clothing?: A Lot ?Help from another person to put on and taking off regular lower body clothing?: A Lot ?6 Click Score: 14 ?  ?End of Session Nurse Communication: Mobility status ? ?Activity Tolerance: Patient tolerated treatment well ?Patient left: in chair;with call bell/phone  within reach;with chair alarm set ? ?OT Visit Diagnosis:  Unsteadiness on feet (R26.81);Other abnormalities of gait and mobility (R26.89);Muscle weakness (generalized) (M62.81);History of falling (Z91.81);Other symptoms and signs involving cognitive function;Hemiplegia and hemipare

## 2021-11-06 NOTE — Progress Notes (Signed)
Called [facility] [place] [phone], ext [phone] voice message to return call.   ? ?Isla Pence MD- Dr Jeri Lager ? ?TOC will continue to follow and assist with discharge planning. If patient remains agreeable to SNF placement- he will need approval from the [place] and bed offer that is [place] with the [place]  [place] is very lengthy. Patient is more than likely be here over the weekend and next week.  ? ? ?Antony Blackbird, MSW, LCSW ?Clinical Social Worker ? ? ?

## 2021-11-06 NOTE — Progress Notes (Signed)
Mobility Specialist: Progress Note ? ? 11/06/21 1502  ?Mobility  ?Activity  ?(Stood from Medical illustrator)  ?Level of Assistance Maximum assist, patient does 25-49%  ?Assistive Device  ?(HHA)  ?Activity Response Tolerated well  ?$Mobility charge 1 Mobility  ? ?Went in the room to check on pt as he was shouting in the hallway for help. Requesting to be adjusted in the chair. Pt stood x2 heavy modA-maxA HHA. Increased trunk flexion when standing. Pt back in the chair with call bell in reach and chair alarm on.  ? ?Cristal Deer Kazoua Gossen ?Mobility Specialist ?Mobility Specialist 5 North: [phone] ?Mobility Specialist 6 North: [phone] ? ?

## 2021-11-07 DIAGNOSIS — G9340 Encephalopathy, unspecified: Secondary | ICD-10-CM | POA: Diagnosis not present

## 2021-11-07 LAB — IRON AND TIBC
Iron: 56 ug/dL (ref 45–182)
Saturation Ratios: 29 % (ref 17.9–39.5)
TIBC: 190 ug/dL — ABNORMAL LOW (ref 250–450)
UIBC: 134 ug/dL

## 2021-11-07 LAB — BASIC METABOLIC PANEL
Anion gap: 6 (ref 5–15)
BUN: <5 mg/dL — ABNORMAL LOW (ref 8–23)
CO2: 26 mmol/L (ref 22–32)
Calcium: 8.3 mg/dL — ABNORMAL LOW (ref 8.9–10.3)
Chloride: 105 mmol/L (ref 98–111)
Creatinine, Ser: 0.67 mg/dL (ref 0.61–1.24)
GFR, Estimated: >60 mL/min (ref >60)
Glucose, Bld: 102 mg/dL — ABNORMAL HIGH (ref 70–99)
Potassium: 3.9 mmol/L (ref 3.5–5.1)
Sodium: 137 mmol/L (ref 135–145)

## 2021-11-07 LAB — RETICULOCYTES
Immature Retic Fract: 28.4 % — ABNORMAL HIGH (ref 2.3–15.9)
RBC.: 3.66 MIL/uL — ABNORMAL LOW (ref 4.22–5.81)
Retic Count, Absolute: 57.1 10*3/uL (ref 19.0–186.0)
Retic Ct Pct: 1.6 % (ref 0.4–3.1)

## 2021-11-07 LAB — VITAMIN B12: Vitamin B-12: 424 pg/mL (ref 180–914)

## 2021-11-07 LAB — MAGNESIUM: Magnesium: 2.1 mg/dL (ref 1.7–2.4)

## 2021-11-07 LAB — FOLATE: Folate: 9.5 ng/mL (ref >5.9)

## 2021-11-07 LAB — GLUCOSE, CAPILLARY: Glucose-Capillary: 114 mg/dL — ABNORMAL HIGH (ref 70–99)

## 2021-11-07 LAB — FERRITIN: Ferritin: 221 ng/mL (ref 24–336)

## 2021-11-07 LAB — PHOSPHORUS: Phosphorus: 3.1 mg/dL (ref 2.5–4.6)

## 2021-11-07 MED ORDER — POTASSIUM CHLORIDE 20 MEQ PO PACK
20.0000 meq | PACK | Freq: Once | ORAL | Status: AC
  Administered 2021-11-07: 20 meq via ORAL
  Filled 2021-11-07: qty 1

## 2021-11-07 NOTE — Progress Notes (Signed)
Speech Language Pathology Treatment: Dysphagia  ?Patient Details ?Name: Nicholas Estes ?MRN: [id number] ?DOB: May 04, 1961 ?Today's Date: 11/07/2021 ?Time: 4144-3601 ?SLP Time Calculation (min) (ACUTE ONLY): 11 min ? ?Assessment / Plan / Recommendation ?Clinical Impression ? Pt was seen for dysphagia treatment and was cooperative throughout the session. Pt, and nursing reported that the pt has been tolerating the current diet without overt s/sx of aspiration. Pt tolerated regular texture solids, mixed consistency boluses (i.e., pears with juice) and thin liquids via cup and straw using individual and consecutive swallows without symptoms of oropharyngeal dysphagia. It is recommended that the current diet be continued. Further skilled SLP services are not clinically indicated at this time.  ?  ?HPI HPI: 61 year old with psychiatry history admitted with altered mental status, MRI showing remote right occipital infarct , EEG negative for seizures , intubated 4/12-16. Pt has a history of mild dysaphgia following CVA in 2021. MBS showed mild residue and impaired timing, no penetration or aspiration. DYs 3/thin recommended with supervision given AMS. ?  ?   ?SLP Plan ? All goals met;Discharge SLP treatment due to (comment) ? ?  ?  ?Recommendations for follow up therapy are one component of a multi-disciplinary discharge planning process, led by the attending physician.  Recommendations may be updated based on patient status, additional functional criteria and insurance authorization. ?  ? ?Recommendations  ?Diet recommendations: Regular;Thin liquid ?Liquids provided via: Straw ?Medication Administration: Whole meds with liquid ?Supervision: Staff to assist with self feeding ?Compensations: Slow rate ?Postural Changes and/or Swallow Maneuvers: Seated upright 90 degrees  ?   ?    ?   ? ? ? ? Oral Care Recommendations: Oral care QID ?Follow Up Recommendations: No SLP follow up ?Assistance recommended at discharge: Other  (comment) ?SLP Visit Diagnosis: Dysphagia, unspecified (R13.10) ?Plan: All goals met;Discharge SLP treatment due to (comment) ? ? ? ? ?  ?  ?Daveah Varone I. Hardin Negus, [place], CCC-SLP ?Acute Rehabilitation Services ?Office number [phone] ?Pager [phone] ? ? ?Horton Marshall ? ?11/07/2021, 12:34 PM ? ? ? ?

## 2021-11-07 NOTE — TOC Progression Note (Signed)
Transition of Care (TOC) - Progression Note  ? ? ?Patient Details  ?Name: Nicholas Estes ?MRN: [id number] ?Date of Birth: 05/28/1961 ? ?Transition of Care (TOC) CM/SW Contact  ?Eduard Roux, LCSW ?Phone Number: ?11/07/2021, 11:25 AM ? ?Clinical Narrative:    ? ?Per chart review patient is disoriented to situation- CSW contacted patient's son,Mike [phone]. He requested CSW to call back after 4pm.  ? ?CSW spoke with [facility] Memo # [phone] ext [phone]- she confirmed patient has been approved for [facility] aide 11 hrs per week, but unable to confirm the schedule. She verifies address and reports contact person listed their charts is a friend Percell Belt [phone]. ? ? ? ?Expected Discharge Plan: Skilled Nursing Facility ?Barriers to Discharge: English as a second language teacher, SNF Pending bed offer (requires [facility] approval) ? ?Expected Discharge Plan and Services ?Expected Discharge Plan: Skilled Nursing Facility ?In-house Referral: Clinical Social Work ?  ?  ?Living arrangements for the past 2 months: Single Family Home ?                ?  ?  ?  ?  ?  ?  ?  ?  ?  ?  ? ? ?Social Determinants of Health (SDOH) Interventions ?  ? ?Readmission Risk Interventions ?   ? View : No data to display.  ?  ?  ?  ? ? ?

## 2021-11-07 NOTE — Progress Notes (Signed)
Mobility Specialist: Progress Note ? ? 11/07/21 1227  ?Mobility  ?Activity Ambulated with assistance in room  ?Level of Assistance Minimal assist, patient does 75% or more  ?Assistive Device Front wheel walker  ?Distance Ambulated (ft) 70 ft  ?Activity Response Tolerated well  ?$Mobility charge 1 Mobility  ? ?Pt received in the chair and agreeable to ambulation. C/o R hip pain during, otherwise asymptomatic. Posterior LOB x2 during session when turning, minA to correct. Pt back to chair after session with call bell at his side. Chair alarm is on.  ? ?Cristal Deer Ebb Carelock ?Mobility Specialist ?Mobility Specialist 5 North: [phone] ?Mobility Specialist 6 North: [phone] ? ?

## 2021-11-07 NOTE — Progress Notes (Signed)
FPTS Brief Note ?Reviewed patient's vitals, recent notes.  ?Vitals:  ? 11/07/21 1553 11/07/21 1933  ?BP: (!) 117/92 103/70  ?Pulse: 100 100  ?Resp: 17   ?Temp: 98.3 ?F (36.8 ?C) 98.3 ?F (36.8 ?C)  ?SpO2: 95% 94%  ? ?At this time, no change in plan from day progress note.  ?Erskine Emery, MD ?Page [phone] with questions about this patient.  ? ? ?

## 2021-11-07 NOTE — Consult Note (Signed)
Brief Psychiatry Consult Note  ?Saw pt for last full consult note 4/21. Since that time, have discussed case with Dr. Lurline Hare (IM service) who spoke to the friend who found him at the scene at time of intake - pt had spilled all of his medications 2 days prior to hospital admission leading to empty pill bottles found next to pt prior to admission (less concerning for suicide than initial story). Had put friend's # in EMR but it has since been deleted. Patient continues to deny SI, HI, AH/VH; states he "has so many things he wants to get done in life" and proceeds to list some of them. Had a wake up call with episode of delirium last night (thought he was at home) and has been much more involved with PT so he can "get out of here as soon as it is safe to get out". Denied HA, CP, SOB, GI distress. Sleeping better each day, appetite adequate. At this point, pt has denied suicidal ideations x3 days, have spoken to son who is not aware of any prior suicide attempts. Although it is impossible to know exactly what happened prior to admission given that pt himself does not remember, it appears unlikely at this time that this was a suicide attempt. Patient additionally is agreeable to a safe dc plan (SNF).  ?  ? ?- no further medication changes ?- psychiatry signing off; does not require psychiatric hospitalization at this time.  ? ?We will sign off at this time. This has been communicated to the primary team. If issues arise in the future, don't hesitate to reconsult the Psychiatry Inpatient Consult Service.  ? ? ?I personally spent 15 minutes on the unit in direct patient care. The direct patient care time included face-to-face time with the patient, reviewing the patient's chart, communicating with other professionals, and coordinating care. Greater than 50% of this time was spent in counseling or coordinating care with the patient regarding goals of hospitalization, psycho-education, and discharge planning  needs. ? ? ?Lee-Ann Gal A Terriah Reggio ? ?

## 2021-11-07 NOTE — Progress Notes (Addendum)
Family Medicine Teaching Service ?Daily Progress Note ?Intern Pager: [phone] ? ?Patient name: Nicholas Estes Medical record number: [id number] ?Date of birth: May 09, 1961 Age: 61 y.o. Gender: male ? ?Primary Care Provider: [facility] ?Consultants: PCCM, psychiatry ?Code Status: Full ? ?Pt Overview and Major Events to Date:  ?4/11- admitted, acutely agitated ?4/11- transferred to ICU ?4/12- intubated ?4/16- extubated ?4/20- transferred back to FPTS ?  ? ?Assessment and Plan: ?Nicholas Estes is a 61 y.o. male who presented with altered mental status of unknown etiology, now s/p ICU stay requiring intubation for agitated delirium and with concern for ileus vs SBO. PMH is significant for alcohol abuse, tremors, afib on eliquis, stroke, and conversion disorder. Patient is now medically stable for discharge to SNF ? ?Acute Metabolic Encephalopathy with Agitated Delirium, Resolved ?Previous suicidal thoughts ?History of substance abuse (THC and etOH) ?Patient remains pleasant and oriented. Seen by psychiatry yesterday who have cleared him from their point of view for discharge to SNF with outpatient psychiatric follow-up. We are currently giving him less Trileptal than he was taking at home, but his pain seems to be well-controlled. ?- Continue Trileptal at 300mg  BID ?- TOC assisting with SNF Placement ?- PT/OT while still here ? ?Ileus, resolved  Dyspagia s/p extubation, improved ?Inadequate Oral Intake  Hypophosphatemia, resolved ?Did well with removal of flexiseal and foley yesterday. Voiding and stooling spontaneously and without issue. Able to eat and drink, advanced to regular diet yesterday. Phos WNL at 3.1 today, improved with PO intake.  ?- POAL ?- Boost breeze TID ?- MVI ?- RD and SLP following ?- MiraLAX daily ? ?Normocytic anemia, stable ?Anemia panel this am with normal ferritin, B12, folate. TIBC is low, suggesting secondary to hypoproteinemia, consistent with malnutrition.  ?- monitor ? ?HTN ?BP  at goal, 113/75 this am.  ?- Continue Coreg 12.5mg  BID ? ?Persistent A fib  Prolonged QTc ?Confirmed on ECG yesterday. Rate controlled. QTc 500. K 3.9, Mag 2.1. ?- Replete to K>4, Mag >2.1 ?- Continue Eliquis ?- Coreg 12.5 BID ?- Remains stable for SNF, can d/c cardiac monitors ? ?   ?Stable medical conditions ?Renal calculi- can follow up OP with established urologist ?Stage I pressure ulcer- present on admission ?  ?Resolved medical conditions ?RLE Edema, neg DVT ?Rhabdomyolsis, CK and Cr WNL ?Suspected aspiriation PNA, s/p abx SORA ?Hypokalemia- monitor ? ?FEN/GI: Regular  ?PPx: On Eliquis ?Dispo:SNF in 2-3 days. Barriers include insurance approval and bed offer.  ? ?Subjective:  ?Mr. Hanauer denies any complaints today. He has been able to urinate and pass bowel movements without issue.  ? ?Objective: ?Temp:  [97.7 ?F (36.5 ?C)-98.5 ?F (36.9 ?C)] 97.7 ?F (36.5 ?C) (04/21 0302) ?Pulse Rate:  [89-94] 89 (04/21 0302) ?Resp:  [17-20] 20 (04/21 0302) ?BP: (112-154)/(62-96) 113/75 (04/21 0302) ?SpO2:  [95 %-100 %] 95 % (04/21 0302) ?Physical Exam: ?General: Awake, alert, in good spirits and NAD ?Cardiovascular: Irregularly irregular, no murmur ?Respiratory: Normal WOB on RA, lungs clear throughout ?Abdomen: Mild distention, no  tenderness ?Extremities: 1+ pedal edema ? ?Laboratory: ?Recent Labs  ?Lab 11/04/21 ?0311 11/05/21 ?0058 11/06/21 ?11/08/21  ?WBC 7.9 7.6 7.0  ?HGB 13.0 12.2* 11.1*  ?HCT 37.7* 34.7* 32.1*  ?PLT 229 271 287  ? ?Recent Labs  ?Lab 11/05/21 ?0058 11/06/21 ?11/08/21 11/07/21 ?0324  ?NA 136 136 137  ?K 3.0* 3.9 3.9  ?CL 99 105 105  ?CO2 25 23 26   ?BUN 5* 5* <5*  ?CREATININE 0.73 0.73 0.67  ?CALCIUM 8.1* 8.0* 8.3*  ?  GLUCOSE 101* 96 102*  ? ? ?Imaging/Diagnostic Tests: ?No new imaging, tests ? ?Alicia Amel, MD ?11/07/2021, 6:34 AM ?PGY-1, [facility] ?FPTS Intern pager: [phone], text pages welcome ? ?

## 2021-11-07 NOTE — TOC Progression Note (Signed)
Transition of Care (TOC) - Progression Note  ? ? ?Patient Details  ?Name: Nicholas Estes ?MRN: [id number] ?Date of Birth: 1961-06-25 ? ?Transition of Care (TOC) CM/SW Contact  ?Eduard Roux, LCSW ?Phone Number: ?11/07/2021, 10:27 AM ? ?Clinical Narrative:    ? ?04/20-CSW sent referral for SNF approval to [facility]  ? ?04/21-received confirmation of fax  ? ?CSW awaiting determination form the VA for SNF placement.  ?CS will continue to follow and assist with discharge planning. ? ?Antony Blackbird, MSW, LCSW ?Clinical Social Worker ? ? ? ? ?Expected Discharge Plan: Skilled Nursing Facility ?Barriers to Discharge: English as a second language teacher, SNF Pending bed offer (requires VA approval) ? ?Expected Discharge Plan and Services ?Expected Discharge Plan: Skilled Nursing Facility ?In-house Referral: Clinical Social Work ?  ?  ?Living arrangements for the past 2 months: Single Family Home ?                ?  ?  ?  ?  ?  ?  ?  ?  ?  ?  ? ? ?Social Determinants of Health (SDOH) Interventions ?  ? ?Readmission Risk Interventions ?   ? View : No data to display.  ?  ?  ?  ? ? ?

## 2021-11-07 NOTE — Progress Notes (Signed)
Physical Therapy Treatment ?Patient Details ?Name: Nicholas Estes ?MRN: [id number] ?DOB: Apr 13, 1961 ?Today's Date: 11/07/2021 ? ? ?History of Present Illness 61 year old male admitted 4/11 with altered mental status, found to have acute metabolic encephalopathy and aspiration PNA. MRI showing remote right occipital infarct , EEG negative for seizures , intubated/ 4/12-16. PMH: A-fib, bilateral CVA, and conversion disorder with pseudoseizures ? ?  ?PT Comments  ? ? Pt tolerates treatment well, progressing to ambulation this session. Pt continues to demonstrate LE weakness resulting in continued assistance requirements during transfers, as well as instability with ambulation. Pt will benefit from aggressive mobilization in an effort to reduce falls risk and restore independence. PT continues to recommend SNF placement at this time.   ?Recommendations for follow up therapy are one component of a multi-disciplinary discharge planning process, led by the attending physician.  Recommendations may be updated based on patient status, additional functional criteria and insurance authorization. ? ?Follow Up Recommendations ? Skilled nursing-short term rehab (<3 hours/day) ?  ?  ?Assistance Recommended at Discharge Frequent or constant Supervision/Assistance  ?Patient can return home with the following Two people to help with walking and/or transfers;Two people to help with bathing/dressing/bathroom;Assistance with cooking/housework;Direct supervision/assist for medications management;Assistance with feeding;Direct supervision/assist for financial management;Assist for transportation;Help with stairs or ramp for entrance ?  ?Equipment Recommendations ? None recommended by PT  ?  ?Recommendations for Other Services   ? ? ?  ?Precautions / Restrictions Precautions ?Precautions: Fall ?Precaution Comments: long history of falling, ?Restrictions ?Weight Bearing Restrictions: No  ?  ? ?Mobility ? Bed Mobility ?Overal bed mobility:  Needs Assistance ?Bed Mobility: Supine to Sit ?  ?  ?Supine to sit: Min guard, HOB elevated ?  ?  ?  ?  ? ?Transfers ?Overall transfer level: Needs assistance ?Equipment used: Rolling walker (2 wheels) ?Transfers: Bed to chair/wheelchair/BSC, Sit to/from Stand ?Sit to Stand: Mod assist ?  ?Step pivot transfers: Mod assist ?  ?  ?  ?General transfer comment: cues for increased trunk flexion and to scoot to edge of bed ?  ? ?Ambulation/Gait ?Ambulation/Gait assistance: Min assist ?Gait Distance (Feet): 30 Feet (additional trial of 10') ?Assistive device: Rolling walker (2 wheels) ?Gait Pattern/deviations: Step-to pattern ?Gait velocity: reduced ?Gait velocity interpretation: <1.31 ft/sec, indicative of household ambulator ?  ?General Gait Details: pt with slowed step-to gait reduced foot clearance of R foot ? ? ?Stairs ?  ?  ?  ?  ?  ? ? ?Wheelchair Mobility ?  ? ?Modified Rankin (Stroke Patients Only) ?  ? ? ?  ?Balance Overall balance assessment: Needs assistance ?Sitting-balance support: No upper extremity supported, Feet supported ?Sitting balance-Leahy Scale: Good ?  ?  ?Standing balance support: Bilateral upper extremity supported, Reliant on assistive device for balance ?Standing balance-Leahy Scale: Poor ?  ?  ?  ?  ?  ?  ?  ?  ?  ?  ?  ?  ?  ? ?  ?Cognition Arousal/Alertness: Awake/alert ?Behavior During Therapy: [facility] for tasks assessed/performed ?Overall Cognitive Status: Impaired/Different from baseline ?Area of Impairment: Memory, Awareness, Problem solving ?  ?  ?  ?  ?  ?  ?  ?  ?  ?  ?Memory: Decreased short-term memory ?  ?  ?Awareness: Emergent ?Problem Solving: Slow processing, Requires verbal cues ?  ?  ?  ? ?  ?Exercises General Exercises - Lower Extremity ?Ankle Circles/Pumps: AROM, Both, 5 reps ?Long Arc Quad: AROM, Both, 5 reps ?Hip ABduction/ADduction: AROM, Both, 5 reps ?  Hip Flexion/Marching: AROM, Both, 5 reps ? ?  ?General Comments General comments (skin integrity, edema, etc.): VSS on RA ?   ?  ? ?Pertinent Vitals/Pain Pain Assessment ?Pain Assessment: No/denies pain  ? ? ?Home Living   ?  ?  ?  ?  ?  ?  ?  ?  ?  ?   ?  ?Prior Function    ?  ?  ?   ? ?PT Goals (current goals can now be found in the care plan section) Acute Rehab PT Goals ?Patient Stated Goal: get back to walking with cane ?Progress towards PT goals: Progressing toward goals ? ?  ?Frequency ? ? ? Min 2X/week ? ? ? ?  ?PT Plan Current plan remains appropriate  ? ? ?Co-evaluation   ?  ?  ?  ?  ? ?  ?AM-PAC PT "6 Clicks" Mobility   ?Outcome Measure ? Help needed turning from your back to your side while in a flat bed without using bedrails?: A Little ?Help needed moving from lying on your back to sitting on the side of a flat bed without using bedrails?: A Little ?Help needed moving to and from a bed to a chair (including a wheelchair)?: A Lot ?Help needed standing up from a chair using your arms (e.g., wheelchair or bedside chair)?: A Lot ?Help needed to walk in hospital room?: A Little ?Help needed climbing 3-5 steps with a railing? : Total ?6 Click Score: 14 ? ?  ?End of Session   ?Activity Tolerance: Patient tolerated treatment well ?Patient left: in chair;with call bell/phone within reach;with chair alarm set ?Nurse Communication: Mobility status ?PT Visit Diagnosis: Unsteadiness on feet (R26.81);Other abnormalities of gait and mobility (R26.89);Repeated falls (R29.6);Muscle weakness (generalized) (M62.81);History of falling (Z91.81);Difficulty in walking, not elsewhere classified (R26.2);Other symptoms and signs involving the nervous system (R29.898);Pain ?  ? ? ?Time: [id number] ?PT Time Calculation (min) (ACUTE ONLY): 24 min ? ?Charges:  $Gait Training: 8-22 mins ?$Therapeutic Activity: 8-22 mins          ?          ? ?[name], PT, DPT ?Acute Rehabilitation ?Pager: [phone] ?Office [phone] ? ? ? ?[name] ?11/07/2021, 9:58 AM ? ?

## 2021-11-08 DIAGNOSIS — I10 Essential (primary) hypertension: Secondary | ICD-10-CM | POA: Diagnosis not present

## 2021-11-08 DIAGNOSIS — I480 Paroxysmal atrial fibrillation: Secondary | ICD-10-CM

## 2021-11-08 DIAGNOSIS — D649 Anemia, unspecified: Secondary | ICD-10-CM | POA: Diagnosis not present

## 2021-11-08 NOTE — Progress Notes (Signed)
Family Medicine Teaching Service ?Daily Progress Note ?Intern Pager: [phone] ? ?Patient name: Nicholas Estes Medical record number: [id number] ?Date of birth: 11/02/1960 Age: 61 y.o. Gender: male ? ?Primary Care Provider: [facility] ?Consultants: PCCM, psychiatry ?  Code Status: Full Code ? ?Pt Overview and Major Events to Date:  ?4/11- admitted, acutely agitated ?4/11- transferred to ICU ?4/12- intubated ?4/16- extubated ?4/20- transferred back to FPTS ?4/21- medically stable for SNF ? ?Assessment and Plan: ?Janis Loach is a 61 y.o. male who presented with acute encephalopathy of unclear etiology requiring ICU level of care for intubation now resolved. PMHx significant for: Alcohol abuse, tremors, atrial fibrillation on apixaban, CVA, conversion disorder. Patient is now medically stable for discharge to SNF ? ?Acute encephalopathy, resolved ?Psychology signed off yesterday, will not require psychiatric hospitalization.  Plan for SNF. ?- continue oxcarbazepine 300 mg twice daily (decreased from prior home dose) ?- TOC ?- PT/OT ? ?Inadequate oral intake ?Patient has been refusing feeding supplement. ?- Boost Breeze TID ?- MVI ?- regular diet ?- RD, SLP ? ?Normocytic anemia ?- monitor ? ?HTN ?At goal. ?- continue carvedilol 12.5 mg twice daily ? ?Persistent atrial fibrillation ?Rate controlled. ?- continue apixaban ?- BB ?- goal K>4, Mg>2 ? ?QT prolongation ?QTc 500 on EKG 4/20. ?- avoid QT prolonging medications ? ?Stable medical conditions ?Renal calculi- can follow up OP with established urologist ?Stage I pressure ulcer- present on admission ?  ?Resolved medical conditions ?RLE Edema, neg DVT US ?Rhabdomyolsis, CK and Cr WNL ?Suspected aspiriation PNA, s/p abx SORA ?Hypokalemia- monitor ?Ileus ?Hypophosphatemia ? ?FEN/GI: Regular diet ?PPx: Full dose anticoagulation with apixaban ? ?Disposition: SNF when bed available ? ?Subjective:  ?NAOE.  Patient states he did not sleep well overnight.  Otherwise  no concerns and denies any pain currently.  1 stool documented overnight. ? ?Objective: ?Temp:  [97.7 ?F (36.5 ?C)-98.9 ?F (37.2 ?C)] 98 ?F (36.7 ?C) (04/22 0304) ?Pulse Rate:  [74-100] 74 (04/22 0304) ?Resp:  [16-18] 17 (04/21 1553) ?BP: (99-147)/(69-92) 140/84 (04/22 0304) ?SpO2:  [91 %-96 %] 93 % (04/22 0304) ?Physical Exam: ?General: Alert, NAD ?Cardiovascular: Irregularly irregular, normal rate ?Respiratory: Clear to auscultation bilaterally, no respiratory distress ?Abdomen: Soft, nontender ?Extremities: Warm and well perfused, no edema ? ?Laboratory: ?Recent Labs  ?Lab 11/04/21 ?0311 11/05/21 ?0058 11/06/21 ?[id number]  ?WBC 7.9 7.6 7.0  ?HGB 13.0 12.2* 11.1*  ?HCT 37.7* 34.7* 32.1*  ?PLT 229 271 287  ? ?Recent Labs  ?Lab 11/05/21 ?0058 11/06/21 ?[id number] 11/07/21 ?0324  ?NA 136 136 137  ?K 3.0* 3.9 3.9  ?CL 99 105 105  ?CO2 25 23 26   ?BUN 5* 5* <5*  ?CREATININE 0.73 0.73 0.67  ?CALCIUM 8.1* 8.0* 8.3*  ?GLUCOSE 101* 96 102*  ? ? ? ? ?Imaging/Diagnostic Tests: ?No results found.  ? ?Zola Button, MD ?11/08/2021, 6:07 AM ?PGY-2, [place] ?[place] Intern pager: [phone], text pages welcome  ?

## 2021-11-08 NOTE — TOC Progression Note (Signed)
Transition of Care (TOC) - Progression Note  ? ? ?Patient Details  ?Name: Nicholas Estes ?MRN: [id number] ?Date of Birth: 1961/05/24 ? ?Transition of Care (TOC) CM/SW Contact  ?Vinie Sill, LCSW ?Phone Number: ?11/08/2021, 9:54 AM ? ?Clinical Narrative:    ? ?Received call from [place] w/ [place] screening committee -[facility] approved for 30 days [employer]- once a SNF has offerd - TOC will need to contact Camella Kipp [facility] [phone]. ? ?CSW met with patient. CSW introduced self and explained role. CSW confirmed with patient he remains agreeable to short term rehab at [facility] CSW advised, [facility]. CSW explained, now will need a SNF that is network and has availability and that may not be in the local area. Patient states understanding.  ? ?CSW inquired  about his support system. Patient states he has four brothers and 2 sisters. They live up [place]. He shared they are not close. He expressed his only support in his home is his [facility] aide and Roxi. He states Roxi  has left but hopes she will return after they clear up an misunderstanding. Patient states he does not remember who was with him prior to coming to hospital or who called the ambulance.  ? ?Patient confirmed he has a [place] aide for 11 hrs per week.  Mon-Wed 3 hrs and 2 hrs on Thurs. ? ?Sent referral to  ?[facility] placement on hold ?[facility] -unable to offer ?[place]  ?[facility]  ?[facility] ?[facility] ?[place] ?[place] ?[facility] ?[place]  ? ?TOC will continue to follow and assist with discharge planning. ? ?Thurmond Butts, MSW, LCSW ?Clinical Social Worker ? ? ?  ?  ? ? ? ? ? ?Expected Discharge Plan: [place] ?Barriers to Discharge: Ship broker, SNF Pending bed offer (requires [facility] approval) ? ?Expected Discharge Plan and Services ?Expected Discharge Plan: [place] ?In-house Referral: Clinical Social Work ?  ?  ?Living  arrangements for the past 2 months: [place] ?                ?  ?  ?  ?  ?  ?  ?  ?  ?  ?  ? ? ?Social Determinants of Health (SDOH) Interventions ?  ? ?Readmission Risk Interventions ?   ? View : No data to display.  ?  ?  ?  ? ? ?

## 2021-11-08 NOTE — TOC Progression Note (Signed)
Transition of Care (TOC) - Progression Note  ? ? ?Patient Details  ?Name: Nicholas Estes ?MRN: [id number] ?Date of Birth: [birth year] ? ?Transition of Care (TOC) CM/SW Contact  ?Windle Guard, LCSW ?Phone Number: ?11/08/2021, 8:42 AM ? ?Clinical Narrative:    ?CSW is following for SNF placement. At this time no current bed offers. Will continue to follow.  ? ? ?Expected Discharge Plan: Skilled Nursing Facility ?Barriers to Discharge: English as a second language teacher, SNF Pending bed offer (requires VA approval) ? ?Expected Discharge Plan and Services ?Expected Discharge Plan: Skilled Nursing Facility ?In-house Referral: Clinical Social Work ?  ?  ?Living arrangements for the past 2 months: Single Family Home ?                ?  ?  ?  ?  ?  ?  ?  ?  ?  ?  ? ? ?Social Determinants of Health (SDOH) Interventions ?  ? ?Readmission Risk Interventions ?   ? View : No data to display.  ?  ?  ?  ? ? ?

## 2021-11-09 DIAGNOSIS — G9340 Encephalopathy, unspecified: Secondary | ICD-10-CM | POA: Diagnosis not present

## 2021-11-09 DIAGNOSIS — I480 Paroxysmal atrial fibrillation: Secondary | ICD-10-CM | POA: Diagnosis not present

## 2021-11-09 DIAGNOSIS — I10 Essential (primary) hypertension: Secondary | ICD-10-CM | POA: Diagnosis not present

## 2021-11-09 LAB — BASIC METABOLIC PANEL
Anion gap: 6 (ref 5–15)
BUN: <5 mg/dL — ABNORMAL LOW (ref 8–23)
CO2: 25 mmol/L (ref 22–32)
Calcium: 8.3 mg/dL — ABNORMAL LOW (ref 8.9–10.3)
Chloride: 105 mmol/L (ref 98–111)
Creatinine, Ser: 0.71 mg/dL (ref 0.61–1.24)
GFR, Estimated: >60 mL/min (ref >60)
Glucose, Bld: 99 mg/dL (ref 70–99)
Potassium: 4.1 mmol/L (ref 3.5–5.1)
Sodium: 136 mmol/L (ref 135–145)

## 2021-11-09 LAB — MAGNESIUM: Magnesium: 2 mg/dL (ref 1.7–2.4)

## 2021-11-09 LAB — PHOSPHORUS: Phosphorus: 3.6 mg/dL (ref 2.5–4.6)

## 2021-11-09 NOTE — Progress Notes (Signed)
Family Medicine Teaching Service ?Daily Progress Note ?Intern Pager: [phone] ? ?Patient name: Nicholas Estes Medical record number: [id number] ?Date of birth: Dec 18, 1960 Age: 61 y.o. Gender: male ? ?Primary Care Provider: [facility] ?Consultants: None ?Code Status: Full code ? ?Pt Overview and Major Events to Date:  ?4/11- admitted, acutely agitated ?4/11- transferred to ICU ?4/12- intubated ?4/16- extubated ?4/20- transferred back to FPTS ? ?Assessment and Plan: ?Nicholas Estes is a 61 year old who presented with encephalopathy of unclear etiology require ICU level care with intubation as since resolved.  Past medical history significant for alcohol abuse, tremors, atrial fibrillation (Eliquis), CVA and conversion disorder.  Patient medically stable and appropriate for discharge to skilled nursing facility as recommended. ? ?Acute encephalopathy, resolved ?Patient was evaluated by psychology who has since signed off ?-Continue ox carbamazepine 300 mg twice daily ? ? ?Decreased oral intake ?Patient previously recommended for feeding supplements.  Patient has declined this on several occasions. ?-Continue multivitamin ?-Regular diet ?-Appreciate recommendations by registered dietitian and speech therapy ? ? ?Hypertension ?Blood pressure normotensive overnight.  ?- continue hm BP med Coreg 12.5 mg BID ? ? ?Persistent atrial fibrillation ?Heart rate ranged from 80s-90s overnight  ?-Continue home Eliquis ?-Continue home beta-blocker ? ? ?FEN/GI: Regular diet ?PPx: Continue home Eliquis ?Dispo:SNF in 2-3 days. Barriers include SNF placement.  ? ?Subjective:  ?Patient reports some frustration with not being able to connect with individuals who have access to his home. He denies pain or dyspnea. No medical complaints at this time.  ? ?Objective: ?Temp:  [98.2 ?F (36.8 ?C)-98.5 ?F (36.9 ?C)] 98.2 ?F (36.8 ?C) (04/23 [id number]) ?Pulse Rate:  [77-123] 93 (04/23 0312) ?Resp:  [16-20] 18 (04/22 1454) ?BP: (98-145)/(65-90)  131/84 (04/23 [id number]) ?SpO2:  [91 %-99 %] 93 % (04/23 0312) ?Weight:  [104.3 kg] 104.3 kg (04/23 0500) ? ?Physical Exam: ?General: NAD, lying in bed ?Cardiovascular: RRR ?Respiratory: no respiratory distress, comfortable on RA with normal WOB  ?Abdomen: no tenderness, BS present throughout  ?Extremities: no LE pitting edema  ? ?Laboratory: ?Recent Labs  ?Lab 11/04/21 ?0311 11/05/21 ?0058 11/06/21 ?[id number]  ?WBC 7.9 7.6 7.0  ?HGB 13.0 12.2* 11.1*  ?HCT 37.7* 34.7* 32.1*  ?PLT 229 271 287  ? ?Recent Labs  ?Lab 11/06/21 ?[id number] 11/07/21 ?[id number] 11/09/21 ?0323  ?NA 136 137 136  ?K 3.9 3.9 4.1  ?CL 105 105 105  ?CO2 23 26 25   ?BUN 5* <5* <5*  ?CREATININE 0.73 0.67 0.71  ?CALCIUM 8.0* 8.3* 8.3*  ?GLUCOSE 96 102* 99  ? ? ? ?Imaging/Diagnostic Tests: ?No new imaging ? ? ?Eulis Foster, MD ?11/09/2021, 7:14 AM ?PGY-3, [place] ?[place] Intern pager: [phone], text pages welcome ? ?

## 2021-11-09 NOTE — TOC Progression Note (Signed)
Transition of Care (TOC) - Progression Note  ? ? ?Patient Details  ?Name: Nicholas Estes ?MRN: [id number] ?Date of Birth: July 27, 1960 ? ?Transition of Care (TOC) CM/SW Contact  ?Windle Guard, LCSW ?Phone Number: ?11/09/2021, 9:11 AM ? ?Clinical Narrative:    ?CSW is following for SNF placement. At this time no current bed offers. Will continue to follow.  ? ? ?Expected Discharge Plan: Skilled Nursing Facility ?Barriers to Discharge: English as a second language teacher, SNF Pending bed offer (requires VA approval) ? ?Expected Discharge Plan and Services ?Expected Discharge Plan: Skilled Nursing Facility ?In-house Referral: Clinical Social Work ?  ?  ?Living arrangements for the past 2 months: Single Family Home ?                ?  ?  ?  ?  ?  ?  ?  ?  ?  ?  ? ? ?Social Determinants of Health (SDOH) Interventions ?  ? ?Readmission Risk Interventions ?   ? View : No data to display.  ?  ?  ?  ? ? ?

## 2021-11-10 DIAGNOSIS — I480 Paroxysmal atrial fibrillation: Secondary | ICD-10-CM | POA: Diagnosis not present

## 2021-11-10 DIAGNOSIS — R4182 Altered mental status, unspecified: Secondary | ICD-10-CM | POA: Diagnosis not present

## 2021-11-10 NOTE — Progress Notes (Signed)
Mobility Specialist: Progress Note ? ? 11/10/21 1619  ?Mobility  ?Activity Ambulated with assistance in hallway  ?Level of Assistance Contact guard assist, steadying assist  ?Assistive Device Front wheel walker  ?Distance Ambulated (ft) 756 ft  ?Activity Response Tolerated well  ?$Mobility charge 1 Mobility  ? ?Received pt in chair having no complaints and agreeable to mobility. Asymptomatic throughout ambulation, returned back to chair w/ call bell in reach and all needs met. ? ?Harrell Gave Lavanda Nevels ?Mobility Specialist ?Mobility Specialist [place]: [phone] ?Mobility Specialist [place]: [phone] ? ?

## 2021-11-10 NOTE — Progress Notes (Signed)
FPTS Brief Note ?Reviewed patient's vitals, recent notes.  ?Vitals:  ? 11/09/21 2310 11/10/21 0321  ?BP: 121/77 (!) 141/85  ?Pulse: 81 96  ?Resp:    ?Temp: 98.4 ?F (36.9 ?C) 98 ?F (36.7 ?C)  ?SpO2: 93% 95%  ? ?At this time, no change in plan from day progress note.  ?France Ravens, MD ?Page [phone] with questions about this patient.  ? ?

## 2021-11-10 NOTE — Progress Notes (Signed)
Occupational Therapy Treatment ?Patient Details ?Name: Nicholas Estes ?MRN: [id number] ?DOB: Aug 06, 1960 ?Today's Date: 11/10/2021 ? ? ?History of present illness 61 year old male admitted 4/11 with altered mental status, found to have acute metabolic encephalopathy and aspiration PNA. MRI showing remote right occipital infarct , EEG negative for seizures , intubated/ 4/12-16. PMH: A-fib, bilateral CVA, and conversion disorder with pseudoseizures ?  ?OT comments ? Patient continues to make steady progress towards goals in skilled OT session. Patient's session encompassed  further assessment of cognition and sit<>stands to increase activity tolerance. Patient with increased mentation and improved anticipatory awareness (actively making phone calls to achieve 24 hour support), however unable to state what he would do if there was a fire at the house. Patient able to state he would get out of the house, but unsure what to do next and unable to answer until OT stated, "who do you have to call?" Patient does not manage his own medications (completed by CNAs who come 4 days a week for 4 hours) and his girlfriend either makes and portions out his meals or completes microwave dinners. Pending improvement and 24 hour support, patient could be able to go home and have [place] services. Recommendation staying at [facility] pending further progress. OT will continue to follow.   ? ?Recommendations for follow up therapy are one component of a multi-disciplinary discharge planning process, led by the attending physician.  Recommendations may be updated based on patient status, additional functional criteria and insurance authorization. ?   ?Follow Up Recommendations ? Skilled nursing-short term rehab (<3 hours/day) (Home Health pending progress)  ?  ?Assistance Recommended at Discharge    ?Patient can return home with the following ? A little help with walking and/or transfers;A little help with bathing/dressing/bathroom;Assistance with  cooking/housework;Direct supervision/assist for medications management;Direct supervision/assist for financial management;Assist for transportation;Help with stairs or ramp for entrance ?  ?Equipment Recommendations ? None recommended by OT  ?  ?Recommendations for Other Services   ? ?  ?Precautions / Restrictions Precautions ?Precautions: Fall ?Precaution Comments: long history of falling, ?Restrictions ?Weight Bearing Restrictions: No  ? ? ?  ? ?Mobility Bed Mobility ?  ?  ?  ?  ?  ?  ?  ?General bed mobility comments: Up in chair upon arrival ?  ? ?Transfers ?Overall transfer level: Needs assistance ?Equipment used: Rolling walker (2 wheels) ?Transfers: Sit to/from Stand ?Sit to Stand: Min guard ?  ?  ?  ?  ?  ?General transfer comment: Able to complete 5 sit<>stands from recliner without phyiscal assist, min gaurd for safety ?  ?  ?Balance Overall balance assessment: Needs assistance ?Sitting-balance support: No upper extremity supported, Feet supported ?Sitting balance-Leahy Scale: Good ?  ?  ?Standing balance support: Bilateral upper extremity supported, Reliant on assistive device for balance ?Standing balance-Leahy Scale: Poor ?  ?  ?  ?  ?  ?  ?  ?  ?  ?  ?  ?  ?   ? ?ADL either performed or assessed with clinical judgement  ? ?ADL Overall ADL's : Needs assistance/impaired ?  ?  ?  ?  ?  ?  ?  ?  ?  ?  ?  ?  ?Toilet Transfer: Min guard;BSC/3in1;Stand-pivot ?Toilet Transfer Details (indicate cue type and reason): simulate by ability to complete 5 sit<>stands from recliner with RW ?  ?  ?  ?  ?Functional mobility during ADLs: Minimal assistance;Cueing for sequencing;Rolling walker (2 wheels);Cueing for safety ?General ADL Comments: Patient continuing  to progress towards goals ?  ? ?Extremity/Trunk Assessment   ?  ?  ?  ?  ?  ? ?Vision   ?  ?  ?Perception   ?  ?Praxis   ?  ? ?Cognition Arousal/Alertness: Awake/alert ?Behavior During Therapy: [facility] for tasks assessed/performed ?Overall Cognitive Status:  Impaired/Different from baseline ?Area of Impairment: Problem solving, Awareness ?  ?  ?  ?  ?  ?  ?  ?  ?  ?  ?  ?  ?  ?  ?Problem Solving: Requires verbal cues ?General Comments: patient with increased mentation and improved anticipatory awareness (actively making phone calls to achieve 24 hour support), however unable to state what he would do if there was a fire at the house. patient able to state he would get out of the house, but unsure what to do next and unable to answer until OT stated, "who do you have to call?" ?  ?  ?   ?Exercises   ? ?  ?Shoulder Instructions   ? ? ?  ?General Comments    ? ? ?Pertinent Vitals/ Pain       Pain Assessment ?Pain Assessment: No/denies pain ? ?Home Living   ?  ?  ?  ?  ?  ?  ?  ?  ?  ?  ?  ?  ?  ?  ?  ?  ?  ?  ? ?  ?Prior Functioning/Environment    ?  ?  ?  ?   ? ?Frequency ? Min 2X/week  ? ? ? ? ?  ?Progress Toward Goals ? ?OT Goals(current goals can now be found in the care plan section) ? Progress towards OT goals: Progressing toward goals ? ?Acute Rehab OT Goals ?OT Goal Formulation: With patient ?Time For Goal Achievement: 11/20/21 ?Potential to Achieve Goals: Good  ?Plan Discharge plan remains appropriate   ? ?Co-evaluation ? ? ?   ?  ?  ?  ?  ? ?  ?AM-PAC OT "6 Clicks" Daily Activity     ?Outcome Measure ? ? Help from another person eating meals?: None ?Help from another person taking care of personal grooming?: A Little ?Help from another person toileting, which includes using toliet, bedpan, or urinal?: A Little ?Help from another person bathing (including washing, rinsing, drying)?: A Little ?Help from another person to put on and taking off regular upper body clothing?: A Little ?Help from another person to put on and taking off regular lower body clothing?: A Lot ?6 Click Score: 18 ? ?  ?End of Session Equipment Utilized During Treatment: Rolling walker (2 wheels) ? ?OT Visit Diagnosis: Unsteadiness on feet (R26.81);Other abnormalities of gait and mobility  (R26.89);Muscle weakness (generalized) (M62.81);History of falling (Z91.81);Other symptoms and signs involving cognitive function;Hemiplegia and hemiparesis;Low vision, both eyes (H54.2) ?Hemiplegia - Right/Left: Right ?Hemiplegia - dominant/non-dominant: Dominant ?Hemiplegia - caused by: Cerebral infarction ?  ?Activity Tolerance Patient tolerated treatment well ?  ?Patient Left in chair;with call bell/phone within reach ?  ?Nurse Communication Mobility status ?  ? ?   ? ?Time: [id number] ?OT Time Calculation (min): 18 min ? ?Charges: OT General Charges ?$OT Visit: 1 Visit ?OT Treatments ?$Self Care/Home Management : 8-22 mins ? ?Corinne Ports E. Krolak, OTR/L ?Acute Rehabilitation Services ?[phone] ?[phone]  ? ?Corinne Ports Rounds ?11/10/2021, 2:54 PM ?

## 2021-11-10 NOTE — TOC Progression Note (Signed)
Transition of Care (TOC) - Progression Note  ? ? ?Patient Details  ?Name: Nicholas Estes ?MRN: [id number] ?Date of Birth: 09/18/1960 ? ?Transition of Care (TOC) CM/SW Contact  ? N , LCSW ?Phone Number: ?11/10/2021, 2:11 PM ? ?Clinical Narrative:    ? ?CSW reviewed chart, per patient he wants to go home. CSW met with patient. He confirmed he no longer wants to pursue short term rehab at SNF. Patient states his preference is to go home. He states he has a aide MTW 9-11 and Thr 9-11 and he private pay for aide on Fridays. He expressed "I think I will be emotionally better off".  He excited he is progressing and ready to return home. ? ?CSW offered SA/MH resources. Patient states he is working with the [facility] to establish additional support and perhaps joining a support group. Patient states he is still deciding but was agreeable to accept VA crisis line information and local VA help line.  ? ?RN and CM updated.  ? ? , MSW, LCSW ?Clinical Social Worker ? ? ? ?Expected Discharge Plan: Skilled Nursing Facility ?Barriers to Discharge: Insurance Authorization, SNF Pending bed offer (requires VA approval) ? ?Expected Discharge Plan and Services ?Expected Discharge Plan: Skilled Nursing Facility ?In-house Referral: Clinical Social Work ?  ?  ?Living arrangements for the past 2 months: Single Family Home ?                ?  ?  ?  ?  ?  ?  ?  ?  ?  ?  ? ? ?Social Determinants of Health (SDOH) Interventions ?  ? ?Readmission Risk Interventions ?   ? View : No data to display.  ?  ?  ?  ? ? ?

## 2021-11-10 NOTE — Progress Notes (Signed)
Mobility Specialist: Progress Note ? ? 11/10/21 1159  ?Mobility  ?Activity Ambulated with assistance in hallway  ?Level of Assistance Minimal assist, patient does 75% or more  ?Assistive Device Front wheel walker  ?Distance Ambulated (ft) 470 ft  ?Activity Response Tolerated well  ?$Mobility charge 1 Mobility  ? ?Pt received in the chair and agreeable to ambulation. Pt expressed frustration with SNF recommendation stating he wants to go home. Explained to pt that recommendation was made based on progress and encouraged pt to push himself during sessions. Verbal cues for RW management during as pt would drift to the L throughout session. C/o R knee and calf discomfort during session as well. To BR after session, BM successful, then back to chair after walk with call bell and phone at his side.  ? ?Nicholas Estes ?Mobility Specialist ?Mobility Specialist [place] [address]: [phone] ?Mobility Specialist [address]: [phone] ? ?

## 2021-11-10 NOTE — Progress Notes (Signed)
FPTS Brief Note ?Reviewed patient's vitals, recent notes.  ?Vitals:  ? 11/10/21 1245 11/10/21 1935  ?BP: (!) 149/79 (!) 144/84  ?Pulse: 89 86  ?Resp: 20 (!) 22  ?Temp:  98.1 ?F (36.7 ?C)  ?SpO2: 98% 95%  ? ?At this time, no change in plan from day progress note.  ?Patient does not want to pursue SNF and wants to return home with home health. May be able to go home with [facility] if continues to progress ? ?Park Pope, MD ?Page [phone] with questions about this patient.  ? ?

## 2021-11-10 NOTE — Progress Notes (Addendum)
Family Medicine Teaching Service ?Daily Progress Note ?Intern Pager: [phone] ? ?Patient name: Nicholas Estes Medical record number: [id number] ?Date of birth: May 21, 1961 Age: 61 y.o. Gender: male ? ?Primary Care Provider: [facility] ?Consultants: None ?Code Status: Full Code ? ?Pt Overview and Major Events to Date:  ?4/11- admitted, acutely agitated ?4/11- transferred to ICU ?4/12- intubated ?4/16- extubated ?4/20- transferred back to FPTS ?  ?Assessment and Plan: ?Nicholas Estes is a 61 year old who presented with encephalopathy of unclear etiology require ICU level care with intubation as since resolved.  Past medical history significant for alcohol abuse, tremors, atrial fibrillation (Eliquis), CVA and conversion disorder.  Patient medically stable and appropriate for discharge to skilled nursing facility as recommended. ? ?Acute encephalopathy, resolved ?Awake and alert, expresses a desire to go home. Weak to standing. Will need greater strength before a dc home would be safe. ?- Continue Oxcarbazepine 300mg  BID ?- PT/OT continue to work with patient ?- OOB ?- Bedside commode ordered, remove purewick  ?- Fall precautions ? ? Decreased PO Intake, resolved ?Now eating and drinking at baseline. ?- Continue MVI ?- Reg diet ?- Repeat electrolytes on Friday if still here ? ?HTN ?BP is normotensive. ?- Continue Coreg 12.5mg  BID ? ?Persistent A Fib ?HR remains within normal limits. ?- Continue home Eliquis ?- Continue Coreg as above ? ?Pressure Injury of Skin ?Sacral, Stage I. Has a dressing in place. Present on admission.  ? ?FEN/GI: Regular ?PPx: On eliquis ?Dispo:SNF today. Barriers include placement.  ? ?Subjective:  ?Nicholas Estes reports feeling well this morning.  His only complaint at this time is that he would very much like to go home.  He feels that he is strong enough to go home but after standing with me at bedside acknowledges that he will need to gain some strength. ? ?Objective: ?Temp:  [98 ?F  (36.7 ?C)-98.6 ?F (37 ?C)] 98.6 ?F (37 ?C) (04/24 0725) ?Pulse Rate:  [75-96] 81 (04/24 0725) ?Resp:  [18-20] 20 (04/24 0725) ?BP: (120-141)/(75-89) 135/83 (04/24 0725) ?SpO2:  [92 %-98 %] 98 % (04/24 0725) ?Physical Exam: ?General: Awake and alert, NAD ?Cardiovascular: Irregularly irregular, no murmur ?Respiratory: Normal WOB on RA ?Abdomen: Obese, non-tender, non-distended ?Extremities: Without edema, clubbing noted stable from baseline ?Neuro: Able to stand at bedside, 2-week ? ?Laboratory: ?No new labs ? ?Imaging/Diagnostic Tests: ?No new imaging, tests ? ?06-09-2006, MD ?11/10/2021, 8:34 AM ?PGY-1, [place] [facility] ?FPTS Intern pager: [phone], text pages welcome ? ?

## 2021-11-11 ENCOUNTER — Other Ambulatory Visit (HOSPITAL_COMMUNITY): Payer: Self-pay

## 2021-11-11 MED ORDER — OXCARBAZEPINE 300 MG PO TABS
300.0000 mg | ORAL_TABLET | Freq: Two times a day (BID) | ORAL | 0 refills | Status: DC
  Filled 2021-11-11: qty 60, 30d supply, fill #0

## 2021-11-11 MED ORDER — OXCARBAZEPINE 300 MG PO TABS
300.0000 mg | ORAL_TABLET | Freq: Two times a day (BID) | ORAL | 0 refills | Status: AC
Start: 2021-11-11 — End: ?

## 2021-11-11 MED ORDER — CARVEDILOL 12.5 MG PO TABS: 12.5000 mg | ORAL_TABLET | Freq: Two times a day (BID) | ORAL | 0 refills | Status: DC

## 2021-11-11 MED ORDER — ROSUVASTATIN CALCIUM 20 MG PO TABS: 20.0000 mg | ORAL_TABLET | Freq: Every day | ORAL | 0 refills | Status: DC

## 2021-11-11 MED ORDER — CARVEDILOL 12.5 MG PO TABS
12.5000 mg | ORAL_TABLET | Freq: Two times a day (BID) | ORAL | 0 refills | Status: DC
  Filled 2021-11-11: qty 60, 30d supply, fill #0

## 2021-11-11 NOTE — Progress Notes (Signed)
Mobility Specialist: Progress Note ? ? 11/11/21 1239  ?Mobility  ?Activity Ambulated with assistance in hallway  ?Level of Assistance Contact guard assist, steadying assist  ?Assistive Device Front wheel walker  ?Distance Ambulated (ft) 550 ft  ?Activity Response Tolerated well  ?$Mobility charge 1 Mobility  ? ?Pt received in chair and agreeable to ambulation. C/o BUE pain from RW vibration, otherwise asymptomatic. Pt back to recliner after session with call bell and phone in reach. Chair alarm is on.  ? ?Cristal Deer Ed Mandich ?Mobility Specialist ?Mobility Specialist [address]: [phone] ?Mobility Specialist [address]: [phone] ? ?

## 2021-11-11 NOTE — Discharge Instructions (Addendum)
Dear Nicholas Estes,  ? ?Thank you so much for allowing [place] to be part of your care!  You were admitted to [facility] for confusion and altered mental status, required short stay in the ICU. Continue with your oxcarbazepine 300 mg twice daily. Please stop your bupropion, your psychiatrist will tell you when it is safe to restart this or if you need to try a different medication.  ? ?Please continue working with PT at home to increase strengthening  ? ?POST-HOSPITAL & CARE INSTRUCTIONS ?Follow up with your PCP and psychiatry  ?Please let PCP/Specialists know of any changes that were made.  ?Please see medications section of this packet for any medication changes.  ? ?DOCTOR'S APPOINTMENT & FOLLOW UP CARE INSTRUCTIONS  ?No future appointments. ? ?RETURN PRECAUTIONS: ? ?Please return if you experience shortness of breath, chest pain, confusion, swelling in the legs, fever  ? ?Take care and be well! ? ?Family Medicine Teaching Service  ?[place]  ?[facility]  ?[address] [place], [place] [address] ?[phone] ? ?

## 2021-11-11 NOTE — Progress Notes (Deleted)
Family Medicine Teaching Service ?Hospital Discharge Summary ? ?Patient name: Nicholas Estes Medical record number: [id number] ?Date of birth: Nov 18, 1960 Age: 61 y.o. Gender: male ?Date of Admission: 10/28/2021  Date of Discharge: 11/11/2021 ?Admitting Physician: Etheleen Nicks, MD ? ?Primary Care Provider: [facility] ?Consultants: PCCM ? ?Indication for Hospitalization: Acute Metabolic Encephalopathy ? ?Discharge Diagnoses/Problem List:  ?Principal Problem: ?  Encephalopathy ?Active Problems: ?  Alcohol withdrawal ([place]) ?  Pressure injury of skin ?  Normocytic anemia ?  Paroxysmal atrial fibrillation (HCC) ?  Hypertension ? ? ? ?Disposition: Home with Home Health ? ?Discharge Condition: Stable ? ?Discharge Exam:  ?From my same-day progress note: ?General: Awake and alert, in good spirits and NAD ?Cardiovascular: Irregularly irregular, without murmur ?Respiratory: Normal work of breathing on room air ?Abdomen: Nontender, nondistended ?Extremities: Without edema ?  ? ?Brief Hospital Course:  ?Nicholas Estes is a 61 y.o.male with a history of alcohol abuse, tremors, A-fib on Eliquis, stroke, conversion disorder, pseudoseizures who was admitted to the [facility] Teaching Service at [facility] for acute encephalopathy. His hospital course is detailed below: ? ?Metabolic encephalopathy with agitated delirium thought to be secondary to overdose on several medication ?Patient presented with altered mental status and thick white emesis.  Patient had waxing and waning mental status with intermittent agitation.  Patient received Narcan x2 with no improvement.  Patient required Haldol to obtain CT head which did not show any acute intracranial abnormalities.  Patient was negative for alcohol, ammonia, Tylenol, and salicylate.  UDS was positive for THC.  Concern of medication overdose was raised given patient being found next to 2 empty bottles of medications.  Poison control was called and recommended supportive care  with electrolyte monitoring.  Neurology was also consulted due to concerns of alcohol withdrawal and possible seizures. Patient was started on continuous EEG and loaded with Keppra. Critical care was consulted due to extreme agitation. Patient was eventually intubated and sedated.  He was extubated on 4/16.   ?The ultimate cause of his symptoms remained unclear, the most likely cause was an overdose of oxcarbazepine and atorvastatin as evidenced by his elevated oxcarbazepine level on admission and his subsequent development of rhabdomyolysis.  However patient denied any suicidal thoughts to psychiatry and was psychiatrically cleared. ?Patient remained quite weak and with limited mobility after the acute phase of his hospitalization. PT/OT recommended SNF but did not receive any bed offers for several days. Through working with PT/OT while in house, he was able to gain enough strength to discharge home with home health rehab services.  ? ?Acute hypoxic/hypercapnic respiratory failure ?Aspiration pneumonia ?Patient became hypoxic with oxygenation saturation dropped down to the mid 80s on 5 L oxygen.  Patient had copious amounts of greenish secretion pooling in the back of his throat and given inability to protect his airway, decision was to be patient as well as undergo bronchoscopy which noted copious amounts of thick creamy secretions all over respiratory tract.  Patient was also started on IV Unasyn and aggressive IV fluids.  He received a total of 4 days of Unasyn before it was discontinued.  He remained comfortable on room air for the remainder of the hospitalization. ?  ?Rhabdomyolysis ?CK peaked 1587 and fluctuated while in the ICU. Received aggressive fluid resuscitation.  With resolution of his CK by 4/18.  This was thought to be secondary to a statin overdose. ? ?Ileus ?Patient had progressive abdominal pain and distention during his hospitalization. CT abdomen on 4/17 and 4/19 showed dilatation  of both the  stomach and the bowel.  There was some concern initially for SBO but the patient was noted to have stool output.  He was decompressed with an NG tube and then started on a soft diet as tolerated.  Patient was amenable to SNF, and was discharged to one in stable condition. ? ? ?PCP Follow-up Recommendations: ?patient endorsed depressed mood secondary to your recent divorce and his recent stroke.  He denied any acute suicidality but would benefit from close outpatient mental health follow-up. We are holding his Welbutrin on discharge due to concern for lowering his seizure threshold. Please reassess the appropriateness of re-starting this medication or changing medication classes.  ?We decreased the dose of the patient's oxcarbazepine to what appears to be the minimal effective dose, 300mg  BID. Please dose adjust as necessary.  ?Patient has arranged 24hour supervision at home with his girlfriend for at least a few days post-discharge. Please ensure that he has the support necessary to continue functioning safely at home.  ? ? ?Significant Procedures: None ? ?Significant Labs and Imaging:  ?Recent Labs  ?Lab 11/05/21 ?0058 11/06/21 ?[id number]  ?WBC 7.6 7.0  ?HGB 12.2* 11.1*  ?HCT 34.7* 32.1*  ?PLT 271 287  ? ?Recent Labs  ?Lab 11/05/21 ?0058 11/06/21 ?[id number] 11/07/21 ?[id number] 11/09/21 ?0323  ?NA 136 136 137 136  ?K 3.0* 3.9 3.9 4.1  ?CL 99 105 105 105  ?CO2 25 23 26 25   ?GLUCOSE 101* 96 102* 99  ?BUN 5* 5* <5* <5*  ?CREATININE 0.73 0.73 0.67 0.71  ?CALCIUM 8.1* 8.0* 8.3* 8.3*  ?MG 2.0 1.9 2.1 2.0  ?PHOS 2.5 2.3* 3.1 3.6  ? ? ?EXAM: ?MRI HEAD WITHOUT CONTRAST ?  ?TECHNIQUE: ?Multiplanar, multiecho pulse sequences of the brain and surrounding ?structures were obtained without intravenous contrast. ?  ?COMPARISON:  CT head without contrast 10/28/2021. MR head without ?contrast 11/05/2019 ?  ?FINDINGS: ?Brain: Remote right occipital lobe infarct noted. Remote blood ?products associated. No acute infarct or hemorrhage is  present. ?Minimal white matter disease is present otherwise. Mild generalized ?atrophy is noted. Ex vacuo dilation of the posterior horn right ?lateral ventricle present. No significant extraaxial fluid ?collection is present. ?  ?Insert normal IC The brainstem and cerebellum are within normal ?limits. ?  ?Vascular: Flow is present in the major intracranial arteries. ?  ?Skull and upper cervical spine: The craniocervical junction is ?normal. Upper cervical spine is within normal limits. Marrow signal ?is unremarkable. ?  ?Sinuses/Orbits: Extensive sinus disease is present. Fluid levels are ?present the maxillary sinuses bilaterally. Fluid is also present in ?the nasopharynx. Mild mucosal thickening is present throughout the ?ethmoid air cells and sphenoid sinuses. Mastoid air cells are clear. ?  ?IMPRESSION: ?1. No acute intracranial abnormality. ?2. Remote right occipital lobe infarct. ?3. Extensive sinus disease. ?  ?  ?Electronically Signed ?  By: San Morelle M.D. ?  On: 10/31/2021 19:45 ? ? ?EXAM: ?CT ABDOMEN AND PELVIS WITHOUT CONTRAST ?  ?TECHNIQUE: ?Multidetector CT imaging of the abdomen and pelvis was performed ?following the standard protocol without IV contrast. ?  ?RADIATION DOSE REDUCTION: This exam was performed according to the ?departmental dose-optimization program which includes automated ?exposure control, adjustment of the mA and/or kV according to ?patient size and/or use of iterative reconstruction technique. ?  ?COMPARISON:  CT with oral contrast only recently 11/03/2021, CTA ?chest, abdomen and pelvis 02/14/2020. ?  ?FINDINGS: ?Lower chest: There are small right-greater-than-left pleural ?effusions again noted with adjacent consolidation or atelectasis in ?  the lower lobes, asymmetric elevation right hemidiaphragm. ?  ?The heart is slightly enlarged. Small pericardial effusion is ?unchanged. ?  ?Hepatobiliary: 21.5 cm in length mildly steatotic liver, no focal ?abnormality without  contrast. Gallbladder and bile ducts are ?unremarkable. ?  ?Pancreas: There are scattered punctate calcifications suggesting ?chronic calcific pancreatitis. No acute pancreatitis is seen. ?Pancreas otherwise [place]

## 2021-11-11 NOTE — Progress Notes (Signed)
Family Medicine Teaching Service ?Daily Progress Note ?Intern Pager: [phone] ? ?Patient name: Nicholas Estes Medical record number: [id number] ?Date of birth: [birth year] Age: 61 y.o. Gender: male ? ?Primary Care Provider: [facility] ?Consultants: None ?Code Status: Full ? ?Pt Overview and Major Events to Date:  ?4/11- admitted, acutely agitated ?4/11- transferred to ICU ?4/12- intubated ?4/16- extubated ?4/20- transferred back to FPTS ? ?Assessment and Plan: ?Nicholas Estes is a 61 year old who presented with encephalopathy of unclear etiology require ICU level care with intubation as since resolved.  Past medical history significant for alcohol abuse, tremors, atrial fibrillation ([place]), CVA and conversion disorder.  Patient medically stable and appropriate for discharge to SNF vs home with [facility] pending PT recs. ?  ?Acute encephalopathy, resolved ?Mentating well, would very much like to go home. Has arranged 24h supervision with his girlfriend who is a Lawyer.  ?- Continue oxcarbazepine 300mg  BID ?- PT to evaluate for feasibility of home with  HH vs SNF ?- OOB ?- Fall precautions ?  ? Decreased PO Intake, resolved ?Eating and drinking at baseline. ?- MVI ?- Regular diet ?- Electrolytes Friday if still here ?  ?HTN ?BP within acceptable limits for hospitalized patient. ?- Continue Coreg 12.5mg  BID ?  ?Persistent A Fib ?HR has normalized ?- Continue home Eliquis ?- Coreg as above ? ?Pressure Injury of Skin ?Present on admission. Dressing in place.  ? ?FEN/GI: Regular ?PPx: On Eliquis ?Dispo: Home with HH vs SNF today, barriers include PT eval and placement.  ? ?Subjective:  ?Nicholas Estes feels well.  He would very much like to go home.  He has worked out 24-hour supervision with his girlfriend Monday who is a Nicholas Estes and is able to take a few days off of work.  He also receives home health services with CNA's to come to see him Monday through Friday in the mornings. ? ?Objective: ?Temp:  [98.1 ?F (36.7 ?C)-98.7 ?F  (37.1 ?C)] 98.7 ?F (37.1 ?C) (04/25 0809) ?Pulse Rate:  [74-95] 94 (04/25 0809) ?Resp:  [15-22] 15 (04/25 0809) ?BP: (106-149)/(71-84) 141/84 (04/25 0809) ?SpO2:  [95 %-98 %] 96 % (04/25 0809) ?Weight:  [104.9 kg] 104.9 kg (04/25 0312) ?Physical Exam: ?General: Awake and alert, in good spirits and NAD ?Cardiovascular: Irregularly irregular, without murmur ?Respiratory: Normal work of breathing on room air ?Abdomen: Nontender, nondistended ?Extremities: Without edema ? ?Laboratory: ?No new labs ? ? ?Imaging/Diagnostic Tests: ?No new imaging ? ?07-05-1990, MD ?11/11/2021, 9:09 AM ?PGY-1, [facility] ?FPTS Intern pager: [phone], text pages welcome ? ?

## 2021-11-11 NOTE — Progress Notes (Signed)
Physical Therapy Treatment ?Patient Details ?Name: Nicholas Estes ?MRN: [id number] ?DOB: Jan 28, 1961 ?Today's Date: 11/11/2021 ? ? ?History of Present Illness 61 year old male admitted 4/11 with altered mental status, found to have acute metabolic encephalopathy and aspiration PNA. MRI showing remote right occipital infarct , EEG negative for seizures , intubated/ 4/12-16. PMH: A-fib, bilateral CVA, and conversion disorder with pseudoseizures ? ?  ?PT Comments  ? ? Pt continues to improve in regard to cognition and mobility. Was able to come to edge of flat bed without use of rails, independently with increased time given. Pt ambulated 31' with RW and supervision with ability to verbalize the limitations in his gait due to RLE tone and weakness. Pt agreeable to HHPT and changing d/c recs to this. PT will continue to follow if pt does not d/c today.  ? ?   ?Recommendations for follow up therapy are one component of a multi-disciplinary discharge planning process, led by the attending physician.  Recommendations may be updated based on patient status, additional functional criteria and insurance authorization. ? ?Follow Up Recommendations ? Home health PT ?  ?  ?Assistance Recommended at Discharge Intermittent Supervision/Assistance  ?Patient can return home with the following Assistance with cooking/housework;Direct supervision/assist for medications management;Direct supervision/assist for financial management;Assist for transportation;A little help with walking and/or transfers;A little help with bathing/dressing/bathroom;Help with stairs or ramp for entrance ?  ?Equipment Recommendations ? None recommended by PT  ?  ?Recommendations for Other Services   ? ? ?  ?Precautions / Restrictions Precautions ?Precautions: Fall ?Precaution Comments: long history of falling, ?Restrictions ?Weight Bearing Restrictions: No  ?  ? ?Mobility ? Bed Mobility ?Overal bed mobility: Modified Independent ?Bed Mobility: Supine to Sit ?  ?   ?Supine to sit: Modified independent (Device/Increase time) ?  ?  ?General bed mobility comments: bed placed in flat position, no rails. Pt able to come to EOB independently with increased time ?  ? ?Transfers ?Overall transfer level: Needs assistance ?Equipment used: Rolling walker (2 wheels) ?Transfers: Sit to/from Stand ?Sit to Stand: Supervision ?  ?  ?  ?  ?  ?General transfer comment: supervision and pt needs increased time but no physical assist ?  ? ?Ambulation/Gait ?Ambulation/Gait assistance: Supervision ?Gait Distance (Feet): 550 Feet ?Assistive device: Rolling walker (2 wheels) ?Gait Pattern/deviations: Step-to pattern, Decreased dorsiflexion - right, Decreased step length - right ?Gait velocity: reduced ?Gait velocity interpretation: <1.8 ft/sec, indicate of risk for recurrent falls ?  ?General Gait Details: pt continues to have tone RLE and decreased foot clearance, managing with RW. HR 155 bpm by end of ambulation, sats remained mid to upper 90's on RA. HR returned to <100 with 3 mins seated rest ? ? ?Stairs ?  ?  ?  ?  ?  ? ? ?Wheelchair Mobility ?  ? ?Modified Rankin (Stroke Patients Only) ?  ? ? ?  ?Balance Overall balance assessment: Needs assistance ?Sitting-balance support: No upper extremity supported, Feet supported ?Sitting balance-Leahy Scale: Good ?  ?  ?Standing balance support: Bilateral upper extremity supported, Reliant on assistive device for balance ?Standing balance-Leahy Scale: Poor ?Standing balance comment: currently needs UE support for safety ?  ?  ?  ?  ?  ?  ?  ?  ?  ?  ?  ?  ? ?  ?Cognition Arousal/Alertness: Awake/alert ?Behavior During Therapy: [facility] for tasks assessed/performed ?Overall Cognitive Status: Impaired/Different from baseline ?Area of Impairment: Problem solving, Awareness ?  ?  ?  ?  ?  ?  ?  ?  ?  ?  Current Attention Level: Sustained ?Memory: Decreased short-term memory ?  ?  ?Awareness: Emergent ?  ?General Comments: patient continues to improve cognitively  describing who he has for support and showing insight into deficits as well as being able to state that when he goes home he needs to ask himself not "can I do that?" but "should I do that?" ?  ?  ? ?  ?Exercises   ? ?  ?General Comments General comments (skin integrity, edema, etc.): discussed safe decisions at d/c, HHPT, and supervision needed ?  ?  ? ?Pertinent Vitals/Pain Pain Assessment ?Pain Assessment: No/denies pain  ? ? ?Home Living   ?  ?  ?  ?  ?  ?  ?  ?  ?  ?   ?  ?Prior Function    ?  ?  ?   ? ?PT Goals (current goals can now be found in the care plan section) Acute Rehab PT Goals ?Patient Stated Goal: get back to walking with cane ?PT Goal Formulation: With patient ?Time For Goal Achievement: 11/19/21 ?Potential to Achieve Goals: Fair ?Progress towards PT goals: Progressing toward goals ? ?  ?Frequency ? ? ? Min 3X/week ? ? ? ?  ?PT Plan Discharge plan needs to be updated;Frequency needs to be updated  ? ? ?Co-evaluation   ?  ?  ?  ?  ? ?  ?AM-PAC PT "6 Clicks" Mobility   ?Outcome Measure ? Help needed turning from your back to your side while in a flat bed without using bedrails?: A Little ?Help needed moving from lying on your back to sitting on the side of a flat bed without using bedrails?: A Little ?Help needed moving to and from a bed to a chair (including a wheelchair)?: A Little ?Help needed standing up from a chair using your arms (e.g., wheelchair or bedside chair)?: A Little ?Help needed to walk in hospital room?: A Little ?Help needed climbing 3-5 steps with a railing? : A Lot ?6 Click Score: 17 ? ?  ?End of Session Equipment Utilized During Treatment: Gait belt ?Activity Tolerance: Patient tolerated treatment well ?Patient left: in chair;with call bell/phone within reach;with chair alarm set ?Nurse Communication: Mobility status ?PT Visit Diagnosis: Unsteadiness on feet (R26.81);Other abnormalities of gait and mobility (R26.89);Repeated falls (R29.6);Muscle weakness (generalized)  (M62.81);History of falling (Z91.81);Difficulty in walking, not elsewhere classified (R26.2);Other symptoms and signs involving the nervous system (R29.898);Pain ?  ? ? ?Time: [id number] ?PT Time Calculation (min) (ACUTE ONLY): 26 min ? ?Charges:  $Gait Training: 23-37 mins          ?          ? ?Leighton Roach, PT  ?Acute Rehab Services ? Pager [phone] ?Office [phone] ? ? ? ?[place] ?11/11/2021, 9:56 AM ? ?

## 2021-11-11 NOTE — TOC Progression Note (Addendum)
Transition of Care (TOC) - Progression Note  ? ? ?Patient Details  ?Name: Nicholas Estes ?MRN: [id number] ?Date of Birth: 1961-06-23 ? ?Transition of Care (TOC) CM/SW Contact  ?Angelita Ingles, RN ?Phone [phone] ? ?11/11/2021, 12:19 PM ? ?Clinical Narrative:    ?Cm at bedside to discuss Home health recommendations. Patient states that he has caregivers for limited hours 6 days out of the week but is open to home health recommendations. CM provided patient with medicare.gov list for choice for home health. Patient states that he does not have a preference for agency. Home health referral accepted by Amy with [facility].  Latricia Heft will provide [facility] PT/OT. Patient currently has [place] aide through his VA benefits. ? ? ?Expected Discharge Plan: [place] ?Barriers to Discharge: Continued Medical Work up ? ?Expected Discharge Plan and Services ?Expected Discharge Plan: [place] ?In-house Referral: NA ?Discharge Planning Services: CM Consult ?Post Acute Care Choice: Home Health ?Living arrangements for the past 2 months: [place] ?                ?DME Arranged: N/A ?  ?  ?  ?  ?HH Arranged: PT, OT ?[place] Agency: [facility] ?Date HH Agency Contacted: 11/11/21 ?Time [place]: 1218 ?Representative spoke with at [place]: Amy ? ? ?Social Determinants of Health (SDOH) Interventions ?  ? ?Readmission Risk Interventions ? ?  11/11/2021  ? 12:02 PM  ?Readmission Risk Prevention Plan  ?Transportation Screening Complete  ?PCP or Specialist Appt within 5-7 Days Complete  ?Home Care Screening Complete  ?Medication Review (RN CM) Complete  ? ? ?

## 2021-11-13 NOTE — Discharge Summary (Signed)
Family Medicine Teaching Service ?Hospital Discharge Summary ? ?Patient name: Nicholas Estes Medical record number: [id number] ?Date of birth: Nov 18, 1960 Age: 61 y.o. Gender: male ?Date of Admission: 10/28/2021  Date of Discharge: 11/11/2021 ?Admitting Physician: Etheleen Nicks, MD ? ?Primary Care Provider: [facility] ?Consultants: PCCM ? ?Indication for Hospitalization: Acute Metabolic Encephalopathy ? ?Discharge Diagnoses/Problem List:  ?Principal Problem: ?  Encephalopathy ?Active Problems: ?  Alcohol withdrawal ([place]) ?  Pressure injury of skin ?  Normocytic anemia ?  Paroxysmal atrial fibrillation (HCC) ?  Hypertension ? ? ? ?Disposition: Home with Home Health ? ?Discharge Condition: Stable ? ?Discharge Exam:  ?From my same-day progress note: ?General: Awake and alert, in good spirits and NAD ?Cardiovascular: Irregularly irregular, without murmur ?Respiratory: Normal work of breathing on room air ?Abdomen: Nontender, nondistended ?Extremities: Without edema ?  ? ?Brief Hospital Course:  ?Nicholas Estes is a 61 y.o.male with a history of alcohol abuse, tremors, A-fib on Eliquis, stroke, conversion disorder, pseudoseizures who was admitted to the [facility] Teaching Service at [facility] for acute encephalopathy. His hospital course is detailed below: ? ?Metabolic encephalopathy with agitated delirium thought to be secondary to overdose on several medication ?Patient presented with altered mental status and thick white emesis.  Patient had waxing and waning mental status with intermittent agitation.  Patient received Narcan x2 with no improvement.  Patient required Haldol to obtain CT head which did not show any acute intracranial abnormalities.  Patient was negative for alcohol, ammonia, Tylenol, and salicylate.  UDS was positive for THC.  Concern of medication overdose was raised given patient being found next to 2 empty bottles of medications.  Poison control was called and recommended supportive care  with electrolyte monitoring.  Neurology was also consulted due to concerns of alcohol withdrawal and possible seizures. Patient was started on continuous EEG and loaded with Keppra. Critical care was consulted due to extreme agitation. Patient was eventually intubated and sedated.  He was extubated on 4/16.   ?The ultimate cause of his symptoms remained unclear, the most likely cause was an overdose of oxcarbazepine and atorvastatin as evidenced by his elevated oxcarbazepine level on admission and his subsequent development of rhabdomyolysis.  However patient denied any suicidal thoughts to psychiatry and was psychiatrically cleared. ?Patient remained quite weak and with limited mobility after the acute phase of his hospitalization. PT/OT recommended SNF but did not receive any bed offers for several days. Through working with PT/OT while in house, he was able to gain enough strength to discharge home with home health rehab services.  ? ?Acute hypoxic/hypercapnic respiratory failure ?Aspiration pneumonia ?Patient became hypoxic with oxygenation saturation dropped down to the mid 80s on 5 L oxygen.  Patient had copious amounts of greenish secretion pooling in the back of his throat and given inability to protect his airway, decision was to be patient as well as undergo bronchoscopy which noted copious amounts of thick creamy secretions all over respiratory tract.  Patient was also started on IV Unasyn and aggressive IV fluids.  He received a total of 4 days of Unasyn before it was discontinued.  He remained comfortable on room air for the remainder of the hospitalization. ?  ?Rhabdomyolysis ?CK peaked 1587 and fluctuated while in the ICU. Received aggressive fluid resuscitation.  With resolution of his CK by 4/18.  This was thought to be secondary to a statin overdose. ? ?Ileus ?Patient had progressive abdominal pain and distention during his hospitalization. CT abdomen on 4/17 and 4/19 showed dilatation  of both the  stomach and the bowel.  There was some concern initially for SBO but the patient was noted to have stool output.  He was decompressed with an NG tube and then started on a soft diet as tolerated.  Patient was amenable to SNF, and was discharged to one in stable condition. ? ? ?PCP Follow-up Recommendations: ?patient endorsed depressed mood secondary to your recent divorce and his recent stroke.  He denied any acute suicidality but would benefit from close outpatient mental health follow-up. We are holding his Welbutrin on discharge due to concern for lowering his seizure threshold. Please reassess the appropriateness of re-starting this medication or changing medication classes.  ?We decreased the dose of the patient's oxcarbazepine to what appears to be the minimal effective dose, 300mg  BID. Please dose adjust as necessary.  ?Patient has arranged 24hour supervision at home with his girlfriend for at least a few days post-discharge. Please ensure that he has the support necessary to continue functioning safely at home.  ? ? ?Significant Procedures: None ? ?Significant Labs and Imaging:  ?No results for input(s): WBC, HGB, HCT, PLT in the last 168 hours. ? ?Recent Labs  ?Lab 11/07/21 ?0324 11/09/21 ?0323  ?NA 137 136  ?K 3.9 4.1  ?CL 105 105  ?CO2 26 25  ?GLUCOSE 102* 99  ?BUN <5* <5*  ?CREATININE 0.67 0.71  ?CALCIUM 8.3* 8.3*  ?MG 2.1 2.0  ?PHOS 3.1 3.6  ? ? ? ?EXAM: ?MRI HEAD WITHOUT CONTRAST ?  ?TECHNIQUE: ?Multiplanar, multiecho pulse sequences of the brain and surrounding ?structures were obtained without intravenous contrast. ?  ?COMPARISON:  CT head without contrast 10/28/2021. MR head without ?contrast 11/05/2019 ?  ?FINDINGS: ?Brain: Remote right occipital lobe infarct noted. Remote blood ?products associated. No acute infarct or hemorrhage is present. ?Minimal white matter disease is present otherwise. Mild generalized ?atrophy is noted. Ex vacuo dilation of the posterior horn right ?lateral ventricle  present. No significant extraaxial fluid ?collection is present. ?  ?Insert normal IC The brainstem and cerebellum are within normal ?limits. ?  ?Vascular: Flow is present in the major intracranial arteries. ?  ?Skull and upper cervical spine: The craniocervical junction is ?normal. Upper cervical spine is within normal limits. Marrow signal ?is unremarkable. ?  ?Sinuses/Orbits: Extensive sinus disease is present. Fluid levels are ?present the maxillary sinuses bilaterally. Fluid is also present in ?the nasopharynx. Mild mucosal thickening is present throughout the ?ethmoid air cells and sphenoid sinuses. Mastoid air cells are clear. ?  ?IMPRESSION: ?1. No acute intracranial abnormality. ?2. Remote right occipital lobe infarct. ?3. Extensive sinus disease. ?  ?  ?Electronically Signed ?  By: 11/07/2019 M.D. ?  On: 10/31/2021 19:45 ? ? ?EXAM: ?CT ABDOMEN AND PELVIS WITHOUT CONTRAST ?  ?TECHNIQUE: ?Multidetector CT imaging of the abdomen and pelvis was performed ?following the standard protocol without IV contrast. ?  ?RADIATION DOSE REDUCTION: This exam was performed according to the ?departmental dose-optimization program which includes automated ?exposure control, adjustment of the mA and/or kV according to ?patient size and/or use of iterative reconstruction technique. ?  ?COMPARISON:  CT with oral contrast only recently 11/03/2021, CTA ?chest, abdomen and pelvis 02/14/2020. ?  ?FINDINGS: ?Lower chest: There are small right-greater-than-left pleural ?effusions again noted with adjacent consolidation or atelectasis in ?the lower lobes, asymmetric elevation right hemidiaphragm. ?  ?The heart is slightly enlarged. Small pericardial effusion is ?unchanged. ?  ?Hepatobiliary: 21.5 cm in length mildly steatotic liver, no focal ?abnormality without contrast. Gallbladder  and bile ducts are ?unremarkable. ?  ?Pancreas: There are scattered punctate calcifications suggesting ?chronic calcific pancreatitis. No acute  pancreatitis is seen. ?Pancreas otherwise unremarkable without contrast. ?  ?Spleen: Unremarkable without contrast. ?  ?Adrenals/Urinary Tract: There is no adrenal mass no focal ?abnormality in the renal co

## 2023-09-25 ENCOUNTER — Other Ambulatory Visit: Payer: Self-pay

## 2023-09-25 ENCOUNTER — Inpatient Hospital Stay (HOSPITAL_COMMUNITY)
Admission: EM | Admit: 2023-09-25 | Discharge: 2023-09-28 | DRG: 189 | Disposition: A | Discharge status: Home / Self Care | Attending: Internal Medicine | Admitting: Internal Medicine

## 2023-09-25 ENCOUNTER — Emergency Department (HOSPITAL_COMMUNITY)

## 2023-09-25 DIAGNOSIS — Z79899 Other long term (current) drug therapy: Secondary | ICD-10-CM

## 2023-09-25 DIAGNOSIS — Z720 Tobacco use: Secondary | ICD-10-CM | POA: Diagnosis not present

## 2023-09-25 DIAGNOSIS — Z1152 Encounter for screening for COVID-19: Secondary | ICD-10-CM

## 2023-09-25 DIAGNOSIS — N400 Enlarged prostate without lower urinary tract symptoms: Secondary | ICD-10-CM | POA: Diagnosis present

## 2023-09-25 DIAGNOSIS — J439 Emphysema, unspecified: Secondary | ICD-10-CM | POA: Diagnosis present

## 2023-09-25 DIAGNOSIS — I480 Paroxysmal atrial fibrillation: Secondary | ICD-10-CM | POA: Diagnosis present

## 2023-09-25 DIAGNOSIS — I5033 Acute on chronic diastolic (congestive) heart failure: Secondary | ICD-10-CM | POA: Diagnosis present

## 2023-09-25 DIAGNOSIS — F32A Depression, unspecified: Secondary | ICD-10-CM | POA: Diagnosis present

## 2023-09-25 DIAGNOSIS — I959 Hypotension, unspecified: Secondary | ICD-10-CM | POA: Diagnosis present

## 2023-09-25 DIAGNOSIS — I110 Hypertensive heart disease with heart failure: Secondary | ICD-10-CM | POA: Diagnosis present

## 2023-09-25 DIAGNOSIS — Z66 Do not resuscitate: Secondary | ICD-10-CM | POA: Diagnosis present

## 2023-09-25 DIAGNOSIS — Z7901 Long term (current) use of anticoagulants: Secondary | ICD-10-CM | POA: Diagnosis not present

## 2023-09-25 DIAGNOSIS — E669 Obesity, unspecified: Secondary | ICD-10-CM | POA: Diagnosis present

## 2023-09-25 DIAGNOSIS — J9601 Acute respiratory failure with hypoxia: Principal | ICD-10-CM | POA: Diagnosis present

## 2023-09-25 DIAGNOSIS — I69354 Hemiplegia and hemiparesis following cerebral infarction affecting left non-dominant side: Secondary | ICD-10-CM | POA: Diagnosis not present

## 2023-09-25 DIAGNOSIS — R531 Weakness: Secondary | ICD-10-CM

## 2023-09-25 DIAGNOSIS — Z8673 Personal history of transient ischemic attack (TIA), and cerebral infarction without residual deficits: Secondary | ICD-10-CM

## 2023-09-25 DIAGNOSIS — J441 Chronic obstructive pulmonary disease with (acute) exacerbation: Secondary | ICD-10-CM | POA: Diagnosis present

## 2023-09-25 DIAGNOSIS — K219 Gastro-esophageal reflux disease without esophagitis: Secondary | ICD-10-CM | POA: Diagnosis present

## 2023-09-25 DIAGNOSIS — I10 Essential (primary) hypertension: Secondary | ICD-10-CM | POA: Diagnosis present

## 2023-09-25 DIAGNOSIS — Z7982 Long term (current) use of aspirin: Secondary | ICD-10-CM | POA: Diagnosis not present

## 2023-09-25 DIAGNOSIS — J9621 Acute and chronic respiratory failure with hypoxia: Secondary | ICD-10-CM

## 2023-09-25 DIAGNOSIS — M179 Osteoarthritis of knee, unspecified: Secondary | ICD-10-CM | POA: Insufficient documentation

## 2023-09-25 DIAGNOSIS — I5031 Acute diastolic (congestive) heart failure: Secondary | ICD-10-CM | POA: Diagnosis not present

## 2023-09-25 HISTORY — DX: Nicotine dependence, unspecified, uncomplicated: F17.200

## 2023-09-25 LAB — RESP PANEL BY RT-PCR (RSV, FLU A&B, COVID)  RVPGX2
Influenza A by PCR: NEGATIVE
Influenza B by PCR: NEGATIVE
Resp Syncytial Virus by PCR: NEGATIVE
SARS Coronavirus 2 by RT PCR: NEGATIVE

## 2023-09-25 LAB — CBC WITH DIFFERENTIAL/PLATELET
Abs Immature Granulocytes: 0.01 10*3/uL (ref 0.00–0.07)
Basophils Absolute: 0 10*3/uL (ref 0.0–0.1)
Basophils Relative: 0 %
Eosinophils Absolute: 0 10*3/uL (ref 0.0–0.5)
Eosinophils Relative: 0 %
HCT: 39.1 % (ref 39.0–52.0)
Hemoglobin: 13 g/dL (ref 13.0–17.0)
Immature Granulocytes: 0 %
Lymphocytes Relative: 9 %
Lymphs Abs: 0.6 10*3/uL — ABNORMAL LOW (ref 0.7–4.0)
MCH: 30.4 pg (ref 26.0–34.0)
MCHC: 33.2 g/dL (ref 30.0–36.0)
MCV: 91.4 fL (ref 80.0–100.0)
Monocytes Absolute: 0.3 10*3/uL (ref 0.1–1.0)
Monocytes Relative: 5 %
Neutro Abs: 5 10*3/uL (ref 1.7–7.7)
Neutrophils Relative %: 86 %
Platelets: 219 10*3/uL (ref 150–400)
RBC: 4.28 MIL/uL (ref 4.22–5.81)
RDW: 14.5 % (ref 11.5–15.5)
WBC: 5.9 10*3/uL (ref 4.0–10.5)
nRBC: 0 % (ref 0.0–0.2)

## 2023-09-25 LAB — COMPREHENSIVE METABOLIC PANEL
ALT: 16 U/L (ref 0–44)
AST: 29 U/L (ref 15–41)
Albumin: 3.4 g/dL — ABNORMAL LOW (ref 3.5–5.0)
Alkaline Phosphatase: 91 U/L (ref 38–126)
Anion gap: 12 (ref 5–15)
BUN: 10 mg/dL (ref 8–23)
CO2: 24 mmol/L (ref 22–32)
Calcium: 8.1 mg/dL — ABNORMAL LOW (ref 8.9–10.3)
Chloride: 99 mmol/L (ref 98–111)
Creatinine, Ser: 0.8 mg/dL (ref 0.61–1.24)
GFR, Estimated: >60 mL/min (ref >60)
Glucose, Bld: 136 mg/dL — ABNORMAL HIGH (ref 70–99)
Potassium: 3.7 mmol/L (ref 3.5–5.1)
Sodium: 135 mmol/L (ref 135–145)
Total Bilirubin: 0.9 mg/dL (ref 0.0–1.2)
Total Protein: 6.9 g/dL (ref 6.5–8.1)

## 2023-09-25 LAB — URINALYSIS, W/ REFLEX TO CULTURE (INFECTION SUSPECTED)
Bacteria, UA: NONE SEEN
Bilirubin Urine: NEGATIVE
Glucose, UA: NEGATIVE mg/dL
Ketones, ur: 20 mg/dL — AB
Leukocytes,Ua: NEGATIVE
Nitrite: NEGATIVE
Protein, ur: 30 mg/dL — AB
RBC / HPF: >50 RBC/hpf (ref 0–5)
Specific Gravity, Urine: 1.015 (ref 1.005–1.030)
pH: 5 (ref 5.0–8.0)

## 2023-09-25 LAB — PROTIME-INR
INR: 1.1 (ref 0.8–1.2)
Prothrombin Time: 14.5 s (ref 11.4–15.2)

## 2023-09-25 LAB — APTT: aPTT: 28 s (ref 24–36)

## 2023-09-25 LAB — I-STAT CG4 LACTIC ACID, ED: Lactic Acid, Venous: 0.8 mmol/L (ref 0.5–1.9)

## 2023-09-25 LAB — TROPONIN I (HIGH SENSITIVITY): Troponin I (High Sensitivity): 17 ng/L (ref <18)

## 2023-09-25 MED ORDER — ACETAMINOPHEN 500 MG PO TABS
1000.0000 mg | ORAL_TABLET | Freq: Once | ORAL | Status: AC
  Administered 2023-09-25: 1000 mg via ORAL
  Filled 2023-09-25: qty 2

## 2023-09-25 MED ORDER — OSELTAMIVIR PHOSPHATE 75 MG PO CAPS
75.0000 mg | ORAL_CAPSULE | Freq: Once | ORAL | Status: AC
  Administered 2023-09-25: 75 mg via ORAL
  Filled 2023-09-25: qty 1

## 2023-09-25 MED ORDER — ROSUVASTATIN CALCIUM 20 MG PO TABS
20.0000 mg | ORAL_TABLET | Freq: Every day | ORAL | Status: DC
  Administered 2023-09-26 – 2023-09-28 (×3): 20 mg via ORAL
  Filled 2023-09-25 (×3): qty 1

## 2023-09-25 MED ORDER — MAGNESIUM SULFATE 2 GM/50ML IV SOLN
2.0000 g | Freq: Once | INTRAVENOUS | Status: AC
  Administered 2023-09-25: 2 g via INTRAVENOUS
  Filled 2023-09-25: qty 50

## 2023-09-25 MED ORDER — SODIUM CHLORIDE 0.9 % IV BOLUS
1000.0000 mL | Freq: Once | INTRAVENOUS | Status: AC
  Administered 2023-09-25: 1000 mL via INTRAVENOUS

## 2023-09-25 MED ORDER — CARVEDILOL 12.5 MG PO TABS
12.5000 mg | ORAL_TABLET | Freq: Two times a day (BID) | ORAL | Status: DC
  Administered 2023-09-26: 12.5 mg via ORAL
  Filled 2023-09-25: qty 1

## 2023-09-25 MED ORDER — ALBUTEROL SULFATE (2.5 MG/3ML) 0.083% IN NEBU
5.0000 mg | INHALATION_SOLUTION | Freq: Once | RESPIRATORY_TRACT | Status: AC
  Administered 2023-09-25: 5 mg via RESPIRATORY_TRACT
  Filled 2023-09-25: qty 6

## 2023-09-25 NOTE — H&P (Signed)
 History and Physical    Patient: Nicholas Estes:[id number] DOB: 09-23-1960 DOA: 09/25/2023 DOS: the patient was seen and examined on 09/25/2023 PCP: [facility]  Patient coming from: Home  Chief Complaint:  Chief Complaint  Patient presents with   Shortness of Breath   HPI: Nicholas Estes is a 63 y.o. male with medical history significant of atrial fibrillation on chronic anticoagulation with Eliquis, essential hypertension, history of CVA, tobacco abuse who lives at home with residual left-sided weakness.  Patient has caregiver that comes regularly.  She has been out with the flu and has not been there lately.  Patient has been having flulike symptoms for the last couple of days.  Was brought to the ER due to weakness and shortness of breath.  He was also having some cough.  Patient's oxygen sat appears to be in the low 90s now on oxygen.  But was 87% on arrival he is also tachycardic.  Does not meet sepsis criteria.  Viral screen so far has been negative in the ER.  Urinalysis also unimpressive.  Chest x-ray showing possible pulmonary edema.  Patient however has only indications for possible pneumonia versus COPD exacerbation.  He is being admitted for further evaluation and treatment.  Review of Systems: As mentioned in the history of present illness. All other systems reviewed and are negative. Past Medical History:  Diagnosis Date   Atrial fibrillation (HCC)    Hypertension    Stroke (cerebrum) (HCC) 11/04/2019   Past Surgical History:  Procedure Laterality Date   IR CT HEAD LTD  11/04/2019   IR CT HEAD LTD  11/04/2019   IR PERCUTANEOUS ART THROMBECTOMY/INFUSION INTRACRANIAL INC DIAG ANGIO  11/04/2019   IR US GUIDE VASC ACCESS RIGHT  11/04/2019   RADIOLOGY WITH ANESTHESIA N/A 11/04/2019   Procedure: IR WITH ANESTHESIA;  Surgeon: Julieanne Cotton, MD;  Location: [facility] OR;  Service: Radiology;  Laterality: N/A;   Social History:  has no history on file for tobacco use, alcohol  use, and drug use.  No Known Allergies  No family history on file.  Prior to Admission medications   Medication Sig Start Date End Date Taking? Authorizing Provider  acetaminophen (TYLENOL) 325 MG tablet Take 2 tablets (650 mg total) by mouth every 4 (four) hours as needed for mild pain (or temp > 37.5 C (99.5 F)). 11/15/19   Metzger-Cihelka, Desiree, NP  apixaban (ELIQUIS) 5 MG TABS tablet Take 1 tablet (5 mg total) by mouth 2 (two) times daily. 11/15/19   Metzger-Cihelka, Desiree, NP  aspirin EC 81 MG tablet Take 81 mg by mouth daily. Swallow whole. Patient not taking: Reported on 11/06/2021    [provider]  carvedilol (COREG) 12.5 MG tablet Take 1 tablet (12.5 mg total) by mouth 2 (two) times daily with a meal. 11/11/21   Alicia Amel, MD  ergocalciferol (VITAMIN D2) 1.25 MG (50000 UT) capsule Take 50,000 Units by mouth once a week.    [provider]  folic acid (FOLVITE) 1 MG tablet Take 1 tablet (1 mg total) by mouth daily. Patient not taking: Reported on 11/06/2021 11/16/19   Metzger-Cihelka, Cristie Hem, NP  Oxcarbazepine (TRILEPTAL) 300 MG tablet Take 1 tablet (300 mg total) by mouth 2 (two) times daily. 11/11/21   Alicia Amel, MD  rosuvastatin (CRESTOR) 20 MG tablet Take 1 tablet (20 mg total) by mouth daily. 11/11/21   Alicia Amel, MD  tamsulosin (FLOMAX) 0.4 MG CAPS capsule Take 1 capsule by mouth daily. 07/11/21  [provider]  thiamine 100 MG tablet Take 1 tablet (100 mg total) by mouth daily. Patient not taking: Reported on 08/05/2021 11/16/19   Metzger-Cihelka, Cristie Hem, NP    Physical Exam: Vitals:   09/25/23 1645 09/25/23 1830 09/25/23 1930 09/25/23 2034  BP: 139/78  (!) 151/84   Pulse: (!) 118 (!) 111 71   Resp: (!) 21 17 (!) 22   Temp: 99 F (37.2 C)   98.1 F (36.7 C)  TempSrc:      SpO2: 95% 94% 98%   Weight:      Height:       Constitutional: Acute on chronically ill looking, NAD, calm, comfortable Eyes: PERRL, lids and  conjunctivae normal ENMT: Mucous membranes are moist. Posterior pharynx clear of any exudate or lesions.Normal dentition.  Neck: normal, supple, no masses, no thyromegaly Respiratory: Decreased air entry with coarse breath sound no wheezing, mild diffuse crackles. Normal respiratory effort. No accessory muscle use.  Cardiovascular: Irregularly irregular with tachycardia, no murmurs / rubs / gallops. No extremity edema. 2+ pedal pulses. No carotid bruits.  Abdomen: no tenderness, no masses palpated. No hepatosplenomegaly. Bowel sounds positive.  Musculoskeletal: Good range of motion, no joint swelling or tenderness, Skin: no rashes, lesions, ulcers. No induration Neurologic: CN 2-12 grossly intact. Sensation intact, DTR normal.  Residual left hemiparesis Psychiatric: Normal judgment and insight. Alert and oriented x 3. Normal mood  Data Reviewed:  Temperature 99.4, blood pressure 151/84, respirate of 21, pulse 118 oxygen sat 87% on room air.  Acute viral screen is negative.  CBC largely within normal.  Glucose 136 with the rest of the chemistry within normal.  Troponin only 17.  Lactic acid 0.8.  Urinalysis negative.  EKG shows A-fib with RVR.  Chest x-ray showed findings suggestive of pulmonary edema.  Assessment and Plan:  #1 acute hypoxic respiratory failure: Probably multifactorial.  Patient has sick contact with the flu but flu negative.  There is reported possible CHF but more likely to be COPD and pneumonia probably in early stages.  Patient may benefit from CT angiogram of the chest.  In the meantime we will treat patient with nebulizer treatment, empiric antibiotics and steroids.  With the chest x-ray suggestive of possible CHF we will get an echocardiogram.  Patient is a non-smoker so COPD is likely.  #2 paroxysmal atrial fibrillation with rapid response: Rate is 120.  Resume home regimen.  Continue Eliquis and rate control.  #3 tobacco abuse: Will initiate nicotine patch and tobacco  cessation counseling  #4 history of CVA: Residual symptoms.  Continue monitor  #5 essential hypertension: Continue blood pressure monitoring and treatment with home regimen.  #6 remote history of alcoholism: No recent use.  Continue to monitor    Advance Care Planning:   Code Status: Full Code   Consults: None  Family Communication: No family at bedside  Severity of Illness: The appropriate patient status for this patient is INPATIENT. Inpatient status is judged to be reasonable and necessary in order to provide the required intensity of service to ensure the patient's safety. The patient's presenting symptoms, physical exam findings, and initial radiographic and laboratory data in the context of their chronic comorbidities is felt to place them at high risk for further clinical deterioration. Furthermore, it is not anticipated that the patient will be medically stable for discharge from the hospital within 2 midnights of admission.   * I certify that at the point of admission it is my clinical judgment that the patient will  require inpatient hospital care spanning beyond 2 midnights from the point of admission due to high intensity of service, high risk for further deterioration and high frequency of surveillance required.*  AuthorLonia Blood, MD 09/25/2023 10:03 PM  For on call review [email or web address].

## 2023-09-25 NOTE — ED Provider Notes (Signed)
 [place] EMERGENCY DEPARTMENT AT [facility] Provider Note   CSN: [id number] Arrival date & time: 09/25/23  1351     History  Chief Complaint  Patient presents with   Shortness of Breath    Nicholas Estes is a 63 y.o. male.  63 yo M with a chief complaints of difficulty breathing.  He is not really sure how long it has been going on.  There is been a few days.  He has been sick, says he has the flu.  Got it from his caregiver.  Due to the flu the caregiver has been not coming to his house for the past 2 to 3 days.     Shortness of Breath      Home Medications Prior to Admission medications   Medication Sig Start Date End Date Taking? Authorizing Provider  acetaminophen (TYLENOL) 325 MG tablet Take 2 tablets (650 mg total) by mouth every 4 (four) hours as needed for mild pain (or temp > 37.5 C (99.5 F)). 11/15/19   Metzger-Cihelka, Desiree, NP  apixaban (ELIQUIS) 5 MG TABS tablet Take 1 tablet (5 mg total) by mouth 2 (two) times daily. 11/15/19   Metzger-Cihelka, Desiree, NP  aspirin EC 81 MG tablet Take 81 mg by mouth daily. Swallow whole. Patient not taking: Reported on 11/06/2021    [provider]  carvedilol (COREG) 12.5 MG tablet Take 1 tablet (12.5 mg total) by mouth 2 (two) times daily with a meal. 11/11/21   Alicia Amel, MD  ergocalciferol (VITAMIN D2) 1.25 MG (50000 UT) capsule Take 50,000 Units by mouth once a week.    [provider]  folic acid (FOLVITE) 1 MG tablet Take 1 tablet (1 mg total) by mouth daily. Patient not taking: Reported on 11/06/2021 11/16/19   Metzger-Cihelka, Cristie Hem, NP  Oxcarbazepine (TRILEPTAL) 300 MG tablet Take 1 tablet (300 mg total) by mouth 2 (two) times daily. 11/11/21   Alicia Amel, MD  rosuvastatin (CRESTOR) 20 MG tablet Take 1 tablet (20 mg total) by mouth daily. 11/11/21   Alicia Amel, MD  tamsulosin (FLOMAX) 0.4 MG CAPS capsule Take 1 capsule by mouth daily. 07/11/21   [provider]   thiamine 100 MG tablet Take 1 tablet (100 mg total) by mouth daily. Patient not taking: Reported on 08/05/2021 11/16/19   Metzger-Cihelka, Cristie Hem, NP      Allergies    Patient has no known allergies.    Review of Systems   Review of Systems  Respiratory:  Positive for shortness of breath.     Physical Exam Updated Vital Signs BP (!) 151/84   Pulse 71   Temp 98.1 F (36.7 C)   Resp (!) 22   Ht 5\' 8"  (1.727 m)   Wt 108.9 kg   SpO2 98%   BMI 36.49 kg/m  Physical Exam Vitals and nursing note reviewed.  Constitutional:      Appearance: He is well-developed.  HENT:     Head: Normocephalic and atraumatic.  Eyes:     Pupils: Pupils are equal, round, and reactive to light.  Neck:     Vascular: No JVD.  Cardiovascular:     Rate and Rhythm: Regular rhythm. Tachycardia present.     Heart sounds: No murmur heard.    No friction rub. No gallop.  Pulmonary:     Effort: No respiratory distress.     Breath sounds: No wheezing.     Comments: Tachypnea with prolonged expiratory effort Abdominal:  General: There is no distension.     Tenderness: There is no abdominal tenderness. There is no guarding or rebound.  Musculoskeletal:        General: Normal range of motion.     Cervical back: Normal range of motion and neck supple.  Skin:    Coloration: Skin is not pale.     Findings: No rash.  Neurological:     Mental Status: He is alert and oriented to person, place, and time.  Psychiatric:        Behavior: Behavior normal.     ED Results / Procedures / Treatments   Labs (all labs ordered are listed, but only abnormal results are displayed) Labs Reviewed  CBC WITH DIFFERENTIAL/PLATELET - Abnormal; Notable for the following components:      Result Value   Lymphs Abs 0.6 (*)    All other components within normal limits  URINALYSIS, W/ REFLEX TO CULTURE (INFECTION SUSPECTED) - Abnormal; Notable for the following components:   APPearance HAZY (*)    Hgb urine dipstick  LARGE (*)    Ketones, ur 20 (*)    Protein, ur 30 (*)    All other components within normal limits  COMPREHENSIVE METABOLIC PANEL - Abnormal; Notable for the following components:   Glucose, Bld 136 (*)    Calcium 8.1 (*)    Albumin 3.4 (*)    All other components within normal limits  CULTURE, BLOOD (ROUTINE X 2)  RESP PANEL BY RT-PCR (RSV, FLU A&B, COVID)  RVPGX2  CULTURE, BLOOD (ROUTINE X 2)  PROTIME-INR  APTT  BRAIN NATRIURETIC PEPTIDE  I-STAT CG4 LACTIC ACID, ED  TROPONIN I (HIGH SENSITIVITY)    EKG EKG Interpretation Date/Time:  Saturday September 25 2023 14:02:17 EST Ventricular Rate:  110 PR Interval:    QRS Duration:  107 QT Interval:  303 QTC Calculation: 410 R Axis:   29  Text Interpretation: Atrial fibrillation No significant change since last tracing Confirmed by Melene Plan [phone]) on 09/25/2023 3:11:38 PM  Radiology DG Chest Port 1 View Result Date: 09/25/2023 CLINICAL DATA:  Questionable sepsis - evaluate for abnormality EXAM: PORTABLE CHEST 1 VIEW COMPARISON:  November 03, 2021 FINDINGS: The cardiomediastinal silhouette is unchanged and enlarged in contour. No pleural effusion. No pneumothorax. Perihilar vascular congestion with mild diffuse interstitial prominence. Mild peribronchial cuffing. IMPRESSION: Findings suggestive of pulmonary edema. Electronically Signed   By: Meda Klinefelter M.D.   On: 09/25/2023 16:23    Procedures .Critical Care  Performed by: Melene Plan, DO Authorized by: Melene Plan, DO   Critical care provider statement:    Critical care time (minutes):  35   Critical care time was exclusive of:  Separately billable procedures and treating other patients   Critical care was time spent personally by me on the following activities:  Development of treatment plan with patient or surrogate, discussions with consultants, evaluation of patient's response to treatment, examination of patient, ordering and review of laboratory studies, ordering and review  of radiographic studies, ordering and performing treatments and interventions, pulse oximetry, re-evaluation of patient's condition and review of old charts   Care discussed with: admitting provider       Medications Ordered in ED Medications  sodium chloride 0.9 % bolus 1,000 mL (1,000 mLs Intravenous New Bag/Given 09/25/23 1620)  acetaminophen (TYLENOL) tablet 1,000 mg (1,000 mg Oral Given 09/25/23 1600)  oseltamivir (TAMIFLU) capsule 75 mg (75 mg Oral Given 09/25/23 1600)  magnesium sulfate IVPB 2 g 50 mL (0 g  Intravenous Stopped 09/25/23 1820)  albuterol (PROVENTIL) (2.5 MG/3ML) 0.083% nebulizer solution 5 mg (5 mg Nebulization Given 09/25/23 1604)    ED Course/ Medical Decision Making/ A&P                                 Medical Decision Making Amount and/or Complexity of Data Reviewed Labs: ordered. Radiology: ordered.  Risk OTC drugs. Prescription drug management. Decision regarding hospitalization.   63 yo M with a chief complaints of difficulty breathing.  This been going on for a few days.  He tells me that he has the flu.  Got it from his caregiver.  She has had to take some time off and he has been doing well at home.  He thinks he has been taking all of his medicines at home.  Found to be hypoxic with EMS thought to have a COPD exacerbation given DuoNebs and steroids with some improvement reported.  He still does not feel well.  He feels very hot on my exam, suspect he likely is febrile.  Will give a dose of Tylenol.  Still wheezing will give an albuterol treatment magnesium.  Chest x-ray blood work.  He is requiring oxygen not on oxygen at home.  I think likely secondary to the flu.  Will give a dose of Tamiflu here as well.        Final Clinical Impression(s) / ED Diagnoses Final diagnoses:  COPD exacerbation (HCC)  Acute on chronic respiratory failure with hypoxia [facility])    Rx / DC Orders ED Discharge Orders     None         Melene Plan, DO 09/25/23 2107

## 2023-09-25 NOTE — ED Triage Notes (Signed)
 Biba from home. Normally has nurse come daily, hasn't had nurse come In a few days. Family member states has not been feeling well, shob. Pt heavy smoker. Does not wear 02 at home. Was 90-94 percent on arrival per EMS room air. 1 Atrovent, 125mg  of solumedrol given in route. 100 % on 6L per EMS. Denies cp,dizziness or any other symptoms

## 2023-09-26 ENCOUNTER — Encounter (HOSPITAL_COMMUNITY): Payer: Self-pay | Admitting: Internal Medicine

## 2023-09-26 ENCOUNTER — Inpatient Hospital Stay (HOSPITAL_COMMUNITY)

## 2023-09-26 DIAGNOSIS — J9601 Acute respiratory failure with hypoxia: Secondary | ICD-10-CM | POA: Diagnosis not present

## 2023-09-26 LAB — CBC
HCT: 39 % (ref 39.0–52.0)
Hemoglobin: 12.4 g/dL — ABNORMAL LOW (ref 13.0–17.0)
MCH: 30.2 pg (ref 26.0–34.0)
MCHC: 31.8 g/dL (ref 30.0–36.0)
MCV: 94.9 fL (ref 80.0–100.0)
Platelets: 202 10*3/uL (ref 150–400)
RBC: 4.11 MIL/uL — ABNORMAL LOW (ref 4.22–5.81)
RDW: 14.5 % (ref 11.5–15.5)
WBC: 8.9 10*3/uL (ref 4.0–10.5)
nRBC: 0 % (ref 0.0–0.2)

## 2023-09-26 LAB — COMPREHENSIVE METABOLIC PANEL
ALT: 17 U/L (ref 0–44)
AST: 24 U/L (ref 15–41)
Albumin: 3.2 g/dL — ABNORMAL LOW (ref 3.5–5.0)
Alkaline Phosphatase: 85 U/L (ref 38–126)
Anion gap: 10 (ref 5–15)
BUN: 12 mg/dL (ref 8–23)
CO2: 26 mmol/L (ref 22–32)
Calcium: 8.1 mg/dL — ABNORMAL LOW (ref 8.9–10.3)
Chloride: 100 mmol/L (ref 98–111)
Creatinine, Ser: 0.65 mg/dL (ref 0.61–1.24)
GFR, Estimated: >60 mL/min (ref >60)
Glucose, Bld: 95 mg/dL (ref 70–99)
Potassium: 4 mmol/L (ref 3.5–5.1)
Sodium: 136 mmol/L (ref 135–145)
Total Bilirubin: 0.7 mg/dL (ref 0.0–1.2)
Total Protein: 6.7 g/dL (ref 6.5–8.1)

## 2023-09-26 LAB — HIV ANTIBODY (ROUTINE TESTING W REFLEX): HIV Screen 4th Generation wRfx: NONREACTIVE

## 2023-09-26 LAB — BRAIN NATRIURETIC PEPTIDE: B Natriuretic Peptide: 138.5 pg/mL — ABNORMAL HIGH (ref 0.0–100.0)

## 2023-09-26 MED ORDER — PANTOPRAZOLE SODIUM 20 MG PO TBEC
20.0000 mg | DELAYED_RELEASE_TABLET | Freq: Every day | ORAL | Status: DC
  Administered 2023-09-26 – 2023-09-28 (×3): 20 mg via ORAL
  Filled 2023-09-26 (×3): qty 1

## 2023-09-26 MED ORDER — METOPROLOL TARTRATE 25 MG PO TABS
12.5000 mg | ORAL_TABLET | Freq: Two times a day (BID) | ORAL | Status: DC
  Administered 2023-09-26 – 2023-09-28 (×5): 12.5 mg via ORAL
  Filled 2023-09-26 (×5): qty 1

## 2023-09-26 MED ORDER — TAMSULOSIN HCL 0.4 MG PO CAPS
0.4000 mg | ORAL_CAPSULE | Freq: Every day | ORAL | Status: DC
  Administered 2023-09-26 – 2023-09-28 (×3): 0.4 mg via ORAL
  Filled 2023-09-26 (×3): qty 1

## 2023-09-26 MED ORDER — APIXABAN 5 MG PO TABS
5.0000 mg | ORAL_TABLET | Freq: Two times a day (BID) | ORAL | Status: DC
  Administered 2023-09-26 – 2023-09-28 (×5): 5 mg via ORAL
  Filled 2023-09-26 (×5): qty 1

## 2023-09-26 MED ORDER — FUROSEMIDE 20 MG PO TABS: 20.0000 mg | ORAL_TABLET | Freq: Every day | ORAL | Status: DC | PRN

## 2023-09-26 MED ORDER — ALBUTEROL SULFATE (2.5 MG/3ML) 0.083% IN NEBU: 2.5000 mg | INHALATION_SOLUTION | RESPIRATORY_TRACT | Status: DC | PRN

## 2023-09-26 MED ORDER — SODIUM CHLORIDE 0.9 % IV BOLUS
500.0000 mL | Freq: Once | INTRAVENOUS | Status: AC
Start: 2023-09-26 — End: 2023-09-26
  Administered 2023-09-26: 500 mL via INTRAVENOUS

## 2023-09-26 MED ORDER — FUROSEMIDE 20 MG PO TABS
20.0000 mg | ORAL_TABLET | Freq: Every day | ORAL | Status: DC
Start: 2023-09-26 — End: 2023-09-28
  Administered 2023-09-26 – 2023-09-28 (×3): 20 mg via ORAL
  Filled 2023-09-26 (×3): qty 1

## 2023-09-26 MED ORDER — CARBAMAZEPINE 200 MG PO TABS: 600.0000 mg | ORAL_TABLET | Freq: Two times a day (BID) | ORAL | Status: DC

## 2023-09-26 MED ORDER — MORPHINE SULFATE (PF) 2 MG/ML IV SOLN
2.0000 mg | INTRAVENOUS | Status: DC | PRN
  Filled 2023-09-26: qty 1

## 2023-09-26 MED ORDER — IOHEXOL 350 MG/ML SOLN
75.0000 mL | Freq: Once | INTRAVENOUS | Status: AC | PRN
  Administered 2023-09-26: 75 mL via INTRAVENOUS

## 2023-09-26 MED ORDER — DULOXETINE HCL 30 MG PO CPEP: 30.0000 mg | ORAL_CAPSULE | Freq: Two times a day (BID) | ORAL | Status: DC

## 2023-09-26 MED ORDER — ACETAMINOPHEN 325 MG PO TABS
650.0000 mg | ORAL_TABLET | Freq: Four times a day (QID) | ORAL | Status: DC | PRN
  Administered 2023-09-26: 650 mg via ORAL
  Filled 2023-09-26: qty 2

## 2023-09-26 MED ORDER — ONDANSETRON HCL 4 MG PO TABS: 4.0000 mg | ORAL_TABLET | Freq: Four times a day (QID) | ORAL | Status: DC | PRN

## 2023-09-26 MED ORDER — IPRATROPIUM-ALBUTEROL 0.5-2.5 (3) MG/3ML IN SOLN
3.0000 mL | Freq: Four times a day (QID) | RESPIRATORY_TRACT | Status: DC
  Administered 2023-09-26 – 2023-09-27 (×3): 3 mL via RESPIRATORY_TRACT
  Filled 2023-09-26 (×3): qty 3

## 2023-09-26 MED ORDER — NICOTINE 21 MG/24HR TD PT24
21.0000 mg | MEDICATED_PATCH | Freq: Every day | TRANSDERMAL | Status: DC
  Administered 2023-09-26 – 2023-09-28 (×3): 21 mg via TRANSDERMAL
  Filled 2023-09-26 (×3): qty 1

## 2023-09-26 MED ORDER — DULOXETINE HCL 30 MG PO CPEP
30.0000 mg | ORAL_CAPSULE | Freq: Four times a day (QID) | ORAL | Status: DC
  Administered 2023-09-26 – 2023-09-28 (×8): 30 mg via ORAL
  Filled 2023-09-26 (×9): qty 1

## 2023-09-26 MED ORDER — SODIUM CHLORIDE 0.45 % IV SOLN: INTRAVENOUS | Status: DC

## 2023-09-26 MED ORDER — METHYLPREDNISOLONE SODIUM SUCC 40 MG IJ SOLR
40.0000 mg | Freq: Two times a day (BID) | INTRAMUSCULAR | Status: DC
  Administered 2023-09-26 – 2023-09-28 (×5): 40 mg via INTRAVENOUS
  Filled 2023-09-26 (×5): qty 1

## 2023-09-26 MED ORDER — OXCARBAZEPINE 300 MG PO TABS: 600.0000 mg | ORAL_TABLET | ORAL | Status: DC

## 2023-09-26 MED ORDER — IPRATROPIUM BROMIDE 0.02 % IN SOLN
0.5000 mg | Freq: Four times a day (QID) | RESPIRATORY_TRACT | Status: DC
  Administered 2023-09-26 (×2): 0.5 mg via RESPIRATORY_TRACT
  Filled 2023-09-26 (×2): qty 2.5

## 2023-09-26 MED ORDER — ONDANSETRON HCL 4 MG/2ML IJ SOLN: 4.0000 mg | Freq: Four times a day (QID) | INTRAMUSCULAR | Status: DC | PRN

## 2023-09-26 MED ORDER — CARBAMAZEPINE 200 MG PO TABS: 900.0000 mg | ORAL_TABLET | Freq: Every day | ORAL | Status: DC

## 2023-09-26 MED ORDER — ALBUTEROL SULFATE (2.5 MG/3ML) 0.083% IN NEBU
2.5000 mg | INHALATION_SOLUTION | Freq: Four times a day (QID) | RESPIRATORY_TRACT | Status: DC
  Administered 2023-09-26 (×2): 2.5 mg via RESPIRATORY_TRACT
  Filled 2023-09-26 (×2): qty 3

## 2023-09-26 NOTE — ED Notes (Signed)
 Patient had an episode of diarrhea walking to the bathroom. Patient cleaned up and new bed linen changed

## 2023-09-26 NOTE — Progress Notes (Signed)
 An order for code status of DNR was placed. This was verified in the chart as well as with the patient. A purple DNR bracelet was placed on the pt.

## 2023-09-26 NOTE — Progress Notes (Addendum)
 PROGRESS NOTE    Nicholas Estes  RUE:[id number]  DOB: 1961-03-14  DOA: 09/25/2023 PCP: [facility]:   Hospital course:  63 year old with HTN, COPD, A-fib on Eliquis, history of CVA and ongoing significant tobacco use was admitted last night for weakness and inability to get up after fall.  Workup was notable for new hypoxia and possible pulmonary edema on chest x-ray.  Respiratory panel is negative.  Subjective:  Patient states that he has been unwell since Nicholas 2020.  Notes that he is always "breathing heavy".  Does not know if he is short of breath.  Does admit that he has been feeling weak lately.  Admits that he does not get around much.  Notes that his caregiver has had the flu so has not been around for a while.  Denies any history of CAD as far as he is aware.  Patient states he did have episode of diarrhea this morning as was reported to me by the nurse.  Denies any diarrhea previously.  Patient also states that he is DNR and wants to make sure that that is documented in the chart.  Notes that his caregiver Nicholas Estes is his power of attorney.   Objective: Vitals:   09/26/23 1014 09/26/23 1100 09/26/23 1147 09/26/23 1200  BP: 93/68 113/71  114/69  Pulse: (!) 103 96  92  Resp: (!) 29 14  (!) 26  Temp:   97.9 F (36.6 C)   TempSrc:   Oral   SpO2: 92% 94%  93%  Weight:      Height:        Intake/Output Summary (Last 24 hours) at 09/26/2023 1312 Last data filed at 09/26/2023 1478 Gross per 24 hour  Intake 50 ml  Output 300 ml  Net -250 ml   Filed Weights   09/25/23 1421  Weight: 108.9 kg     Exam:  General: Obese patient looking much older than stated age with pursed lip bleeding and prolonged expiratory phase lying in bed at 30 degrees with mildly labored tachypnea. Eyes: sclera anicteric, conjuctiva mild injection bilaterally CVS: S1-S2, irregularly irregular Respiratory:  decreased air entry bilaterally  secondary to body habitus, no wheezing, possible crackles at left base GI: Obese, soft, nontender LE: Warm and well-perfused, trace edema Neuro: A/O x 3, decreased strength in left UE and LE Psych: patient with depressed affect  Data Reviewed:  Basic Metabolic Panel: Recent Labs  Lab 09/25/23 1837 09/26/23 0827  NA 135 136  K 3.7 4.0  CL 99 100  CO2 24 26  GLUCOSE 136* 95  BUN 10 12  CREATININE 0.80 0.65  CALCIUM 8.1* 8.1*    CBC: Recent Labs  Lab 09/25/23 1550 09/26/23 0827  WBC 5.9 8.9  NEUTROABS 5.0  --   HGB 13.0 12.4*  HCT 39.1 39.0  MCV 91.4 94.9  PLT 219 202     Scheduled Meds:  albuterol  2.5 mg Nebulization Q6H   apixaban  5 mg Oral BID   DULoxetine  30 mg Oral BID   ipratropium  0.5 mg Nebulization Q6H   methylPREDNISolone (SOLU-MEDROL) injection  40 mg Intravenous Q12H   nicotine  21 mg Transdermal Daily   pantoprazole  20 mg Oral Daily   rosuvastatin  20 mg Oral Daily   tamsulosin  0.4 mg Oral Daily   Continuous Infusions:     Assessment & Plan:   Acute hypoxic respiratory failure Likely multifactorial secondary to obstructive lung disease secondary  to COPD exacerbation, infiltrate of secondary to possible pulmonary edema and likely has some restrictive lung disease due to body habitus. Not on home O2 per patient and caregiver, now on 5 liters Continue steroids and inhaled bronchodilators as ordered Restart home doses of Lasix 20 mg daily which she takes as needed has not taken for a while Echocardiogram is ordered and pending, BNP minimally elevated 138 CTA is also ordered although patient is compliant with his Eliquis  Atrial fibrillation Patient was on propranolol at home however it is nonselective and given likely COPD, agree with changing Patient was started on carvedilol overnight however given hypotension, will change to metoprolol Continue Eliquis for secondary stroke prevention  HTN with relative hypotension in house Previously  on propranolol likely both for HTN and A-fib rate control BP is low today, patient had diarrhea As noted above, start low-dose metoprolol and titrate up as warranted  Depression Patient is apparently on duloxetine 30 4 times daily per med rec I have clarified that 120 mg a day is a reasonable max dose I have started him back on 30 4 times daily  H/o CVA Continue rosuvastatin  BPH Restart tamsulosin  GERD Restart pantoprazole  CODE STATUS As noted above, patient states that he is DNR, does not want to be resuscitated in the event of cardiac arrest and does not want to be intubated.  Notes "I just wanted to come in and out of this world quietly, you are not let me leave". DNR order updated Also discussed with patient's POA is Nicholas Estes who apparently coordinates care with his son who lives in [place] who confirmed that patient's CODE STATUS is DNR/DNI.    DVT prophylaxis: Eliquis Code Status: DNR Family Communication: spoke with Nicholas Estes, poa     Studies: DG Chest Port 1 View Result Date: 09/25/2023 CLINICAL DATA:  Questionable sepsis - evaluate for abnormality EXAM: PORTABLE CHEST 1 VIEW COMPARISON:  Nicholas 17, 2023 FINDINGS: The cardiomediastinal silhouette is unchanged and enlarged in contour. No pleural effusion. No pneumothorax. Perihilar vascular congestion with mild diffuse interstitial prominence. Mild peribronchial cuffing. IMPRESSION: Findings suggestive of pulmonary edema. Electronically Signed   By: Nicholas Estes M.D.   On: 09/25/2023 16:23    Principal Problem:   Acute hypoxemic respiratory failure (HCC) Active Problems:   Left-sided weakness   Paroxysmal atrial fibrillation (HCC)   Hypertension     Nicholas Estes Nicholas Estes, Nicholas Estes  If 7PM-7AM, please contact night-coverage [email or web address]   LOS: 1 day

## 2023-09-27 ENCOUNTER — Inpatient Hospital Stay (HOSPITAL_COMMUNITY)

## 2023-09-27 DIAGNOSIS — I5031 Acute diastolic (congestive) heart failure: Secondary | ICD-10-CM | POA: Diagnosis not present

## 2023-09-27 LAB — ECHOCARDIOGRAM COMPLETE
Height: 68 in
S' Lateral: 4 cm
Weight: 3950.64 [oz_av]

## 2023-09-27 MED ORDER — PERFLUTREN LIPID MICROSPHERE
1.0000 mL | INTRAVENOUS | Status: AC | PRN
  Administered 2023-09-27: 3 mL via INTRAVENOUS

## 2023-09-27 MED ORDER — IPRATROPIUM-ALBUTEROL 0.5-2.5 (3) MG/3ML IN SOLN
3.0000 mL | Freq: Three times a day (TID) | RESPIRATORY_TRACT | Status: DC
  Administered 2023-09-27 – 2023-09-28 (×3): 3 mL via RESPIRATORY_TRACT
  Filled 2023-09-27 (×4): qty 3

## 2023-09-27 NOTE — Plan of Care (Signed)

## 2023-09-27 NOTE — Progress Notes (Signed)
 Echocardiogram 2D Echocardiogram has been performed.  Leda Roys RDCS 09/27/2023, 2:31 PM

## 2023-09-27 NOTE — Progress Notes (Signed)
  Progress Note   Patient: Nicholas Estes QQV:[id number] DOB: 04-15-1961 DOA: 09/25/2023     2 DOS: the patient was seen and examined on 09/27/2023   Brief hospital course: 63 year old with HTN, COPD, A-fib on Eliquis, history of CVA and ongoing significant tobacco use was admitted last night for weakness and inability to get up after fall. Workup was notable for new hypoxia and possible pulmonary edema on chest x-ray.   Assessment and Plan:  Acute hypoxic respiratory failure - Currently on 3 L nasal cannula maintain O2 sats greater then 90%.  Will attempt to wean O2 as tolerated.  COPD exacerbation - Bilateral wheezing on presentation, hypoxia, dyspnea.  Rapid swab was negative.  CT scan noting possible left lower lobe patchiness with emphysema.  Continue IV Solu-Medrol, scheduled nebulizers.  Possible exacerbation of HFpEF - [place] contributing to patient's hypoxia.  Responding well to IV Lasix, diuresed greater than 2 L so far.  Continue diuretic therapy.  Monitor urine output.  Recheck BMP and magnesium in AM.  Hypertension - Slowly restarting patient's home medication regiment.  Depression 10 continue duloxetine.  BPH - Tamsulosin on board.  GERD - PPI on board.      Subjective: Patient resting comfortably this morning.  States he feels improved.  Still has nasal cannula in place.  Diuresing well.  Motivated to be discharged home but realizes he is still on supplemental O2.  Denies any chest pain, purulent sputum, nausea, vomiting, abdominal pain.  Physical Exam: Vitals:   09/27/23 0443 09/27/23 0500 09/27/23 0928 09/27/23 1244  BP: 123/80   (!) 127/90  Pulse: 86   71  Resp:    18  Temp:    98.2 F (36.8 C)  TempSrc:    Oral  SpO2:  96% 94% 96%  Weight:      Height:       GENERAL:  Alert, pleasant, no acute distress  HEENT:  EOMI, nasal cannula CARDIOVASCULAR:  RRR, no murmurs appreciated RESPIRATORY: Mild basilar crackles, trace expiratory wheezing  appreciated GASTROINTESTINAL:  Soft, nontender, nondistended EXTREMITIES:  No LE edema bilaterally NEURO:  No new focal deficits appreciated SKIN:  No rashes noted PSYCH:  Appropriate mood and affect   Data Reviewed:  There are no new results to review at this time.  Family Communication: None at bedside  Disposition: Status is: Inpatient Remains inpatient appropriate because: Acute hypoxic respiratory failure  Planned Discharge Destination: Home    Time spent: 36 minutes  Author: Deanna Artis, DO 09/27/2023 1:48 PM  For on call review [email or web address].

## 2023-09-28 LAB — CBC
HCT: 39.5 % (ref 39.0–52.0)
Hemoglobin: 12.6 g/dL — ABNORMAL LOW (ref 13.0–17.0)
MCH: 30.3 pg (ref 26.0–34.0)
MCHC: 31.9 g/dL (ref 30.0–36.0)
MCV: 95 fL (ref 80.0–100.0)
Platelets: 239 10*3/uL (ref 150–400)
RBC: 4.16 MIL/uL — ABNORMAL LOW (ref 4.22–5.81)
RDW: 14.6 % (ref 11.5–15.5)
WBC: 8.5 10*3/uL (ref 4.0–10.5)
nRBC: 0 % (ref 0.0–0.2)

## 2023-09-28 LAB — BASIC METABOLIC PANEL
Anion gap: 13 (ref 5–15)
BUN: 18 mg/dL (ref 8–23)
CO2: 27 mmol/L (ref 22–32)
Calcium: 8.5 mg/dL — ABNORMAL LOW (ref 8.9–10.3)
Chloride: 99 mmol/L (ref 98–111)
Creatinine, Ser: 0.71 mg/dL (ref 0.61–1.24)
GFR, Estimated: >60 mL/min (ref >60)
Glucose, Bld: 101 mg/dL — ABNORMAL HIGH (ref 70–99)
Potassium: 3.3 mmol/L — ABNORMAL LOW (ref 3.5–5.1)
Sodium: 139 mmol/L (ref 135–145)

## 2023-09-28 LAB — MAGNESIUM: Magnesium: 1.8 mg/dL (ref 1.7–2.4)

## 2023-09-28 MED ORDER — POTASSIUM CHLORIDE 20 MEQ PO PACK
40.0000 meq | PACK | Freq: Two times a day (BID) | ORAL | Status: DC
Start: 2023-09-28 — End: 2023-09-28
  Administered 2023-09-28: 40 meq via ORAL
  Filled 2023-09-28: qty 2

## 2023-09-28 MED ORDER — PREDNISONE 10 MG PO TABS: ORAL_TABLET | ORAL | 0 refills | Status: AC

## 2023-09-28 MED ORDER — FUROSEMIDE 20 MG PO TABS: 20.0000 mg | ORAL_TABLET | Freq: Every day | ORAL | 1 refills | Status: AC | PRN

## 2023-09-28 NOTE — Discharge Summary (Signed)
 Physician Discharge Summary   Patient: Nicholas Estes MRN: [id number] DOB: [birth year]  Admit date:     09/25/2023  Discharge date: 09/28/23  Discharge Physician: Deanna Artis   PCP: [facility]   Recommendations at discharge:   At this time patient will be discharged home.  If you experience any symptoms such as fever, vomiting, shortness of breath, chest pain, abdominal pain, or other concerning symptoms, please call your primary care provider or go to the emergency department immediately.  Discharge Diagnoses: Principal Problem:   Acute hypoxemic respiratory failure (HCC) Active Problems:   Left-sided weakness   Paroxysmal atrial fibrillation (HCC)   Hypertension  Resolved Problems:   * No resolved hospital problems. *  Hospital Course: 63 year old with HTN, COPD, A-fib on Eliquis, history of CVA and ongoing significant tobacco use was admitted last night for weakness and inability to get up after fall. Workup was notable for new hypoxia and possible pulmonary edema on chest x-ray.   Assessment and Plan:  Acute hypoxic respiratory failure - Initially requiring up to 3 L nasal cannula maintain O2 sats greater then 90%.  Throughout the course hospital stay was able to wean down to room air.  COPD exacerbation - Bilateral wheezing on presentation, hypoxia, dyspnea.  Rapid swab was negative.  CT scan noting possible left lower lobe patchiness with emphysema.  Received IV Solu-Medrol, scheduled nebulizers.  Improvement in dyspnea and wheezing.  Will transition patient over to p.o. prednisone taper to take as directed.  Resume home inhaler regiment.   Possible exacerbation of HFpEF - Likely contributing to patient's hypoxia.  Responded well to IV Lasix, diuresed greater than 2 L so far.  Discontinue IV diuretic therapy.  Patient can resume home cardiac regimen.  Will refill Lasix prescription.    Hypertension - Resume patient's home medication regiment.    Depression -Continue duloxetine.   BPH - Tamsulosin on board.   GERD - PPI on board.        Consultants: None Procedures performed: None Disposition: Home Diet recommendation:  Discharge Diet Orders (From admission, onward)     Start     Ordered   09/28/23 0000  Diet - low sodium heart healthy        09/28/23 1022           Cardiac diet DISCHARGE MEDICATION: Allergies as of 09/28/2023       Reactions   Bupropion Other (See Comments)   Caused swelling in the brain   Hydrochlorothiazide Other (See Comments)   Insomnia        Medication List     TAKE these medications    acetaminophen 500 MG tablet Commonly known as: TYLENOL Take 1,000 mg by mouth every 4 (four) hours as needed for mild pain (pain score 1-3) or moderate pain (pain score 4-6).   albuterol 108 (90 Base) MCG/ACT inhaler Commonly known as: VENTOLIN HFA Inhale 2 puffs into the lungs every 4 (four) hours as needed for wheezing or shortness of breath.   apixaban 5 MG Tabs tablet Commonly known as: ELIQUIS Take 1 tablet (5 mg total) by mouth 2 (two) times daily.   bisacodyl 5 MG EC tablet Commonly known as: DULCOLAX Take 5 mg by mouth in the morning and at bedtime.   DULoxetine 30 MG capsule Commonly known as: CYMBALTA Take 30 mg by mouth in the morning, at noon, in the evening, and at bedtime.   furosemide 20 MG tablet Commonly known as: LASIX Take 1 tablet (20  mg total) by mouth daily as needed for fluid or edema.   melatonin 3 MG Tabs tablet Take 3 mg by mouth at bedtime as needed (sleep).   Oxcarbazepine 300 MG tablet Commonly known as: TRILEPTAL Take 1 tablet (300 mg total) by mouth 2 (two) times daily. What changed:  how much to take when to take this additional instructions   pantoprazole 20 MG tablet Commonly known as: PROTONIX Take 20 mg by mouth daily.   predniSONE 10 MG tablet Commonly known as: DELTASONE Take 4 tablets (40 mg total) by mouth daily for 3 days,  THEN 3 tablets (30 mg total) daily for 3 days, THEN 2 tablets (20 mg total) daily for 3 days, THEN 1 tablet (10 mg total) daily for 3 days. Start taking on: September 28, 2023   propranolol 20 MG tablet Commonly known as: INDERAL Take 20 mg by mouth 2 (two) times daily.   rosuvastatin 40 MG tablet Commonly known as: CRESTOR Take 20 mg by mouth at bedtime.   tamsulosin 0.4 MG Caps capsule Commonly known as: FLOMAX Take 1 capsule by mouth daily.   Vitamin D (Ergocalciferol) 1.25 MG (50000 UNIT) Caps capsule Commonly known as: DRISDOL Take 50,000 Units by mouth every 7 (seven) days.        Discharge Exam: Filed Weights   09/25/23 1421 09/26/23 1435  Weight: 108.9 kg 112 kg   GENERAL:  Alert, pleasant, no acute distress  HEENT:  EOMI CARDIOVASCULAR:  RRR, no murmurs appreciated RESPIRATORY: trace expiratory wheezing appreciated GASTROINTESTINAL:  Soft, nontender, nondistended EXTREMITIES:  No LE edema bilaterally NEURO:  No new focal deficits appreciated SKIN:  No rashes noted PSYCH:  Appropriate mood and affect   Condition at discharge: improving  The results of significant diagnostics from this hospitalization (including imaging, microbiology, ancillary and laboratory) are listed below for reference.   Imaging Studies: ECHOCARDIOGRAM COMPLETE Result Date: 09/27/2023    ECHOCARDIOGRAM REPORT   Patient Name:   Nicholas Estes Date of Exam: 09/27/2023 Medical Rec #:  [id number]     Height:       68.0 in Accession #:    [id number]    Weight:       246.9 lb Date of Birth:  1960-10-11    BSA:          2.235 m Patient Age:    62 years      BP:           123/80 mmHg Patient Gender: M             HR:           108 bpm. Exam Location:  Inpatient Procedure: 2D Echo, Cardiac Doppler, Color Doppler and Intracardiac            Opacification Agent (Both Spectral and Color Flow Doppler were            utilized during procedure). Indications:    CHF- Acute Diastolic I50.31  History:        Patient  has prior history of Echocardiogram examinations, most                 recent 11/05/2019. COPD and Stroke; Arrythmias:Atrial                 Fibrillation.  Sonographer:    Harriette Bouillon RDCS Referring Phys: [id number] SROBONA TUBLU CHATTERJEE IMPRESSIONS  1. Left ventricular ejection fraction, by estimation, is 55 to 60%. The left ventricle has normal function. The left ventricle  has no regional wall motion abnormalities. There is mild concentric left ventricular hypertrophy. Left ventricular diastolic parameters are indeterminate.  2. Right ventricular systolic function is normal. The right ventricular size is not well visualized. Tricuspid regurgitation signal is inadequate for assessing PA pressure.  3. Left atrial size was moderately dilated.  4. Right atrial size was moderately dilated.  5. The mitral valve was not well visualized. Mild mitral valve regurgitation. No evidence of mitral stenosis.  6. The aortic valve is tricuspid. There is mild calcification of the aortic valve. Aortic valve regurgitation is not visualized. No aortic stenosis is present.  7. IVC not well-visualized.  8. The patient was in atrial fibrillation. FINDINGS  Left Ventricle: Left ventricular ejection fraction, by estimation, is 55 to 60%. The left ventricle has normal function. The left ventricle has no regional wall motion abnormalities. Definity contrast agent was given IV to delineate the left ventricular  endocardial borders. The left ventricular internal cavity size was normal in size. There is mild concentric left ventricular hypertrophy. Left ventricular diastolic parameters are indeterminate. Right Ventricle: The right ventricular size is not well visualized. Right vetricular wall thickness was not well visualized. Right ventricular systolic function is normal. Tricuspid regurgitation signal is inadequate for assessing PA pressure. Left Atrium: Left atrial size was moderately dilated. Right Atrium: Right atrial size was moderately  dilated. Pericardium: There is no evidence of pericardial effusion. Mitral Valve: The mitral valve was not well visualized. Mild mitral valve regurgitation. No evidence of mitral valve stenosis. Tricuspid Valve: The tricuspid valve is normal in structure. Tricuspid valve regurgitation is trivial. Aortic Valve: The aortic valve is tricuspid. There is mild calcification of the aortic valve. Aortic valve regurgitation is not visualized. No aortic stenosis is present. Pulmonic Valve: The pulmonic valve was normal in structure. Pulmonic valve regurgitation is not visualized. Aorta: The aortic root is normal in size and structure. Venous: IVC not well-visualized. The inferior vena cava was not well visualized. IAS/Shunts: No atrial level shunt detected by color flow Doppler.  LEFT VENTRICLE PLAX 2D LVIDd:         5.20 cm   Diastology LVIDs:         4.00 cm   LV e' medial: 11.90 cm/s LV PW:         1.30 cm LV IVS:        1.30 cm LVOT diam:     2.30 cm LV SV:         64 LV SV Index:   29 LVOT Area:     4.15 cm  RIGHT VENTRICLE         IVC TAPSE (M-mode): 2.3 cm  IVC diam: 1.40 cm LEFT ATRIUM            Index        RIGHT ATRIUM           Index LA diam:      4.80 cm  2.15 cm/m   RA Area:     26.40 cm LA Vol (A4C): 114.0 ml 51.00 ml/m  RA Volume:   87.80 ml  39.28 ml/m  AORTIC VALVE LVOT Vmax:   104.00 cm/s LVOT Vmean:  69.000 cm/s LVOT VTI:    0.155 m  AORTA Ao Root diam: 3.70 cm Ao Asc diam:  3.70 cm  SHUNTS Systemic VTI:  0.16 m Systemic Diam: 2.30 cm Dalton McleanMD Electronically signed by Wilfred Lacy Signature Date/Time: 09/27/2023/2:53:00 PM    Final    CT Angio Chest  Pulmonary Embolism (PE) W or WO Contrast Result Date: 09/26/2023 CLINICAL DATA:  Shortness of breath.  Smoker. EXAM: CT ANGIOGRAPHY CHEST WITH CONTRAST TECHNIQUE: Multidetector CT imaging of the chest was performed using the standard protocol during bolus administration of intravenous contrast. Multiplanar CT image reconstructions and MIPs were  obtained to evaluate the vascular anatomy. RADIATION DOSE REDUCTION: This exam was performed according to the departmental dose-optimization program which includes automated exposure control, adjustment of the mA and/or kV according to patient size and/or use of iterative reconstruction technique. CONTRAST:  75mL OMNIPAQUE IOHEXOL 350 MG/ML SOLN COMPARISON:  Radiograph yesterday.  Chest CT 02/14/2020 FINDINGS: Cardiovascular: Moderate motion artifact limitations. Allowing for this, no evidence of pulmonary embolus. The heart is mildly enlarged. There is no pericardial effusion. Contrast refluxes into the hepatic veins and IVC. Aortic atherosclerosis. No aortic aneurysm. Mediastinum/Nodes: 12 mm prevascular node series 5, image 39, previously 10 mm. Additional shoddy mediastinal lymph nodes. 14 mm right hilar node series 5, image 43. 13 mm right hilar node series 5, image 52. Decompressed esophagus. Lungs/Pleura: Moderate motion artifact limitations. Mild to moderate emphysema. Prominent bronchial thickening. Patchy ground-glass opacity in the periphery of the lingula and left lower lobe. No pleural effusion. Upper Abdomen: Contrast refluxes into the hepatic veins and IVC. Musculoskeletal: Thoracic spondylosis. There are no acute or suspicious osseous abnormalities. No chest wall soft tissue abnormalities. Review of the MIP images confirms the above findings. IMPRESSION: 1. Moderate motion artifact limitations. Allowing for this, no evidence of pulmonary embolus. 2. Patchy ground-glass opacity in the periphery of the lingula and left lower lobe, suspicious for pneumonia. 3. Mild cardiomegaly. Contrast refluxes into the hepatic veins and IVC suggesting right heart dysfunction. 4. Mildly enlarged mediastinal and right hilar lymph nodes, likely reactive. 5. Emphysema with bronchial thickening. Aortic Atherosclerosis (ICD10-I70.0) and Emphysema (ICD10-J43.9). Electronically Signed   By: Narda Rutherford M.D.   On:  09/26/2023 18:30   DG Chest Port 1 View Result Date: 09/25/2023 CLINICAL DATA:  Questionable sepsis - evaluate for abnormality EXAM: PORTABLE CHEST 1 VIEW COMPARISON:  November 03, 2021 FINDINGS: The cardiomediastinal silhouette is unchanged and enlarged in contour. No pleural effusion. No pneumothorax. Perihilar vascular congestion with mild diffuse interstitial prominence. Mild peribronchial cuffing. IMPRESSION: Findings suggestive of pulmonary edema. Electronically Signed   By: Meda Klinefelter M.D.   On: 09/25/2023 16:23    Microbiology: Results for orders placed or performed during the hospital encounter of 09/25/23  Blood Culture (routine x 2)     Status: None (Preliminary result)   Collection Time: 09/25/23  3:50 PM   Specimen: BLOOD  Result Value Ref Range Status   Specimen Description   Final    BLOOD SITE NOT SPECIFIED Performed at [facility], [address], [place], [place] [address]    Special Requests   Final    BOTTLES DRAWN AEROBIC AND ANAEROBIC Blood Culture results may not be optimal due to an inadequate volume of blood received in culture bottles Performed at [facility], [address], [place], [place] [address]    Culture   Final    NO GROWTH 3 DAYS Performed at [facility], [address], [place], [place] [address]    Report Status PENDING  Incomplete  Blood Culture (routine x 2)     Status: None (Preliminary result)   Collection Time: 09/25/23  3:53 PM   Specimen: BLOOD RIGHT HAND  Result Value Ref Range Status   Specimen Description  Final    BLOOD RIGHT HAND Performed at [facility], [address], [place], [place] [address]    Special Requests   Final    BOTTLES DRAWN AEROBIC AND ANAEROBIC Blood Culture results may not be optimal due to an inadequate volume of blood received in culture bottles Performed at [facility], [address] [place], [place] [address]    Culture    Final    NO GROWTH 3 DAYS Performed at [facility], [address] [place], [place] [address]    Report Status PENDING  Incomplete  Resp panel by RT-PCR (RSV, Flu A&B, Covid) Anterior Nasal Swab     Status: None   Collection Time: 09/25/23  5:00 PM   Specimen: Anterior Nasal Swab  Result Value Ref Range Status   SARS Coronavirus 2 by RT PCR NEGATIVE NEGATIVE Final    Comment: (NOTE) SARS-CoV-2 target nucleic acids are NOT DETECTED.  The SARS-CoV-2 RNA is generally detectable in upper respiratory specimens during the acute phase of infection. The lowest concentration of SARS-CoV-2 viral copies this assay can detect is 138 copies/mL. A negative result does not preclude SARS-Cov-2 infection and should not be used as the sole basis for treatment or other patient management decisions. A negative result may occur with  improper specimen collection/handling, submission of specimen other than nasopharyngeal swab, presence of viral mutation(s) within the areas targeted by this assay, and inadequate number of viral copies(<138 copies/mL). A negative result must be combined with clinical observations, patient history, and epidemiological information. The expected result is Negative.  Fact Sheet for Patients:  BloggerCourse.com  Fact Sheet for Healthcare Providers:  SeriousBroker.it  This test is no t yet approved or cleared by the [place] FDA and  has been authorized for detection and/or diagnosis of SARS-CoV-2 by FDA under an Emergency Use Authorization (EUA). This EUA will remain  in effect (meaning this test can be used) for the duration of the COVID-19 declaration under Section 564(b)(1) of the Act, 21 U.S.C.section 360bbb-3(b)(1), unless the authorization is terminated  or revoked sooner.       Influenza A by PCR NEGATIVE NEGATIVE Final   Influenza B by PCR NEGATIVE NEGATIVE Final    Comment: (NOTE) The Xpert Xpress  SARS-CoV-2/FLU/RSV plus assay is intended as an aid in the diagnosis of influenza from Nasopharyngeal swab specimens and should not be used as a sole basis for treatment. Nasal washings and aspirates are unacceptable for Xpert Xpress SARS-CoV-2/FLU/RSV testing.  Fact Sheet for Patients: BloggerCourse.com  Fact Sheet for Healthcare Providers: SeriousBroker.it  This test is not yet approved or cleared by the [place] FDA and has been authorized for detection and/or diagnosis of SARS-CoV-2 by FDA under an Emergency Use Authorization (EUA). This EUA will remain in effect (meaning this test can be used) for the duration of the COVID-19 declaration under Section 564(b)(1) of the Act, 21 U.S.C. section 360bbb-3(b)(1), unless the authorization is terminated or revoked.     Resp Syncytial Virus by PCR NEGATIVE NEGATIVE Final    Comment: (NOTE) Fact Sheet for Patients: BloggerCourse.com  Fact Sheet for Healthcare Providers: SeriousBroker.it  This test is not yet approved or cleared by the [place] FDA and has been authorized for detection and/or diagnosis of SARS-CoV-2 by FDA under an Emergency Use Authorization (EUA). This EUA will remain in effect (meaning this test can be used) for the duration of the COVID-19 declaration under Section 564(b)(1) of the Act, 21 U.S.C. section  360bbb-3(b)(1), unless the authorization is terminated or revoked.  Performed at [facility], [address] [place], [place] [address]     Labs: CBC: Recent Labs  Lab 09/25/23 1550 09/26/23 0827 09/28/23 0352  WBC 5.9 8.9 8.5  NEUTROABS 5.0  --   --   HGB 13.0 12.4* 12.6*  HCT 39.1 39.0 39.5  MCV 91.4 94.9 95.0  PLT 219 202 239   Basic Metabolic Panel: Recent Labs  Lab 09/25/23 1837 09/26/23 0827 09/28/23 0352  NA 135 136 139  K 3.7 4.0 3.3*  CL 99 100 99  CO2  24 26 27   GLUCOSE 136* 95 101*  BUN 10 12 18   CREATININE 0.80 0.65 0.71  CALCIUM 8.1* 8.1* 8.5*  MG  --   --  1.8   Liver Function Tests: Recent Labs  Lab 09/25/23 1837 09/26/23 0827  AST 29 24  ALT 16 17  ALKPHOS 91 85  BILITOT 0.9 0.7  PROT 6.9 6.7  ALBUMIN 3.4* 3.2*   CBG: No results for input(s): "GLUCAP" in the last 168 hours.  Discharge time spent: less than 30 minutes.  Signed: Deanna Artis, DO [facility] 09/28/2023

## 2023-09-28 NOTE — Progress Notes (Signed)
 AVS reviewed w/ pt who verbalized an understanding- No other questions at this time. PIV removed as noted. Pt dressing for d/c to home- Pt states a friend will be picking him up at 1230

## 2023-09-30 LAB — CULTURE, BLOOD (ROUTINE X 2): Culture: NO GROWTH

## 2023-10-16 ENCOUNTER — Ambulatory Visit (INDEPENDENT_AMBULATORY_CARE_PROVIDER_SITE_OTHER)
Admit: 2023-10-16 | Discharge: 2023-10-16 | Disposition: A | Discharge status: Home / Self Care | Attending: Family Medicine | Admitting: Family Medicine

## 2023-10-16 ENCOUNTER — Ambulatory Visit (HOSPITAL_BASED_OUTPATIENT_CLINIC_OR_DEPARTMENT_OTHER)
Admission: EM | Admit: 2023-10-16 | Discharge: 2023-10-16 | Disposition: A | Discharge status: Home / Self Care | Attending: Family Medicine | Admitting: Family Medicine

## 2023-10-16 ENCOUNTER — Encounter (HOSPITAL_BASED_OUTPATIENT_CLINIC_OR_DEPARTMENT_OTHER): Payer: Self-pay

## 2023-10-16 DIAGNOSIS — W19XXXA Unspecified fall, initial encounter: Secondary | ICD-10-CM

## 2023-10-16 DIAGNOSIS — S6991XA Unspecified injury of right wrist, hand and finger(s), initial encounter: Secondary | ICD-10-CM

## 2023-10-16 DIAGNOSIS — M79641 Pain in right hand: Secondary | ICD-10-CM | POA: Diagnosis not present

## 2023-10-16 NOTE — Discharge Instructions (Signed)
 I am not seeing any obvious on your x-ray. We will Ace wrap the area for support.  Recommend icing 2-3 times a day for 15 to 20 minutes at a time.  You can take ibuprofen and Tylenol for pain as needed.  I will call in the morning if any changes on official read from the radiologist.

## 2023-10-16 NOTE — ED Triage Notes (Signed)
 Fell last night. Injured right wrist/hand. Visiting nurse suggested x-rays. +swelling.  Patient on blood thinners

## 2023-10-17 NOTE — ED Provider Notes (Signed)
 [facility]    CSN: [id number] Arrival date & time: 10/16/23  1454      History   Chief Complaint Chief Complaint  Patient presents with   Hand Pain    HPI Nicholas Estes is a 63 y.o. male.   63 year old male comes in today with right hand injury, swelling.  Reporting had a fall last night.  Denies hitting head in the fall.  He has swelling, limited right hand with bruising.  He is currently on blood thinners.  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 Authorizing Provider  acetaminophen (TYLENOL) 500 MG tablet Take 1,000 mg by mouth every 4 (four) hours as needed for mild pain (pain score 1-3) or moderate pain (pain score 4-6).    [provider]  albuterol (VENTOLIN HFA) 108 (90 Base) MCG/ACT inhaler Inhale 2 puffs into the lungs  every 4 (four) hours as needed for wheezing or shortness of breath.    [provider]  apixaban (ELIQUIS) 5 MG TABS tablet Take 1 tablet (5 mg total) by mouth 2 (two) times daily. 11/15/19   Metzger-Cihelka, Cristie Hem, NP  bisacodyl (DULCOLAX) 5 MG EC tablet Take 5 mg by mouth in the morning and at bedtime.    [provider]  DULoxetine (CYMBALTA) 30 MG capsule Take 30 mg by mouth in the morning, at noon, in the evening, and at bedtime.    [provider]  furosemide (LASIX) 20 MG tablet Take 1 tablet (20 mg total) by mouth daily as needed for fluid or edema. 09/28/23   Deanna Artis, DO  melatonin 3 MG TABS tablet Take 3 mg by mouth at bedtime as needed (sleep).    [provider]  Oxcarbazepine (TRILEPTAL) 300 MG tablet Take 1 tablet (300 mg total) by mouth 2 (two) times daily. Patient taking differently: Take 600-900 mg by mouth See admin instructions. Take 600 mg by mouth twice daily. Take 900 mg by mouth at bedtime. 11/11/21   Alicia Amel, MD  pantoprazole (PROTONIX) 20 MG tablet Take 20 mg by mouth daily.    [provider]  propranolol (INDERAL) 20 MG tablet Take 20 mg by mouth 2 (two) times daily.  [provider]  rosuvastatin (CRESTOR) 40 MG tablet Take 20 mg by mouth at bedtime.    [provider]  tamsulosin (FLOMAX) 0.4 MG CAPS capsule Take 1 capsule by mouth daily. 07/11/21   [provider]  Vitamin D, Ergocalciferol, (DRISDOL) 1.25 MG (50000 UNIT) CAPS capsule Take 50,000 Units by mouth every 7 (seven) days.    [provider]    Family History History reviewed. No pertinent family history.  Social History Social History   Tobacco Use   Smoking status: Every Day    Current packs/day: 1.50    Average packs/day: 1.5 packs/day for 35.2 years (52.9 ttl pk-yrs)    Types: Cigarettes    Start date: 66  Vaping Use   Vaping status: Never Used  Substance Use Topics   Alcohol use: Not  Currently   Drug use: Not Currently     Allergies   Bupropion and Hydrochlorothiazide   Review of Systems Review of Systems   Physical Exam Triage Vital Signs ED Triage Vitals  Encounter Vitals Group     BP 10/16/23 1514 111/64     Systolic BP Percentile --      Diastolic BP Percentile --      Pulse Rate 10/16/23 1514 97     Resp 10/16/23 1514 (!) 22     Temp 10/16/23 1514 97.6 F (36.4 C)     Temp src --      SpO2 10/16/23 1514 92 %     Weight --      Height --      Head Circumference --      Peak Flow --      Pain Score 10/16/23 1516 7     Pain Loc --      Pain Education --      Exclude from Growth Chart --    No data found.  Updated Vital Signs BP 111/64 (BP Location: Left Arm)   Pulse 97   Temp 97.6 F (36.4 C)   Resp (!) 22   SpO2 92%   Visual Acuity Right Eye Distance:   Left Eye Distance:   Bilateral Distance:    Right Eye Near:   Left Eye Near:    Bilateral Near:     Physical Exam Pulmonary:     Effort: Pulmonary effort is normal.  Musculoskeletal:     Right hand: Swelling, tenderness and bony tenderness present. Decreased range of motion. Normal pulse.  Neurological:     Mental Status: He is alert.      UC Treatments / Results  Labs (all labs ordered are listed, but only abnormal results are displayed) Labs Reviewed - No data to display  EKG   Radiology DG Wrist Complete Right Result Date: 10/16/2023 CLINICAL DATA:  fall EXAM: RIGHT WRIST - COMPLETE 3+ VIEW; RIGHT HAND - COMPLETE 3+ VIEW COMPARISON:  None Available. FINDINGS: Osteopenia. There is an oblique minimally displaced fracture of the fifth metacarpal with adjacent soft tissue edema. Scattered degenerative changes of the DIPs. no area of erosion or osseous destruction. No unexpected radiopaque foreign body. Soft tissue edema of the distal fifth phalanx. IMPRESSION: Oblique minimally displaced fracture of the fifth metacarpal. Electronically Signed   By: Meda Klinefelter  M.D.   On: 10/16/2023 16:22   DG Hand Complete Right Result Date: 10/16/2023 CLINICAL DATA:  fall EXAM: RIGHT WRIST - COMPLETE 3+ VIEW; RIGHT HAND - COMPLETE 3+ VIEW COMPARISON:  None Available. FINDINGS: Osteopenia. There is an oblique minimally displaced  fracture of the fifth metacarpal with adjacent soft tissue edema. Scattered degenerative changes of the DIPs. no area of erosion or osseous destruction. No unexpected radiopaque foreign body. Soft tissue edema of the distal fifth phalanx. IMPRESSION: Oblique minimally displaced fracture of the fifth metacarpal. Electronically Signed   By: Meda Klinefelter M.D.   On: 10/16/2023 16:22    Procedures Procedures (including critical care time)  Medications Ordered in UC Medications - No data to display  Initial Impression / Assessment and Plan / UC Course  I have reviewed the triage vital signs and the nursing notes.  Pertinent labs & imaging results that were available during my care of the patient were reviewed by me and considered in my medical decision making (see chart for details).     Right hand injury-official x-ray read fifth metacarpal fracture. Will call patient today with official results. Ace wrap applied prior to leaving for stabilization Recommend ice, ibuprofen and Tylenol for pain as needed For send to Ortho for follow-up Final Clinical Impressions(s) / UC Diagnoses   Final diagnoses:  Fall, initial encounter  Injury of right hand, initial encounter     Discharge Instructions      I am not seeing any obvious on your x-ray. We will Ace wrap the area for support.  Recommend icing 2-3 times a day for 15 to 20 minutes at a time.  You can take ibuprofen and Tylenol for pain as needed.  I will call in the morning if any changes on official read from the radiologist.      ED Prescriptions   None    PDMP not reviewed this encounter.   Janace Aris, FNP 10/17/23 [phone]

## 2023-11-26 IMAGING — DX DG CHEST 1V PORT
1 series · 1 of 1 positions shown · non-contrast
Comparison: 10/31/2021 and CT chest 02/14/2020.

CLINICAL DATA: Altered mental status.

EXAM:
PORTABLE CHEST 1 VIEW

[chest]
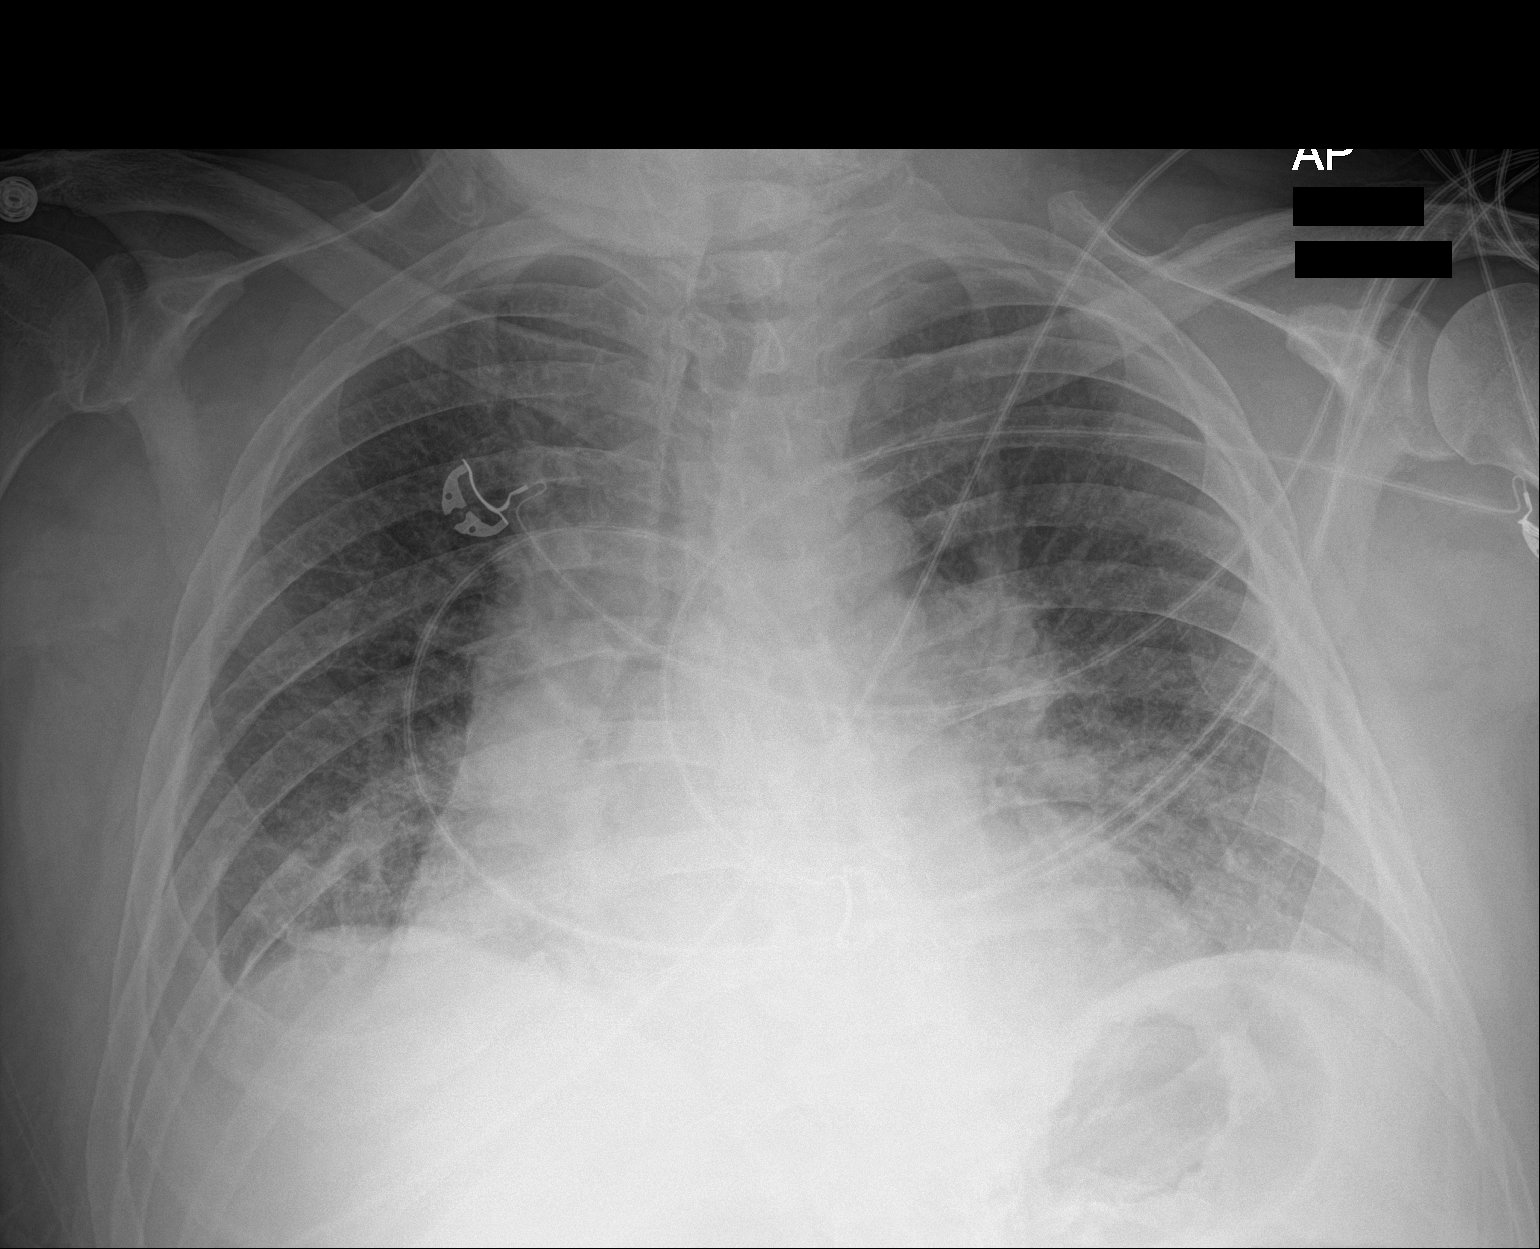

[1 of 1 positions shown; findings below may reference images not displayed]

FINDINGS: Interval extubation. Heart is enlarged, as before. Interstitial
prominence and indistinctness, increased from 10/31/2021. Small
right pleural effusion.
IMPRESSION: Congestive heart failure.

## 2023-11-28 IMAGING — CT CT ABD-PELV W/O CM
2 of 4 series · 15 of 46 positions shown, 17 images · non-contrast
Comparison: CT with oral contrast only recently 11/03/2021, CTA
chest, abdomen and pelvis 02/14/2020.

CLINICAL DATA: Right lower quadrant pain with bilateral ureteral
stones on recent CT.



[Series 4: a/p w/o 5mm · axial · non-contrast · 0.90mm/px · z∈[+917,+1357]mm · 12 of 102 slices shown, 14 images]
[im 9/102  soft-tissue]
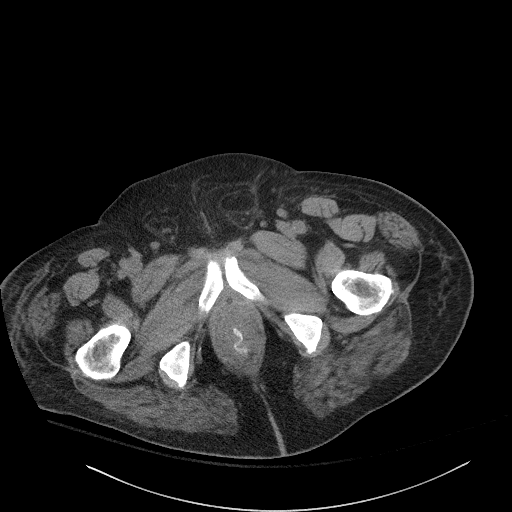
[im 9/102  bone]
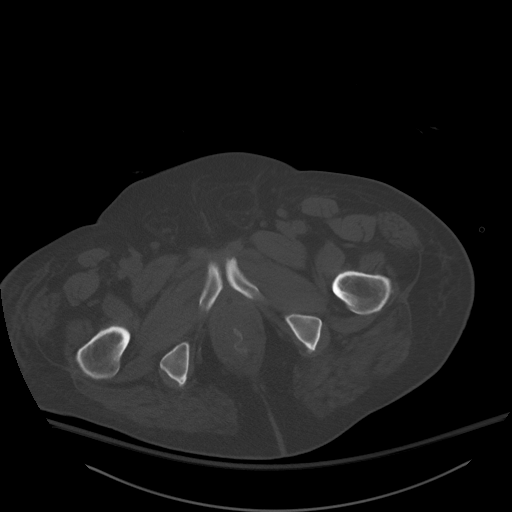
[im 17/102  soft-tissue]
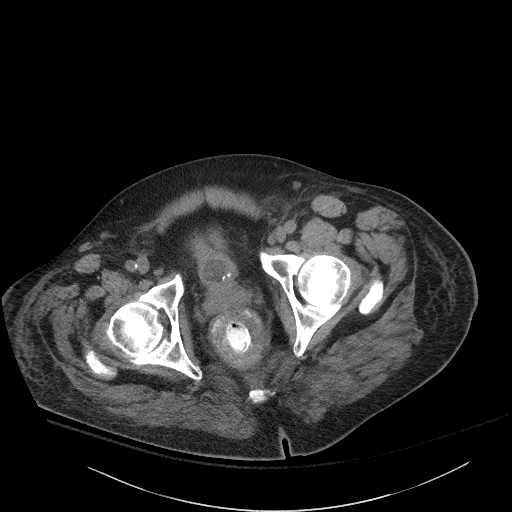
[im 25/102  soft-tissue]
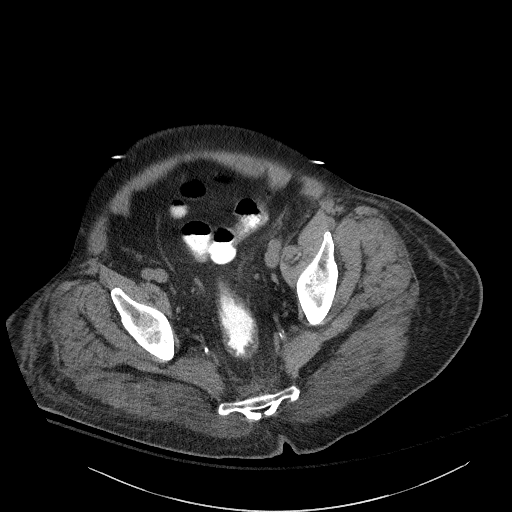
[im 33/102  soft-tissue]
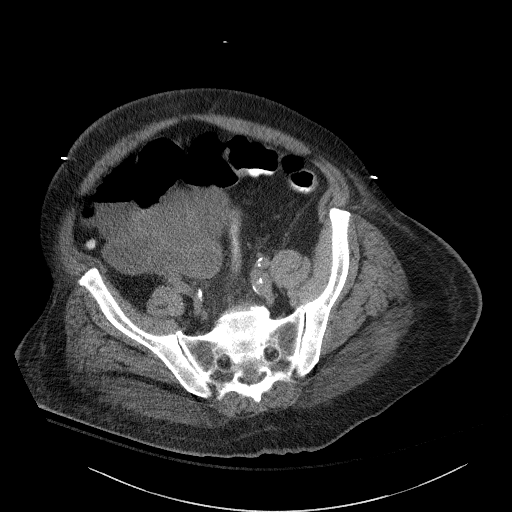
[im 41/102  soft-tissue]
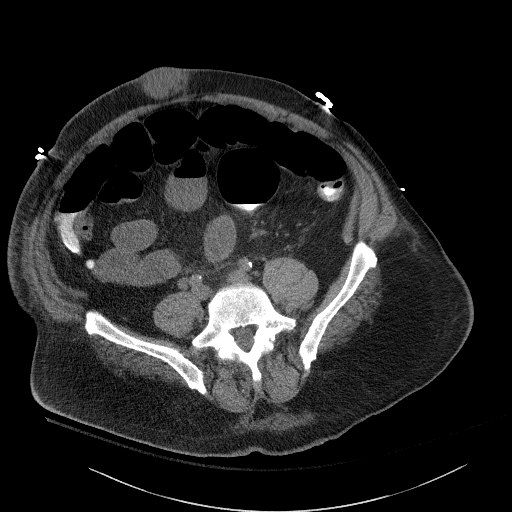
[im 49/102  soft-tissue]
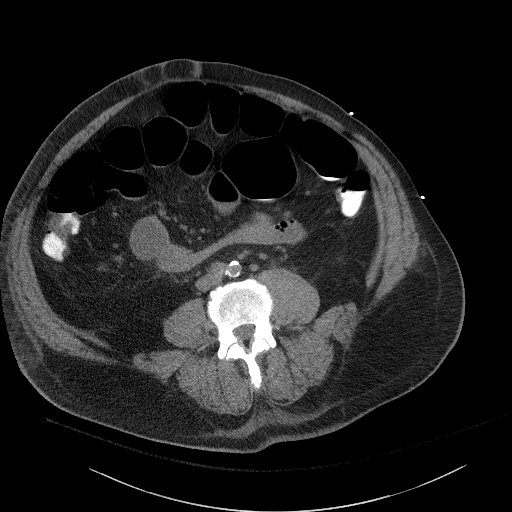
[im 57/102  soft-tissue]
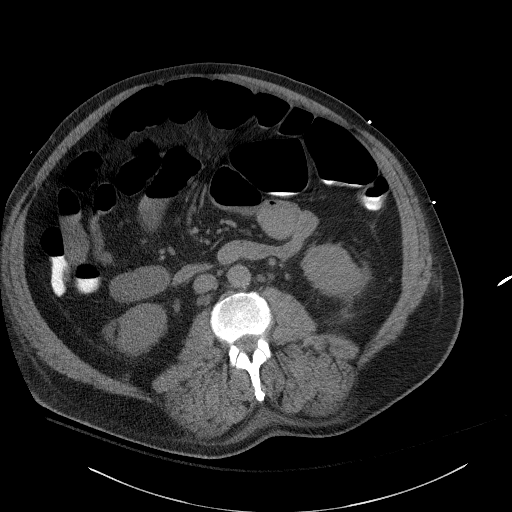
[im 65/102  soft-tissue]
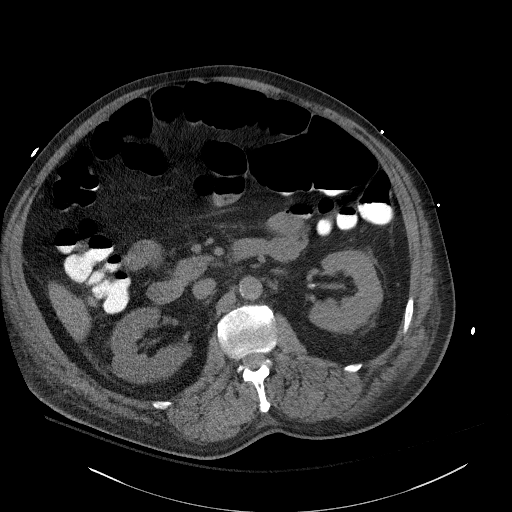
[im 73/102  soft-tissue]
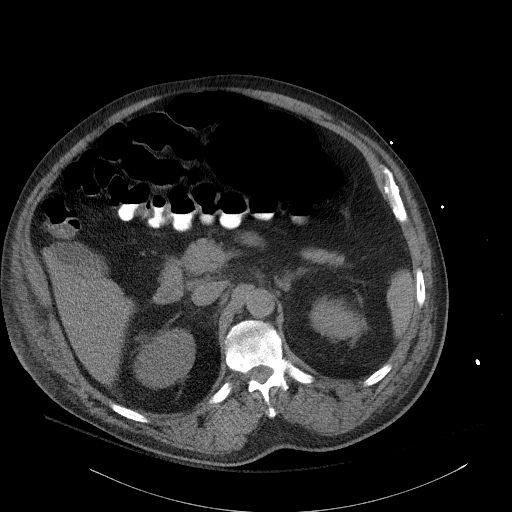
[im 73/102  bone]
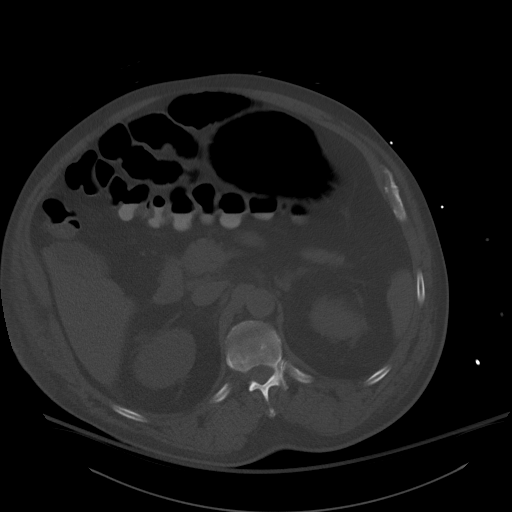
[im 81/102  soft-tissue]
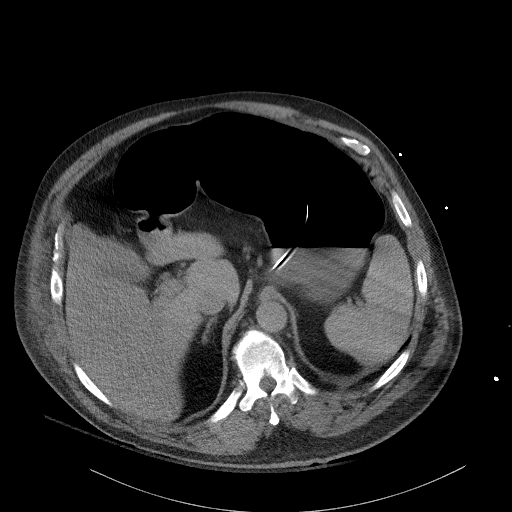
[im 89/102  soft-tissue]
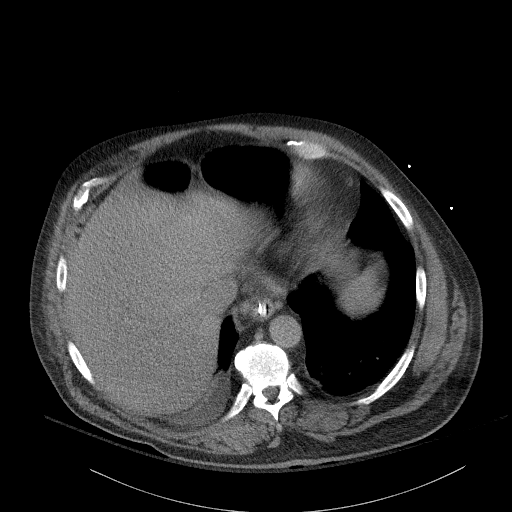
[im 97/102  soft-tissue]
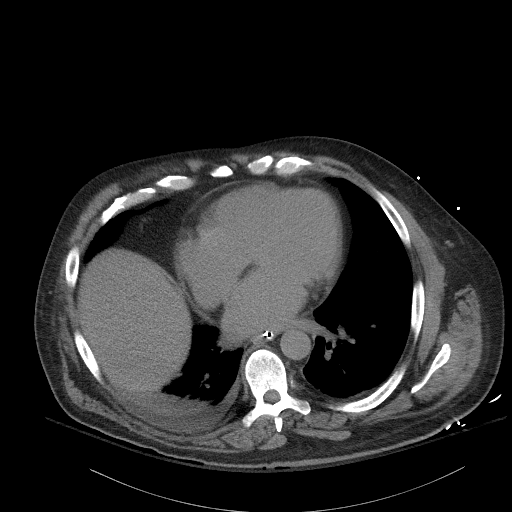

[Series 7: a/p w/o cor · coronal · non-contrast · 0.94mm/px · 3 of 194 slices shown]
[im 65/194  soft-tissue]
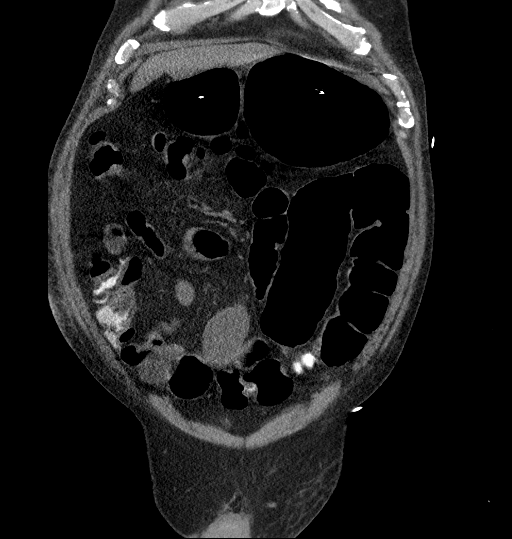
[im 86/194  soft-tissue]
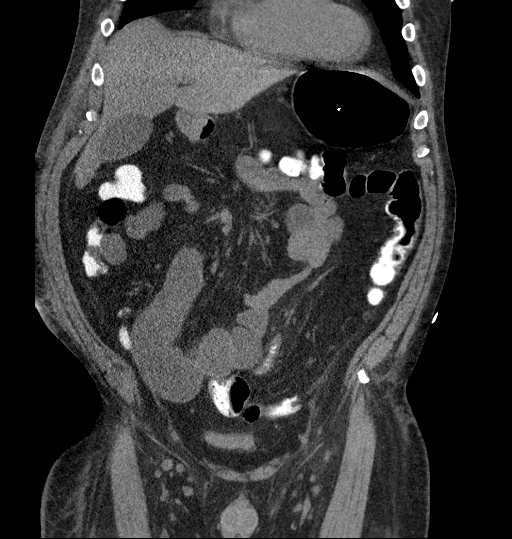
[im 108/194  soft-tissue]
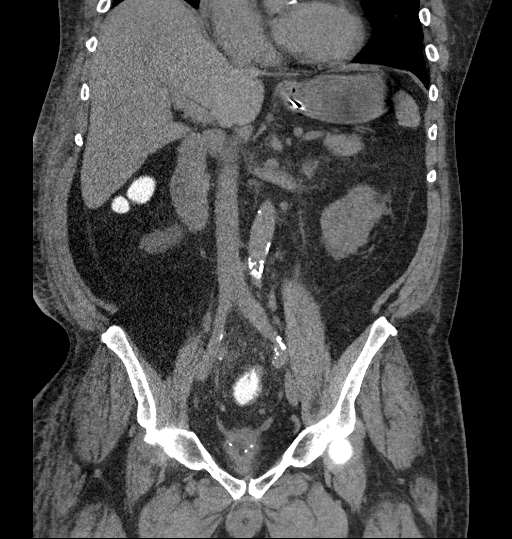

[15 of 46 positions shown; findings below may reference images not displayed]

FINDINGS: Lower chest: There are small right-greater-than-left pleural
effusions again noted with adjacent consolidation or atelectasis in
the lower lobes, asymmetric elevation right hemidiaphragm.

The heart is slightly enlarged. Small pericardial effusion is
unchanged.

Hepatobiliary: 21.5 cm in length mildly steatotic liver, no focal
abnormality without contrast. Gallbladder and bile ducts are
unremarkable.

Pancreas: There are scattered punctate calcifications suggesting
chronic calcific pancreatitis. No acute pancreatitis is seen.
Pancreas otherwise unremarkable without contrast.

Spleen: Unremarkable without contrast.

Adrenals/Urinary Tract: There is no adrenal mass no focal
abnormality in the renal cortex without contrast.

On the right there are punctate up to 9 mm nonobstructing caliceal
stones on the left, punctate up to 5 mm nonobstructive caliceal
stones.

9 mm nonobstructive left renal pelvis stone is again noted in the
same location as previously with a 5 mm stone just distal to this
which was previously in the proximal left ureter at the L3-4 level,
now higher in position at just below the level of L1-2.

At the upper L3 level, 3.5 mm proximal right ureteral stone
continues to be noted without upstream hydronephrosis.

Both ureters are otherwise clear. Bilateral perinephric stranding
appears similar. There are multiple small stones in the bladder up
to 3 mm in size as seen previously. The bladder is catheterized
contracted and otherwise not well seen.

Stomach/Bowel: NGT has its tip in the gastric antrum and is
moderately air distended with fluid level in the proximal stomach.
There is small bowel dilatation up to 4 cm from the ligament of
Treitz distally. The terminal ileal segments are less distended but
not completely collapsed.

A transitional segment is not seen. There is contrast in the colon
without dilatation or focal wall thickening with uncomplicated
sigmoid diverticula, rectal tube again noted in place. The appendix
fills with contrast, distended 8 mm but without wall thickening or
inflammation.

Vascular/Lymphatic: Moderate aortic and iliac atherosclerosis. No
adenopathy is seen.

Reproductive: prostatic prominence, 4.8 cm transverse.

Other: Mild body wall edema is seen in the flanks and upper thighs,
to some extent over the abdominal wall eccentric to the right and
there is a moderate-sized complex umbilical fat hernia.

There are bilateral inguinal fat hernias. No incarcerated hernia.
There is minimal posterior pelvic ascites.

Musculoskeletal: No acute or significant osseous findings. There are
chronic L5 pars defects without spondylolisthesis. Degenerative
change thoracic and lumbar spine.
IMPRESSION: 1. Increased dilatation of the stomach despite adequate NGT
placement.
2. Up to 4 cm fluid dilatation of the bowel, mild contrast filling
in the stomach. No visible transition. Findings could be due to
ileus or low-grade distal SBO. The degree of bowel dilatation is no
greater than previously.
3. Bilateral, apparently nonobstructing renal, ureteral and bladder
calculi, unchanged on the right. On the left, a 5 mm calculus is
more cephalad and positioning than previously and there was
previously a 5 mm distal left ureteral calculus which is no longer
seen.
4. Small right-greater-than-left pleural effusions with adjacent
consolidation or atelectasis.
5. Umbilical and inguinal fat hernias.
6. Aortic atherosclerosis.
7. Mild body wall edema.  Minimal ascites.
8. Prominent prostate.
